# Patient Record
Sex: Female | Born: 1950 | Race: White | Hispanic: No | Marital: Married | Smoking: Never smoker
Health system: Southern US, Community
[De-identification: ages and names within clinical notes are randomized; demographics above are authoritative.]

## PROBLEM LIST (undated history)

## (undated) DIAGNOSIS — Z972 Presence of dental prosthetic device (complete) (partial): Secondary | ICD-10-CM

## (undated) DIAGNOSIS — C187 Malignant neoplasm of sigmoid colon: Secondary | ICD-10-CM

## (undated) DIAGNOSIS — M1990 Unspecified osteoarthritis, unspecified site: Secondary | ICD-10-CM

## (undated) DIAGNOSIS — Z9289 Personal history of other medical treatment: Secondary | ICD-10-CM

## (undated) DIAGNOSIS — K219 Gastro-esophageal reflux disease without esophagitis: Secondary | ICD-10-CM

## (undated) DIAGNOSIS — E119 Type 2 diabetes mellitus without complications: Secondary | ICD-10-CM

## (undated) DIAGNOSIS — N6022 Fibroadenosis of left breast: Secondary | ICD-10-CM

## (undated) DIAGNOSIS — J302 Other seasonal allergic rhinitis: Secondary | ICD-10-CM

## (undated) DIAGNOSIS — R002 Palpitations: Secondary | ICD-10-CM

## (undated) DIAGNOSIS — H269 Unspecified cataract: Secondary | ICD-10-CM

## (undated) HISTORY — DX: Type 2 diabetes mellitus without complications: E11.9

## (undated) HISTORY — PX: INGUINAL HERNIA REPAIR: SUR1180

## (undated) HISTORY — PX: APPENDECTOMY: SHX54

## (undated) HISTORY — PX: COLECTOMY: SHX59

## (undated) HISTORY — PX: HERNIA REPAIR: SHX51

## (undated) HISTORY — PX: COLONOSCOPY: SHX174

---

## 1998-01-26 ENCOUNTER — Ambulatory Visit (HOSPITAL_COMMUNITY): Admission: RE | Admit: 1998-01-26 | Discharge: 1998-01-26 | Payer: Self-pay | Admitting: Obstetrics & Gynecology

## 1998-09-14 ENCOUNTER — Other Ambulatory Visit: Admission: RE | Admit: 1998-09-14 | Discharge: 1998-09-14 | Payer: Self-pay | Admitting: Obstetrics & Gynecology

## 1999-11-01 ENCOUNTER — Other Ambulatory Visit: Admission: RE | Admit: 1999-11-01 | Discharge: 1999-11-01 | Payer: Self-pay | Admitting: Obstetrics & Gynecology

## 2000-05-09 DIAGNOSIS — Z9289 Personal history of other medical treatment: Secondary | ICD-10-CM

## 2000-05-09 HISTORY — DX: Personal history of other medical treatment: Z92.89

## 2000-06-13 ENCOUNTER — Encounter (INDEPENDENT_AMBULATORY_CARE_PROVIDER_SITE_OTHER): Payer: Self-pay | Admitting: Specialist

## 2000-06-13 ENCOUNTER — Observation Stay (HOSPITAL_COMMUNITY): Admission: RE | Admit: 2000-06-13 | Discharge: 2000-06-14 | Payer: Self-pay | Admitting: Obstetrics & Gynecology

## 2000-06-13 HISTORY — PX: MYOMECTOMY: SHX85

## 2000-06-13 HISTORY — PX: ENDOMETRIAL FULGURATION: SHX1500

## 2001-01-04 ENCOUNTER — Other Ambulatory Visit: Admission: RE | Admit: 2001-01-04 | Discharge: 2001-01-04 | Payer: Self-pay | Admitting: Obstetrics & Gynecology

## 2002-02-05 ENCOUNTER — Other Ambulatory Visit: Admission: RE | Admit: 2002-02-05 | Discharge: 2002-02-05 | Payer: Self-pay | Admitting: Obstetrics & Gynecology

## 2002-05-16 ENCOUNTER — Other Ambulatory Visit: Admission: RE | Admit: 2002-05-16 | Discharge: 2002-05-16 | Payer: Self-pay | Admitting: Obstetrics & Gynecology

## 2002-10-24 ENCOUNTER — Other Ambulatory Visit: Admission: RE | Admit: 2002-10-24 | Discharge: 2002-10-24 | Payer: Self-pay | Admitting: Obstetrics & Gynecology

## 2003-04-21 ENCOUNTER — Other Ambulatory Visit: Admission: RE | Admit: 2003-04-21 | Discharge: 2003-04-21 | Payer: Self-pay | Admitting: Obstetrics & Gynecology

## 2004-11-15 ENCOUNTER — Other Ambulatory Visit: Admission: RE | Admit: 2004-11-15 | Discharge: 2004-11-15 | Payer: Self-pay | Admitting: Obstetrics & Gynecology

## 2010-09-24 NOTE — Op Note (Signed)
[facility]  Patient:    Bethany Frederick, Bethany Frederick                     MRN: [id number] Proc. Date: 06/13/00 Adm. 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  OPERATION:                    Resection of pedunculated myoma and general endometrial curettage.  SURGEON:                      Freddy Finner, M.D.  ANESTHESIA:                   Intravenous sedation.  INTRAOPERATIVE COMPLICATIONS: None.  ESTIMATED INTRAOPERATIVE BLOOD LOSS:                   Less than 50 cc.  INDICATIONS:                  The patient is a 60 year old white married female who was having regular menses until today. She had the sudden onset of very heavy vaginal bleeding with passage of large clots and a couple of syncopal episodes at home. She was brought to the office where examination revealed an approximately 4 cm firm, nodular lesion protruding through the cervix. This was diagnosed as a pedunculated myoma, and she was brought to the hospital for removal of the myoma and D&C. Her preoperative hemoglobin was 12.   DESCRIPTION OF PROCEDURE:  She was brought to the operating room. Placed under adequate intravenous sedation. Placed in the dorsal lithotomy position. Betadine prep of the perineum and vagina was carried out in the usual fashion. Bivalve speculum was introduced. The large myoma was grasped with the ring forceps and twisted until it came off at the stalk. There was no visible residual stalk visible. General curettage was carried out with a Heaney curette, and exploration of the uterine cavity was carried out with Randall stone forceps. No additional lesions were palpable with either instrument. On completing the procedure,  hemostasis was adequate. The procedure was terminated. The patient was taken to the recovery room in good condition. She will be discharged after an overnight observation in the absence of complications. DD:  06/13/00 TD:  06/13/00 Job: [id number]

## 2011-05-10 HISTORY — PX: SHOULDER ARTHROSCOPY W/ ROTATOR CUFF REPAIR: SHX2400

## 2012-03-05 ENCOUNTER — Encounter (HOSPITAL_COMMUNITY): Payer: Self-pay | Admitting: Anesthesiology

## 2012-03-05 ENCOUNTER — Encounter (HOSPITAL_COMMUNITY): Payer: Self-pay

## 2012-03-05 ENCOUNTER — Encounter (HOSPITAL_COMMUNITY): Admission: EM | Disposition: A | Discharge status: Home / Self Care | Payer: Self-pay | Source: Home / Self Care

## 2012-03-05 ENCOUNTER — Observation Stay (HOSPITAL_COMMUNITY): Payer: Federal, State, Local not specified - PPO | Admitting: Anesthesiology

## 2012-03-05 ENCOUNTER — Observation Stay (HOSPITAL_COMMUNITY)
Admission: EM | Admit: 2012-03-05 | Discharge: 2012-03-06 | Disposition: A | Discharge status: Home / Self Care | Payer: Federal, State, Local not specified - PPO | Attending: General Surgery | Admitting: General Surgery

## 2012-03-05 DIAGNOSIS — K4030 Unilateral inguinal hernia, with obstruction, without gangrene, not specified as recurrent: Principal | ICD-10-CM | POA: Insufficient documentation

## 2012-03-05 DIAGNOSIS — K460 Unspecified abdominal hernia with obstruction, without gangrene: Secondary | ICD-10-CM

## 2012-03-05 DIAGNOSIS — K4090 Unilateral inguinal hernia, without obstruction or gangrene, not specified as recurrent: Secondary | ICD-10-CM

## 2012-03-05 HISTORY — DX: Unspecified osteoarthritis, unspecified site: M19.90

## 2012-03-05 HISTORY — PX: INSERTION OF MESH: SHX5868

## 2012-03-05 HISTORY — PX: INGUINAL HERNIA REPAIR: SHX194

## 2012-03-05 LAB — CBC WITH DIFFERENTIAL/PLATELET
Basophils Absolute: 0 10*3/uL (ref 0.0–0.1)
Eosinophils Absolute: 0.1 10*3/uL (ref 0.0–0.7)
Eosinophils Relative: 1 % (ref 0–5)
Lymphocytes Relative: 19 % (ref 12–46)
MCV: 86.8 fL (ref 78.0–100.0)
Platelets: 159 10*3/uL (ref 150–400)
RDW: 14.2 % (ref 11.5–15.5)
WBC: 6.5 10*3/uL (ref 4.0–10.5)

## 2012-03-05 LAB — COMPREHENSIVE METABOLIC PANEL
ALT: 19 U/L (ref 0–35)
AST: 17 U/L (ref 0–37)
Calcium: 9.2 mg/dL (ref 8.4–10.5)
Sodium: 139 mEq/L (ref 135–145)
Total Protein: 6.3 g/dL (ref 6.0–8.3)

## 2012-03-05 SURGERY — REPAIR, HERNIA, INGUINAL, INCARCERATED
Anesthesia: General | Site: Groin

## 2012-03-05 MED ORDER — BUPIVACAINE HCL 0.25 % IJ SOLN
INTRAMUSCULAR | Status: DC | PRN
  Administered 2012-03-05: 6 mL

## 2012-03-05 MED ORDER — HYDROMORPHONE HCL PF 1 MG/ML IJ SOLN: 1.0000 mg | INTRAMUSCULAR | Status: DC | PRN

## 2012-03-05 MED ORDER — HYDROMORPHONE HCL PF 1 MG/ML IJ SOLN
1.0000 mg | Freq: Once | INTRAMUSCULAR | Status: AC
  Administered 2012-03-05: 1 mg via INTRAVENOUS
  Filled 2012-03-05: qty 1

## 2012-03-05 MED ORDER — DEXTROSE IN LACTATED RINGERS 5 % IV SOLN
INTRAVENOUS | Status: DC
  Administered 2012-03-05: 1000 mL via INTRAVENOUS
  Administered 2012-03-06: 04:00:00 via INTRAVENOUS

## 2012-03-05 MED ORDER — ONDANSETRON HCL 4 MG PO TABS: 4.0000 mg | ORAL_TABLET | Freq: Four times a day (QID) | ORAL | Status: DC | PRN

## 2012-03-05 MED ORDER — 0.9 % SODIUM CHLORIDE (POUR BTL) OPTIME
TOPICAL | Status: DC | PRN
  Administered 2012-03-05: 1000 mL

## 2012-03-05 MED ORDER — OXYCODONE HCL 5 MG/5ML PO SOLN: 5.0000 mg | Freq: Once | ORAL | Status: DC | PRN

## 2012-03-05 MED ORDER — CEFAZOLIN SODIUM-DEXTROSE 2-3 GM-% IV SOLR
2.0000 g | Freq: Three times a day (TID) | INTRAVENOUS | Status: DC
  Administered 2012-03-05 – 2012-03-06 (×2): 2 g via INTRAVENOUS
  Filled 2012-03-05 (×5): qty 50

## 2012-03-05 MED ORDER — CEFAZOLIN SODIUM-DEXTROSE 2-3 GM-% IV SOLR
INTRAVENOUS | Status: DC | PRN
  Administered 2012-03-05: 2 g via INTRAVENOUS

## 2012-03-05 MED ORDER — OXYCODONE-ACETAMINOPHEN 5-325 MG PO TABS: 1.0000 | ORAL_TABLET | ORAL | Status: DC | PRN

## 2012-03-05 MED ORDER — BUPIVACAINE HCL (PF) 0.25 % IJ SOLN
INTRAMUSCULAR | Status: AC
  Filled 2012-03-05: qty 30

## 2012-03-05 MED ORDER — HYDROMORPHONE HCL PF 1 MG/ML IJ SOLN: 0.2500 mg | INTRAMUSCULAR | Status: DC | PRN

## 2012-03-05 MED ORDER — PROMETHAZINE HCL 25 MG/ML IJ SOLN: 6.2500 mg | INTRAMUSCULAR | Status: DC | PRN

## 2012-03-05 MED ORDER — KETOROLAC TROMETHAMINE 60 MG/2ML IM SOLN
INTRAMUSCULAR | Status: DC | PRN
  Administered 2012-03-05: 30 mg via INTRAMUSCULAR

## 2012-03-05 MED ORDER — SUCCINYLCHOLINE CHLORIDE 20 MG/ML IJ SOLN
INTRAMUSCULAR | Status: DC | PRN
  Administered 2012-03-05: 140 mg via INTRAVENOUS

## 2012-03-05 MED ORDER — ONDANSETRON HCL 4 MG/2ML IJ SOLN
INTRAMUSCULAR | Status: DC | PRN
  Administered 2012-03-05: 4 mg via INTRAVENOUS

## 2012-03-05 MED ORDER — ONDANSETRON HCL 4 MG/2ML IJ SOLN
4.0000 mg | INTRAMUSCULAR | Status: DC | PRN
  Administered 2012-03-05: 4 mg via INTRAVENOUS
  Filled 2012-03-05: qty 2

## 2012-03-05 MED ORDER — CEFAZOLIN SODIUM-DEXTROSE 2-3 GM-% IV SOLR
INTRAVENOUS | Status: AC
  Filled 2012-03-05: qty 50

## 2012-03-05 MED ORDER — LIDOCAINE HCL (CARDIAC) 20 MG/ML IV SOLN
INTRAVENOUS | Status: DC | PRN
  Administered 2012-03-05: 100 mg via INTRAVENOUS

## 2012-03-05 MED ORDER — PHENYLEPHRINE HCL 10 MG/ML IJ SOLN
INTRAMUSCULAR | Status: DC | PRN
  Administered 2012-03-05 (×2): 40 ug via INTRAVENOUS

## 2012-03-05 MED ORDER — NEOSTIGMINE METHYLSULFATE 1 MG/ML IJ SOLN
INTRAMUSCULAR | Status: DC | PRN
  Administered 2012-03-05: 4 mg via INTRAVENOUS

## 2012-03-05 MED ORDER — OXYCODONE HCL 5 MG PO TABS: 5.0000 mg | ORAL_TABLET | Freq: Once | ORAL | Status: DC | PRN

## 2012-03-05 MED ORDER — PROPOFOL 10 MG/ML IV BOLUS
INTRAVENOUS | Status: DC | PRN
  Administered 2012-03-05: 160 mg via INTRAVENOUS

## 2012-03-05 MED ORDER — MORPHINE SULFATE 4 MG/ML IJ SOLN
8.0000 mg | Freq: Once | INTRAMUSCULAR | Status: AC
  Administered 2012-03-05: 8 mg via INTRAVENOUS
  Filled 2012-03-05: qty 2

## 2012-03-05 MED ORDER — HYDROMORPHONE HCL PF 1 MG/ML IJ SOLN: 0.5000 mg | INTRAMUSCULAR | Status: DC | PRN

## 2012-03-05 MED ORDER — ONDANSETRON HCL 4 MG/2ML IJ SOLN: 4.0000 mg | Freq: Four times a day (QID) | INTRAMUSCULAR | Status: DC | PRN

## 2012-03-05 MED ORDER — MIDAZOLAM HCL 5 MG/5ML IJ SOLN
INTRAMUSCULAR | Status: DC | PRN
  Administered 2012-03-05: 2 mg via INTRAVENOUS

## 2012-03-05 MED ORDER — LACTATED RINGERS IV SOLN
INTRAVENOUS | Status: DC | PRN
  Administered 2012-03-05 (×2): via INTRAVENOUS

## 2012-03-05 MED ORDER — GLYCOPYRROLATE 0.2 MG/ML IJ SOLN
INTRAMUSCULAR | Status: DC | PRN
  Administered 2012-03-05: 0.2 mg via INTRAVENOUS
  Administered 2012-03-05: 0.6 mg via INTRAVENOUS

## 2012-03-05 MED ORDER — PANTOPRAZOLE SODIUM 40 MG IV SOLR
40.0000 mg | Freq: Every day | INTRAVENOUS | Status: DC
  Administered 2012-03-05: 40 mg via INTRAVENOUS
  Filled 2012-03-05 (×2): qty 40

## 2012-03-05 MED ORDER — EPHEDRINE SULFATE 50 MG/ML IJ SOLN
INTRAMUSCULAR | Status: DC | PRN
  Administered 2012-03-05 (×2): 10 mg via INTRAVENOUS

## 2012-03-05 MED ORDER — FENTANYL CITRATE 0.05 MG/ML IJ SOLN
INTRAMUSCULAR | Status: DC | PRN
  Administered 2012-03-05 (×5): 50 ug via INTRAVENOUS

## 2012-03-05 MED ORDER — LACTATED RINGERS IV SOLN
INTRAVENOUS | Status: DC
  Administered 2012-03-05: 11:00:00 via INTRAVENOUS

## 2012-03-05 MED ORDER — ROCURONIUM BROMIDE 100 MG/10ML IV SOLN
INTRAVENOUS | Status: DC | PRN
  Administered 2012-03-05: 40 mg via INTRAVENOUS

## 2012-03-05 SURGICAL SUPPLY — 59 items
ADH SKN CLS APL DERMABOND .7 (GAUZE/BANDAGES/DRESSINGS) ×2
APL SKNCLS STERI-STRIP NONHPOA (GAUZE/BANDAGES/DRESSINGS)
BENZOIN TINCTURE PRP APPL 2/3 (GAUZE/BANDAGES/DRESSINGS) IMPLANT
BLADE SURG 15 STRL LF DISP TIS (BLADE) ×2 IMPLANT
BLADE SURG 15 STRL SS (BLADE) ×3
BLADE SURG ROTATE 9660 (MISCELLANEOUS) ×1 IMPLANT
CHLORAPREP W/TINT 26ML (MISCELLANEOUS) ×3 IMPLANT
CLOTH BEACON ORANGE TIMEOUT ST (SAFETY) ×3 IMPLANT
COVER SURGICAL LIGHT HANDLE (MISCELLANEOUS) ×3 IMPLANT
DECANTER SPIKE VIAL GLASS SM (MISCELLANEOUS) ×2 IMPLANT
DERMABOND ADVANCED (GAUZE/BANDAGES/DRESSINGS) ×1
DERMABOND ADVANCED .7 DNX12 (GAUZE/BANDAGES/DRESSINGS) ×2 IMPLANT
DRAIN PENROSE 1/2X12 LTX STRL (WOUND CARE) IMPLANT
DRAPE LAPAROSCOPIC ABDOMINAL (DRAPES) ×1 IMPLANT
DRAPE LAPAROTOMY TRNSV 102X78 (DRAPE) IMPLANT
DRAPE UTILITY 15X26 W/TAPE STR (DRAPE) ×6 IMPLANT
DRSG TEGADERM 4X4.75 (GAUZE/BANDAGES/DRESSINGS) ×2 IMPLANT
ELECT CAUTERY BLADE 6.4 (BLADE) ×3 IMPLANT
ELECT REM PT RETURN 9FT ADLT (ELECTROSURGICAL) ×3
ELECTRODE REM PT RTRN 9FT ADLT (ELECTROSURGICAL) ×2 IMPLANT
GAUZE SPONGE 4X4 16PLY XRAY LF (GAUZE/BANDAGES/DRESSINGS) ×5 IMPLANT
GLOVE BIO SURGEON STRL SZ7.5 (GLOVE) ×3 IMPLANT
GLOVE BIOGEL PI IND STRL 7.0 (GLOVE) ×1 IMPLANT
GLOVE BIOGEL PI IND STRL 8 (GLOVE) ×2 IMPLANT
GLOVE BIOGEL PI INDICATOR 7.0 (GLOVE) ×1
GLOVE BIOGEL PI INDICATOR 8 (GLOVE) ×1
GLOVE SURG SS PI 7.0 STRL IVOR (GLOVE) ×2 IMPLANT
GOWN STRL NON-REIN LRG LVL3 (GOWN DISPOSABLE) ×6 IMPLANT
KIT BASIN OR (CUSTOM PROCEDURE TRAY) ×3 IMPLANT
KIT ROOM TURNOVER OR (KITS) ×3 IMPLANT
MESH ULTRAPRO 6X6 15CM15CM (Mesh General) ×2 IMPLANT
NDL HYPO 25GX1X1/2 BEV (NEEDLE) ×2 IMPLANT
NEEDLE HYPO 25GX1X1/2 BEV (NEEDLE) ×3 IMPLANT
NS IRRIG 1000ML POUR BTL (IV SOLUTION) ×3 IMPLANT
PACK SURGICAL SETUP 50X90 (CUSTOM PROCEDURE TRAY) ×3 IMPLANT
PAD ARMBOARD 7.5X6 YLW CONV (MISCELLANEOUS) ×3 IMPLANT
PENCIL BUTTON HOLSTER BLD 10FT (ELECTRODE) ×3 IMPLANT
SPECIMEN JAR SMALL (MISCELLANEOUS) IMPLANT
SPONGE GAUZE 4X4 12PLY (GAUZE/BANDAGES/DRESSINGS) ×2 IMPLANT
SPONGE INTESTINAL PEANUT (DISPOSABLE) ×2 IMPLANT
STRIP CLOSURE SKIN 1/2X4 (GAUZE/BANDAGES/DRESSINGS) IMPLANT
SUT MNCRL AB 4-0 PS2 18 (SUTURE) ×3 IMPLANT
SUT PDS AB 0 CT 36 (SUTURE) IMPLANT
SUT PROLENE 2 0 SH DA (SUTURE) ×3 IMPLANT
SUT SILK 2 0 SH (SUTURE) IMPLANT
SUT SILK 3 0 (SUTURE) ×3
SUT SILK 3-0 18XBRD TIE 12 (SUTURE) ×2 IMPLANT
SUT VIC AB 0 CT2 27 (SUTURE) IMPLANT
SUT VIC AB 2-0 SH 27 (SUTURE) ×3
SUT VIC AB 2-0 SH 27X BRD (SUTURE) ×2 IMPLANT
SUT VIC AB 3-0 SH 27 (SUTURE) ×9
SUT VIC AB 3-0 SH 27X BRD (SUTURE) ×2 IMPLANT
SUT VIC AB 3-0 SH 27XBRD (SUTURE) ×2 IMPLANT
SUT VICRYL AB 3 0 TIES (SUTURE) ×1 IMPLANT
SYR CONTROL 10ML LL (SYRINGE) ×3 IMPLANT
TOWEL OR 17X24 6PK STRL BLUE (TOWEL DISPOSABLE) ×3 IMPLANT
TOWEL OR 17X26 10 PK STRL BLUE (TOWEL DISPOSABLE) ×3 IMPLANT
TUBE CONNECTING 12X1/4 (SUCTIONS) ×1 IMPLANT
YANKAUER SUCT BULB TIP NO VENT (SUCTIONS) ×2 IMPLANT

## 2012-03-05 NOTE — Op Note (Signed)
Pre Operative Diagnosis:  Incarcerated left inguinal hernia  Post Operative Diagnosis: incarcerated left indirect hernia  Procedure: Open left inguinal hernia repair with mesh.  Surgeon: Dr. Axel Filler  Assistant: none  Anesthesia: GETA  EBL: 10 cc  Complications: none  Counts: reported as correct x 2  Findings:  Patient in the right lateral hernia with hernia sac contained mainly fluid as well as omentum. There were no signs of bowel incarceration  Indications for procedure:  The patient is a 61 year old female with a close prior history of left ankle hernia and denies abdominal pain. Patient presented ED with signs consistent with incarcerated inguinal hernia with possible small bowel incarceration. This patient taken back urgently for left inguinal hernia prepared  Details of the procedure: after the patient was consented patient was taken back to the operative patient was then placed in supine position bilateral SCDs in place. After appropriate antibiotics were confirmed, a timeout was called and all facts were verified.  A 4 cm incision was made in the left inguinal area. Dissection was carried down using Bovie cautery maintaining hemostasis down through Scarpa's fascia to the anterior abdominal wall. The hernia sac was initially encountered and external obliques were essentially obliterated secondary the chronicity of her hernia.  The hernia sac was identified and he was seen to be fluid filled. The hernia sac was incised and clear fluid was expressed there is a small amount of omentum and there was no small bowel within the hernia sac or other organs. He was ultimately identified and this was suture ligated. The ilioinguinal nerve was also identified and dissected back below the external obliques which was incised laterally and tied off using 3-0 Vicryl proximally and distally. The internal inguinal ring was reapproximated using 3-0 Vicryl in a figure-of-eight fashion to retain the  hernia sac.  Once this was done the superior and inferior external cleaned off until the Cooper's ligament was identified inferiorly and medially. At this time a piece of ultra Pro mesh was cut to shape proximally and 6 cm x 2 cm. A total Prolene was used to anchor the stitch at Cooper's ligament and each end of the Prolene was then run along the shelving edge of the external oblique and along Poupart's ligament. The tails were then tucked underneath the external oblique. The external oblique was reapproximated with 3-0 Vicryl in running fashion.  1/4% Marcaine was used to infiltrate the subcutaneous and fascial area.The fascia was reapproximated a 3-0 Vicryl in a interrupted fashion.  The skin was reapproximated with 4 Monocryl in subcuticular fashion. The skin was then dressed with Dermabond..  The patient was taken to the recovery room in stable condition.

## 2012-03-05 NOTE — ED Provider Notes (Signed)
Medical screening examination/treatment/procedure(s) were conducted as a shared visit with non-physician practitioner(s) and myself.  I personally evaluated the patient during the encounter    Nelia Shi, MD 03/05/12 [phone]

## 2012-03-05 NOTE — Preoperative (Signed)
Beta Blockers   Reason not to administer Beta Blockers:Not Applicable 

## 2012-03-05 NOTE — Anesthesia Procedure Notes (Signed)
Performed by: Aarianna Hoadley M       

## 2012-03-05 NOTE — ED Provider Notes (Signed)
History     CSN: [id number]  Arrival date & time 03/05/12  4098   First MD Initiated Contact with Patient 03/05/12 [phone]      Chief Complaint  Patient presents with  . Abdominal Injury    (Consider location/radiation/quality/duration/timing/severity/associated sxs/prior treatment) HPI Comments: 61 yo female with a history of left inguinal hernia, appendectomy and removal of pedunculated myoma presents to the emergency department with chief complaint of lower abdominal pain.  Onset of symptoms began acutely this morning around 630 a.m.  Pain is primarily located in the left lower quadrant without radiation and rated as severe 10/10. Pain described as sharp/dull and constant. Associated symptoms include nausea.  Last bowel movement was this morning.  Patient denies emesis, fever, night sweats, chills, trauma, melena, syncope, dizziness, lower back pain, chest pain, shortness of breath, dysuria, vaginal bleeding or vaginal discharge.  The history is provided by the patient.    History reviewed. No pertinent past medical history.  History reviewed. No pertinent past surgical history.  History reviewed. No pertinent family history.  History  Substance Use Topics  . Smoking status: Never Smoker   . Smokeless tobacco: Not on file  . Alcohol Use: Yes    OB History    Grav Para Term Preterm Abortions TAB SAB Ect Mult Living                  Review of Systems  Constitutional: Negative for fever, chills and appetite change.  HENT: Negative for congestion.   Eyes: Negative for visual disturbance.  Respiratory: Negative for shortness of breath.   Cardiovascular: Negative for chest pain and leg swelling.  Gastrointestinal: Positive for nausea and abdominal pain. Negative for vomiting, diarrhea, constipation, blood in stool and anal bleeding.  Genitourinary: Negative for dysuria, urgency and frequency.  Musculoskeletal: Negative for back pain.  Neurological: Negative for dizziness,  syncope, weakness, light-headedness, numbness and headaches.  Psychiatric/Behavioral: Negative for confusion.  All other systems reviewed and are negative.    Allergies  Review of patient's allergies indicates no known allergies.  Home Medications  No current outpatient prescriptions on file.  BP 124/63  Pulse 66  Temp 97.7 F (36.5 C) (Oral)  Resp 14  SpO2 94%  Physical Exam  Constitutional: She is oriented to person, place, and time. She appears well-developed and well-nourished. No distress.  HENT:  Head: Normocephalic and atraumatic.  Mouth/Throat: Oropharynx is clear and moist. No oropharyngeal exudate.  Eyes: Conjunctivae normal and EOM are normal. Pupils are equal, round, and reactive to light. No scleral icterus.  Neck: Normal range of motion. Neck supple. No tracheal deviation present. No thyromegaly present.  Cardiovascular: Regular rhythm, normal heart sounds and intact distal pulses.        Tachycardia   Pulmonary/Chest: Effort normal and breath sounds normal. No stridor. No respiratory distress. She has no wheezes.  Abdominal: Soft. A hernia is present. Hernia confirmed positive in the left inguinal area.         Tennis ball sized mass in the inguinal area, c/w hernia  Musculoskeletal: Normal range of motion. She exhibits no edema and no tenderness.  Neurological: She is alert and oriented to person, place, and time. Coordination normal.  Skin: Skin is warm and dry. No rash noted. She is not diaphoretic. No erythema. No pallor.  Psychiatric: She has a normal mood and affect. Her behavior is normal.    ED Course  Hernia reduction Date/Time: 03/05/2012 8:22 AM Performed by: Jaci Carrel Authorized by: Drue Novel,  Shepard Keltz Consent: Verbal consent obtained. Risks and benefits: risks, benefits and alternatives were discussed Consent given by: patient Patient understanding: patient states understanding of the procedure being performed Patient consent: the patient's  understanding of the procedure matches consent given Patient identity confirmed: verbally with patient and arm band Local anesthesia used: no Patient sedated: no Comments: Unable to reduce hernia, incarcerated. No evidence of strangulation. Will consult general surgery.    (including critical care time)  Labs Reviewed  COMPREHENSIVE METABOLIC PANEL - Abnormal; Notable for the following:    Glucose, Bld 127 (*)     Alkaline Phosphatase 147 (*)     All other components within normal limits  CBC WITH DIFFERENTIAL  URINALYSIS, ROUTINE W REFLEX MICROSCOPIC   No results found.  EKG: normal EKG, normal sinus rhythm, there are no previous tracings available for comparison, left axis deviation.  No diagnosis found.  Consult Surgery: Will see in ED for repeat attempt of hernia reduction vs OR. Pt kept NPO and pain managed  MDM  Incarcerated hernia  61 yo w acute onset of abd pain & hx of left inguinal hernia. Unable to reduce in ER, will be admitted by surgery. The patient appears reasonably stabilized for admission considering the current resources, flow, and capabilities available in the ED at this time, and I doubt any other [facility] requiring further screening and/or treatment in the ED prior to admission. Pt seen w Radford Pax who agrees w plan.          Jaci Carrel, [place] 03/05/12 [phone]

## 2012-03-05 NOTE — H&P (Signed)
Bethany Frederick is an 61 y.o. female.   Chief Complaint: abdominal pain left inguinal hernia HPI: the patient 66-year-old female with a two-week history of hernia. He states that this morning she began abdominal pain was generalized with a bulge in the left side. Patient states the hernia previously never been incarcerated was first occurrence. Patient had some nausea with no vomiting, and no blood per rectum.  History reviewed. No pertinent past medical history.  History reviewed. No pertinent past surgical history.  History reviewed. No pertinent family history. Social History:  reports that she has never smoked. She does not have any smokeless tobacco history on file. She reports that she drinks alcohol. She reports that she does not use illicit drugs.  Allergies: No Known Allergies   (Not in a hospital admission)  Results for orders placed during the hospital encounter of 03/05/12 (from the past 48 hour(s))  CBC WITH DIFFERENTIAL     Status: Normal   Collection Time   03/05/12  8:09 AM      Component Value Range Comment   WBC 6.5  4.0 - 10.5 K/uL    RBC 4.69  3.87 - 5.11 MIL/uL    Hemoglobin 13.6  12.0 - 15.0 g/dL    HCT 16.1  09.6 - 04.5 %    MCV 86.8  78.0 - 100.0 fL    MCH 29.0  26.0 - 34.0 pg    MCHC 33.4  30.0 - 36.0 g/dL    RDW 40.9  81.1 - 91.4 %    Platelets 159  150 - 400 K/uL    Neutrophils Relative 73  43 - 77 %    Neutro Abs 4.7  1.7 - 7.7 K/uL    Lymphocytes Relative 19  12 - 46 %    Lymphs Abs 1.2  0.7 - 4.0 K/uL    Monocytes Relative 7  3 - 12 %    Monocytes Absolute 0.5  0.1 - 1.0 K/uL    Eosinophils Relative 1  0 - 5 %    Eosinophils Absolute 0.1  0.0 - 0.7 K/uL    Basophils Relative 1  0 - 1 %    Basophils Absolute 0.0  0.0 - 0.1 K/uL    No results found.  Review of Systems  Constitutional: Negative for fever.  HENT: Negative.   Eyes: Negative.   Respiratory: Negative.   Cardiovascular: Negative.   Gastrointestinal: Positive for nausea and  abdominal pain. Negative for diarrhea.  Musculoskeletal: Negative.   Skin: Negative.   Neurological: Negative.     Blood pressure 124/63, pulse 66, temperature 97.7 F (36.5 C), temperature source Oral, resp. rate 14, SpO2 94.00%. Physical Exam  Constitutional: She is oriented to person, place, and time. She appears well-developed and well-nourished.  HENT:  Head: Normocephalic and atraumatic.  Eyes: Conjunctivae normal and EOM are normal. Pupils are equal, round, and reactive to light.  Neck: Normal range of motion. Neck supple.  Cardiovascular: Normal rate and regular rhythm.   Respiratory: Effort normal.  GI: Soft. Bowel sounds are normal. A hernia is present. Hernia confirmed positive in the left inguinal area (incarcerated). Hernia confirmed negative in the right inguinal area.  Musculoskeletal: Normal range of motion.  Neurological: She is alert and oriented to person, place, and time.     Assessment/Plan 61 year old female with incarcerated left inguinal hernia.  1. The proceed to the operating room for left inguinal hernia repair with possible small bowel resection. 2.All risks and benefits were discussed  with the patient, to generally include infection, bleeding, damage to surrounding structures, and recurrence. Alternatives were offered and described.  All questions were answered and the patient voiced understanding of the procedure and wishes to proceed at this point.   Marigene Ehlers., Ehab Humber 03/05/2012, 8:44 AM

## 2012-03-05 NOTE — ED Notes (Signed)
Pt with c/o sudden onset lower abdominal pain with nausea

## 2012-03-05 NOTE — Transfer of Care (Signed)
Immediate Anesthesia Transfer of Care Note  Patient: Bethany Frederick [facility]  Procedure(s) Performed: Procedure(s) (LRB) with comments: HERNIA REPAIR INGUINAL INCARCERATED (N/A) INSERTION OF MESH (Left)  Patient Location: PACU  Anesthesia Type:General  Level of Consciousness: awake, alert  and oriented  Airway & Oxygen Therapy: Patient Spontanous Breathing  Post-op Assessment: Report given to PACU RN  Post vital signs: stable  Complications: No apparent anesthesia complications

## 2012-03-05 NOTE — Anesthesia Preprocedure Evaluation (Addendum)
Anesthesia Evaluation  Patient identified by MRN, date of birth, ID band Patient awake    Reviewed: Allergy & Precautions, H&P , NPO status , Patient's Chart, lab work & pertinent test results, reviewed documented beta blocker date and time   Airway Mallampati: III TM Distance: >3 FB Neck ROM: Full    Dental  (+) Teeth Intact and Dental Advisory Given   Pulmonary neg pulmonary ROS,  breath sounds clear to auscultation  Pulmonary exam normal       Cardiovascular Exercise Tolerance: Good Rhythm:Regular Rate:Normal     Neuro/Psych negative neurological ROS     GI/Hepatic negative GI ROS, Neg liver ROS,   Endo/Other  negative endocrine ROS  Renal/GU negative Renal ROS  negative genitourinary   Musculoskeletal negative musculoskeletal ROS (+)   Abdominal (+)  Abdomen: soft. Bowel sounds: normal.  Peds  Hematology negative hematology ROS (+)   Anesthesia Other Findings   Reproductive/Obstetrics negative OB ROS                        Anesthesia Physical Anesthesia Plan  ASA: II and Emergent  Anesthesia Plan: General   Post-op Pain Management:    Induction: Intravenous  Airway Management Planned: Oral ETT  Additional Equipment:   Intra-op Plan:   Post-operative Plan: Extubation in OR  Informed Consent: I have reviewed the patients History and Physical, chart, labs and discussed the procedure including the risks, benefits and alternatives for the proposed anesthesia with the patient or authorized representative who has indicated his/her understanding and acceptance.   Dental advisory given  Plan Discussed with: CRNA, Anesthesiologist and Surgeon  Anesthesia Plan Comments:         Anesthesia Quick Evaluation

## 2012-03-05 NOTE — Anesthesia Postprocedure Evaluation (Signed)
Anesthesia Post Note  Patient: Bethany Frederick [facility]  Procedure(s) Performed: Procedure(s) (LRB): HERNIA REPAIR INGUINAL INCARCERATED (N/A) INSERTION OF MESH (Left)  Anesthesia type: general  Patient location: PACU  Post pain: Pain level controlled  Post assessment: Patient's Cardiovascular Status Stable  Last Vitals:  Filed Vitals:   03/05/12 1448  BP: 126/62  Pulse: 90  Temp:   Resp: 14    Post vital signs: Reviewed and stable  Level of consciousness: sedated  Complications: No apparent anesthesia complications

## 2012-03-06 LAB — CBC
Hemoglobin: 12 g/dL (ref 12.0–15.0)
MCH: 28.9 pg (ref 26.0–34.0)
RBC: 4.15 MIL/uL (ref 3.87–5.11)

## 2012-03-06 MED ORDER — OXYCODONE-ACETAMINOPHEN 5-325 MG PO TABS: 1.0000 | ORAL_TABLET | ORAL | Status: DC | PRN

## 2012-03-06 NOTE — Progress Notes (Signed)
Patient discharged to home in care of spouse. Medications and instructions reviewed with patient and spouse with no questions. IV d/c'd with cath intact Dressing CDI. Incision CDI. Assessment unchanged from this am. Patient is to follow up with Dr. Derrell Lolling in 3 weeks.

## 2012-03-06 NOTE — Discharge Summary (Signed)
  Patient ID: Bethany Frederick MRN: [id number] DOB/AGE: 62/05/1950 62 y.o.  Admit date: 03/05/2012 Discharge date: 03/06/2012  Procedures: open left inguinal hernia repair with mesh  Consults: None  Reason for Admission: This is a 61 yo female who came to [facility] with an incarcerated left inguinal hernia.  She was admitted for further care.  Admission Diagnoses:  1. Left incarcerated inguinal hernia  Hospital Course: The patient was admitted and taken to the operating room where she underwent an open left inguinal hernia repair with mesh.  She tolerated the procedure well.  By POD# 1, her pain was well controlled and she was tolerating a regular diet.  She was stable for dc home.  PE: Abd: soft, appropriately tender around incision, +BS, ND, incision c/d/i with dermabond  Discharge Diagnoses:  1. Incarcerated left inguinal hernia 2. S/p repair with mesh  Discharge Medications:   Medication List     As of 03/06/2012  8:08 AM    TAKE these medications         estradiol 1 MG tablet   Commonly known as: ESTRACE   Take 1 mg by mouth daily.      ibuprofen 200 MG tablet   Commonly known as: ADVIL,MOTRIN   Take 400 mg by mouth every 6 (six) hours as needed. For pain      oxyCODONE-acetaminophen 5-325 MG per tablet   Commonly known as: PERCOCET/ROXICET   Take 1-2 tablets by mouth every 4 (four) hours as needed.        Discharge Instructions:     Follow-up Information    Follow up with Lajean Saver, MD. Schedule an appointment as soon as possible for a visit in 3 weeks.   Contact information:   [address] [place] [address] [phone]          Signed: Letha Cape 03/06/2012, 8:08 AM

## 2012-03-08 ENCOUNTER — Encounter (HOSPITAL_COMMUNITY): Payer: Self-pay | Admitting: General Surgery

## 2012-03-27 ENCOUNTER — Ambulatory Visit (INDEPENDENT_AMBULATORY_CARE_PROVIDER_SITE_OTHER): Payer: Federal, State, Local not specified - PPO | Admitting: General Surgery

## 2012-03-27 ENCOUNTER — Encounter (INDEPENDENT_AMBULATORY_CARE_PROVIDER_SITE_OTHER): Payer: Self-pay | Admitting: General Surgery

## 2012-03-27 VITALS — BP 128/72 | HR 81 | Temp 98.1°F | Resp 18 | Ht 67.0 in | Wt 167.6 lb

## 2012-03-27 DIAGNOSIS — Z8719 Personal history of other diseases of the digestive system: Secondary | ICD-10-CM

## 2012-03-27 DIAGNOSIS — Z9889 Other specified postprocedural states: Secondary | ICD-10-CM

## 2012-03-27 NOTE — Progress Notes (Signed)
Patient ID: Bethany Frederick, female   DOB: January 09, 1951, 61 y.o.   MRN: [id number] The patient is a 61 year old female status post left repair who was seen in the ED for incarcerated left he will hernia repair. Patient is to do well postoperatively. She's been tolerating a regular diet and having no pain at this time. She does describe an area that is insensate over the incisional area   on exam: Wounds clean dry and intact there is no hernia  Plan: Status post left inguinal hernia with Mesh. Recommend  15-20 pound weight limit for the next 2 weeks. Patient fell apparent

## 2012-05-22 ENCOUNTER — Other Ambulatory Visit: Payer: Self-pay | Admitting: Obstetrics & Gynecology

## 2012-05-22 DIAGNOSIS — R928 Other abnormal and inconclusive findings on diagnostic imaging of breast: Secondary | ICD-10-CM

## 2012-06-01 ENCOUNTER — Ambulatory Visit
Admission: RE | Admit: 2012-06-01 | Discharge: 2012-06-01 | Disposition: A | Discharge status: Home / Self Care | Payer: Federal, State, Local not specified - PPO | Source: Ambulatory Visit | Attending: Obstetrics & Gynecology | Admitting: Obstetrics & Gynecology

## 2012-06-01 DIAGNOSIS — R928 Other abnormal and inconclusive findings on diagnostic imaging of breast: Secondary | ICD-10-CM

## 2012-06-23 ENCOUNTER — Other Ambulatory Visit: Payer: Self-pay

## 2013-03-14 ENCOUNTER — Other Ambulatory Visit: Payer: Self-pay

## 2014-05-09 HISTORY — PX: BREAST BIOPSY: SHX20

## 2014-10-09 DIAGNOSIS — H6242 Otitis externa in other diseases classified elsewhere, left ear: Secondary | ICD-10-CM | POA: Diagnosis not present

## 2014-10-09 DIAGNOSIS — B369 Superficial mycosis, unspecified: Secondary | ICD-10-CM | POA: Diagnosis not present

## 2014-11-04 DIAGNOSIS — Z124 Encounter for screening for malignant neoplasm of cervix: Secondary | ICD-10-CM | POA: Diagnosis not present

## 2014-11-04 DIAGNOSIS — Z6830 Body mass index (BMI) 30.0-30.9, adult: Secondary | ICD-10-CM | POA: Diagnosis not present

## 2014-11-04 DIAGNOSIS — Z1231 Encounter for screening mammogram for malignant neoplasm of breast: Secondary | ICD-10-CM | POA: Diagnosis not present

## 2014-11-04 DIAGNOSIS — Z01419 Encounter for gynecological examination (general) (routine) without abnormal findings: Secondary | ICD-10-CM | POA: Diagnosis not present

## 2014-11-11 ENCOUNTER — Other Ambulatory Visit: Payer: Self-pay | Admitting: Obstetrics & Gynecology

## 2014-11-11 DIAGNOSIS — R928 Other abnormal and inconclusive findings on diagnostic imaging of breast: Secondary | ICD-10-CM

## 2014-12-11 ENCOUNTER — Ambulatory Visit
Admission: RE | Admit: 2014-12-11 | Discharge: 2014-12-11 | Disposition: A | Discharge status: Home / Self Care | Payer: Federal, State, Local not specified - PPO | Source: Ambulatory Visit | Attending: Obstetrics & Gynecology | Admitting: Obstetrics & Gynecology

## 2014-12-11 ENCOUNTER — Other Ambulatory Visit: Payer: Self-pay | Admitting: Obstetrics & Gynecology

## 2014-12-11 DIAGNOSIS — R928 Other abnormal and inconclusive findings on diagnostic imaging of breast: Secondary | ICD-10-CM

## 2014-12-19 ENCOUNTER — Ambulatory Visit
Admission: RE | Admit: 2014-12-19 | Discharge: 2014-12-19 | Disposition: A | Discharge status: Home / Self Care | Payer: Federal, State, Local not specified - PPO | Source: Ambulatory Visit | Attending: Obstetrics & Gynecology | Admitting: Obstetrics & Gynecology

## 2014-12-19 ENCOUNTER — Other Ambulatory Visit: Payer: Self-pay | Admitting: Obstetrics & Gynecology

## 2014-12-19 DIAGNOSIS — N6489 Other specified disorders of breast: Secondary | ICD-10-CM | POA: Diagnosis not present

## 2014-12-19 DIAGNOSIS — R928 Other abnormal and inconclusive findings on diagnostic imaging of breast: Secondary | ICD-10-CM

## 2014-12-22 DIAGNOSIS — H40023 Open angle with borderline findings, high risk, bilateral: Secondary | ICD-10-CM | POA: Diagnosis not present

## 2014-12-22 DIAGNOSIS — H04123 Dry eye syndrome of bilateral lacrimal glands: Secondary | ICD-10-CM | POA: Diagnosis not present

## 2015-01-06 ENCOUNTER — Other Ambulatory Visit: Payer: Self-pay | Admitting: General Surgery

## 2015-01-06 DIAGNOSIS — N6022 Fibroadenosis of left breast: Secondary | ICD-10-CM

## 2015-01-07 ENCOUNTER — Other Ambulatory Visit: Payer: Self-pay | Admitting: General Surgery

## 2015-01-07 DIAGNOSIS — N6022 Fibroadenosis of left breast: Secondary | ICD-10-CM

## 2015-02-07 DIAGNOSIS — N6022 Fibroadenosis of left breast: Secondary | ICD-10-CM

## 2015-02-07 HISTORY — DX: Fibroadenosis of left breast: N60.22

## 2015-02-20 DIAGNOSIS — H1132 Conjunctival hemorrhage, left eye: Secondary | ICD-10-CM | POA: Diagnosis not present

## 2015-02-20 DIAGNOSIS — H04123 Dry eye syndrome of bilateral lacrimal glands: Secondary | ICD-10-CM | POA: Diagnosis not present

## 2015-03-06 ENCOUNTER — Encounter (HOSPITAL_BASED_OUTPATIENT_CLINIC_OR_DEPARTMENT_OTHER): Payer: Self-pay | Admitting: *Deleted

## 2015-03-10 ENCOUNTER — Ambulatory Visit
Admission: RE | Admit: 2015-03-10 | Discharge: 2015-03-10 | Disposition: A | Discharge status: Home / Self Care | Payer: Federal, State, Local not specified - PPO | Source: Ambulatory Visit | Attending: General Surgery | Admitting: General Surgery

## 2015-03-10 DIAGNOSIS — N6022 Fibroadenosis of left breast: Secondary | ICD-10-CM

## 2015-03-10 DIAGNOSIS — R928 Other abnormal and inconclusive findings on diagnostic imaging of breast: Secondary | ICD-10-CM | POA: Diagnosis not present

## 2015-03-10 HISTORY — PX: BREAST LUMPECTOMY: SHX2

## 2015-03-13 ENCOUNTER — Ambulatory Visit (HOSPITAL_BASED_OUTPATIENT_CLINIC_OR_DEPARTMENT_OTHER)
Admission: RE | Admit: 2015-03-13 | Discharge: 2015-03-13 | Disposition: A | Discharge status: Home / Self Care | Payer: Federal, State, Local not specified - PPO | Source: Ambulatory Visit | Attending: General Surgery | Admitting: General Surgery

## 2015-03-13 ENCOUNTER — Encounter (HOSPITAL_BASED_OUTPATIENT_CLINIC_OR_DEPARTMENT_OTHER)
Admission: RE | Disposition: A | Discharge status: Home / Self Care | Payer: Self-pay | Source: Ambulatory Visit | Attending: General Surgery

## 2015-03-13 ENCOUNTER — Ambulatory Visit (HOSPITAL_BASED_OUTPATIENT_CLINIC_OR_DEPARTMENT_OTHER): Payer: Federal, State, Local not specified - PPO | Admitting: Anesthesiology

## 2015-03-13 ENCOUNTER — Encounter (HOSPITAL_BASED_OUTPATIENT_CLINIC_OR_DEPARTMENT_OTHER): Payer: Self-pay | Admitting: *Deleted

## 2015-03-13 ENCOUNTER — Ambulatory Visit
Admission: RE | Admit: 2015-03-13 | Discharge: 2015-03-13 | Disposition: A | Discharge status: Home / Self Care | Payer: Federal, State, Local not specified - PPO | Source: Ambulatory Visit | Attending: General Surgery | Admitting: General Surgery

## 2015-03-13 DIAGNOSIS — N6082 Other benign mammary dysplasias of left breast: Secondary | ICD-10-CM | POA: Diagnosis not present

## 2015-03-13 DIAGNOSIS — N6022 Fibroadenosis of left breast: Secondary | ICD-10-CM

## 2015-03-13 DIAGNOSIS — R928 Other abnormal and inconclusive findings on diagnostic imaging of breast: Secondary | ICD-10-CM | POA: Diagnosis not present

## 2015-03-13 DIAGNOSIS — R921 Mammographic calcification found on diagnostic imaging of breast: Secondary | ICD-10-CM | POA: Diagnosis not present

## 2015-03-13 DIAGNOSIS — N6489 Other specified disorders of breast: Secondary | ICD-10-CM | POA: Diagnosis not present

## 2015-03-13 HISTORY — DX: Presence of dental prosthetic device (complete) (partial): Z97.2

## 2015-03-13 HISTORY — DX: Fibroadenosis of left breast: N60.22

## 2015-03-13 HISTORY — PX: BREAST LUMPECTOMY WITH RADIOACTIVE SEED LOCALIZATION: SHX6424

## 2015-03-13 HISTORY — DX: Other seasonal allergic rhinitis: J30.2

## 2015-03-13 HISTORY — DX: Unspecified cataract: H26.9

## 2015-03-13 SURGERY — BREAST LUMPECTOMY WITH RADIOACTIVE SEED LOCALIZATION
Anesthesia: General | Site: Breast | Laterality: Left

## 2015-03-13 MED ORDER — CHLORHEXIDINE GLUCONATE 4 % EX LIQD: 1.0000 "application " | Freq: Once | CUTANEOUS | Status: DC

## 2015-03-13 MED ORDER — LACTATED RINGERS IV SOLN
INTRAVENOUS | Status: DC
  Administered 2015-03-13: 10 mL/h via INTRAVENOUS

## 2015-03-13 MED ORDER — LIDOCAINE HCL (CARDIAC) 20 MG/ML IV SOLN
INTRAVENOUS | Status: DC | PRN
  Administered 2015-03-13: 50 mg via INTRAVENOUS

## 2015-03-13 MED ORDER — FENTANYL CITRATE (PF) 100 MCG/2ML IJ SOLN: 50.0000 ug | INTRAMUSCULAR | Status: DC | PRN

## 2015-03-13 MED ORDER — PROPOFOL 10 MG/ML IV BOLUS
INTRAVENOUS | Status: AC
  Filled 2015-03-13: qty 20

## 2015-03-13 MED ORDER — MEPERIDINE HCL 25 MG/ML IJ SOLN
6.2500 mg | INTRAMUSCULAR | Status: DC | PRN
Start: 2015-03-13 — End: 2015-03-13

## 2015-03-13 MED ORDER — GLYCOPYRROLATE 0.2 MG/ML IJ SOLN: 0.2000 mg | Freq: Once | INTRAMUSCULAR | Status: DC | PRN

## 2015-03-13 MED ORDER — MIDAZOLAM HCL 2 MG/2ML IJ SOLN
INTRAMUSCULAR | Status: AC
  Filled 2015-03-13: qty 4

## 2015-03-13 MED ORDER — FENTANYL CITRATE (PF) 100 MCG/2ML IJ SOLN
INTRAMUSCULAR | Status: DC | PRN
  Administered 2015-03-13: 100 ug via INTRAVENOUS

## 2015-03-13 MED ORDER — CEFAZOLIN SODIUM-DEXTROSE 2-3 GM-% IV SOLR
INTRAVENOUS | Status: AC
  Filled 2015-03-13: qty 50

## 2015-03-13 MED ORDER — PROPOFOL 10 MG/ML IV BOLUS
INTRAVENOUS | Status: DC | PRN
  Administered 2015-03-13: 200 mg via INTRAVENOUS

## 2015-03-13 MED ORDER — FENTANYL CITRATE (PF) 100 MCG/2ML IJ SOLN
INTRAMUSCULAR | Status: AC
  Filled 2015-03-13: qty 4

## 2015-03-13 MED ORDER — BUPIVACAINE HCL (PF) 0.25 % IJ SOLN
INTRAMUSCULAR | Status: AC
  Filled 2015-03-13: qty 30

## 2015-03-13 MED ORDER — DEXAMETHASONE SODIUM PHOSPHATE 10 MG/ML IJ SOLN
INTRAMUSCULAR | Status: AC
  Filled 2015-03-13: qty 1

## 2015-03-13 MED ORDER — BUPIVACAINE-EPINEPHRINE (PF) 0.25% -1:200000 IJ SOLN
INTRAMUSCULAR | Status: AC
  Filled 2015-03-13: qty 120

## 2015-03-13 MED ORDER — OXYCODONE HCL 5 MG/5ML PO SOLN: 5.0000 mg | Freq: Once | ORAL | Status: DC | PRN

## 2015-03-13 MED ORDER — OXYCODONE-ACETAMINOPHEN 5-325 MG PO TABS: 1.0000 | ORAL_TABLET | ORAL | Status: DC | PRN

## 2015-03-13 MED ORDER — SCOPOLAMINE 1 MG/3DAYS TD PT72
1.0000 | MEDICATED_PATCH | Freq: Once | TRANSDERMAL | Status: DC | PRN
Start: 2015-03-13 — End: 2015-03-13

## 2015-03-13 MED ORDER — MIDAZOLAM HCL 2 MG/2ML IJ SOLN: 1.0000 mg | INTRAMUSCULAR | Status: DC | PRN

## 2015-03-13 MED ORDER — MIDAZOLAM HCL 5 MG/5ML IJ SOLN
INTRAMUSCULAR | Status: DC | PRN
  Administered 2015-03-13: 2 mg via INTRAVENOUS

## 2015-03-13 MED ORDER — HYDROMORPHONE HCL 1 MG/ML IJ SOLN: 0.2500 mg | INTRAMUSCULAR | Status: DC | PRN

## 2015-03-13 MED ORDER — DEXAMETHASONE SODIUM PHOSPHATE 4 MG/ML IJ SOLN
INTRAMUSCULAR | Status: DC | PRN
  Administered 2015-03-13: 10 mg via INTRAVENOUS

## 2015-03-13 MED ORDER — BUPIVACAINE-EPINEPHRINE (PF) 0.25% -1:200000 IJ SOLN
INTRAMUSCULAR | Status: DC | PRN
  Administered 2015-03-13: 17 mL

## 2015-03-13 MED ORDER — OXYCODONE HCL 5 MG PO TABS: 5.0000 mg | ORAL_TABLET | Freq: Once | ORAL | Status: DC | PRN

## 2015-03-13 MED ORDER — LIDOCAINE HCL (CARDIAC) 20 MG/ML IV SOLN
INTRAVENOUS | Status: AC
  Filled 2015-03-13: qty 5

## 2015-03-13 MED ORDER — ONDANSETRON HCL 4 MG/2ML IJ SOLN
INTRAMUSCULAR | Status: AC
  Filled 2015-03-13: qty 2

## 2015-03-13 MED ORDER — CEFAZOLIN SODIUM-DEXTROSE 2-3 GM-% IV SOLR
2.0000 g | INTRAVENOUS | Status: AC
  Administered 2015-03-13: 2 g via INTRAVENOUS

## 2015-03-13 SURGICAL SUPPLY — 41 items
APPLIER CLIP 9.375 MED OPEN (MISCELLANEOUS)
APR CLP MED 9.3 20 MLT OPN (MISCELLANEOUS)
BLADE SURG 15 STRL LF DISP TIS (BLADE) ×1 IMPLANT
BLADE SURG 15 STRL SS (BLADE) ×3
CANISTER SUC SOCK COL 7IN (MISCELLANEOUS) ×1 IMPLANT
CANISTER SUCT 1200ML W/VALVE (MISCELLANEOUS) ×1 IMPLANT
CHLORAPREP W/TINT 26ML (MISCELLANEOUS) ×3 IMPLANT
CLIP APPLIE 9.375 MED OPEN (MISCELLANEOUS) IMPLANT
COVER BACK TABLE 60X90IN (DRAPES) ×3 IMPLANT
COVER MAYO STAND STRL (DRAPES) ×3 IMPLANT
COVER PROBE W GEL 5X96 (DRAPES) ×3 IMPLANT
DECANTER SPIKE VIAL GLASS SM (MISCELLANEOUS) IMPLANT
DEVICE DUBIN W/COMP PLATE 8390 (MISCELLANEOUS) ×3 IMPLANT
DRAPE LAPAROSCOPIC ABDOMINAL (DRAPES) ×3 IMPLANT
DRAPE UTILITY XL STRL (DRAPES) ×3 IMPLANT
ELECT COATED BLADE 2.86 ST (ELECTRODE) ×3 IMPLANT
ELECT REM PT RETURN 9FT ADLT (ELECTROSURGICAL) ×3
ELECTRODE REM PT RTRN 9FT ADLT (ELECTROSURGICAL) ×1 IMPLANT
GLOVE BIO SURGEON STRL SZ7.5 (GLOVE) ×6 IMPLANT
GLOVE EXAM NITRILE MD LF STRL (GLOVE) ×2 IMPLANT
GLOVE SURG SS PI 7.0 STRL IVOR (GLOVE) ×2 IMPLANT
GOWN STRL REUS W/ TWL LRG LVL3 (GOWN DISPOSABLE) ×2 IMPLANT
GOWN STRL REUS W/TWL LRG LVL3 (GOWN DISPOSABLE) ×6
KIT MARKER MARGIN INK (KITS) ×3 IMPLANT
LIQUID BAND (GAUZE/BANDAGES/DRESSINGS) ×3 IMPLANT
NDL HYPO 25X1 1.5 SAFETY (NEEDLE) IMPLANT
NEEDLE HYPO 25X1 1.5 SAFETY (NEEDLE) ×3 IMPLANT
NS IRRIG 1000ML POUR BTL (IV SOLUTION) IMPLANT
PACK BASIN DAY SURGERY FS (CUSTOM PROCEDURE TRAY) ×3 IMPLANT
PENCIL BUTTON HOLSTER BLD 10FT (ELECTRODE) ×3 IMPLANT
SLEEVE SCD COMPRESS KNEE MED (MISCELLANEOUS) ×3 IMPLANT
SPONGE LAP 18X18 X RAY DECT (DISPOSABLE) ×3 IMPLANT
SUT MON AB 4-0 PC3 18 (SUTURE) ×2 IMPLANT
SUT SILK 2 0 SH (SUTURE) IMPLANT
SUT VICRYL 3-0 CR8 SH (SUTURE) ×3 IMPLANT
SYR CONTROL 10ML LL (SYRINGE) ×2 IMPLANT
TOWEL OR 17X24 6PK STRL BLUE (TOWEL DISPOSABLE) ×3 IMPLANT
TOWEL OR NON WOVEN STRL DISP B (DISPOSABLE) ×1 IMPLANT
TUBE CONNECTING 20'X1/4 (TUBING)
TUBE CONNECTING 20X1/4 (TUBING) ×1 IMPLANT
YANKAUER SUCT BULB TIP NO VENT (SUCTIONS) IMPLANT

## 2015-03-13 NOTE — Anesthesia Postprocedure Evaluation (Signed)
  Anesthesia Post-op Note  Patient: Bethany Frederick [facility]  Procedure(s) Performed: Procedure(s): BREAST LUMPECTOMY WITH RADIOACTIVE SEED LOCALIZATION (Left)  Patient Location: PACU  Anesthesia Type: General   Level of Consciousness: awake, alert  and oriented  Airway and Oxygen Therapy: Patient Spontanous Breathing  Post-op Pain: mild  Post-op Assessment: Post-op Vital signs reviewed  Post-op Vital Signs: Reviewed  Last Vitals:  Filed Vitals:   03/13/15 0900  BP: 142/83  Pulse: 75  Temp:   Resp: 22    Complications: No apparent anesthesia complications

## 2015-03-13 NOTE — Transfer of Care (Signed)
Immediate Anesthesia Transfer of Care Note  Patient: Bethany Frederick [facility]  Procedure(s) Performed: Procedure(s): BREAST LUMPECTOMY WITH RADIOACTIVE SEED LOCALIZATION (Left)  Patient Location: PACU  Anesthesia Type:General  Level of Consciousness: awake and patient cooperative  Airway & Oxygen Therapy: Patient Spontanous Breathing and Patient connected to face mask oxygen  Post-op Assessment: Report given to RN and Post -op Vital signs reviewed and stable  Post vital signs: Reviewed and stable  Last Vitals:  Filed Vitals:   03/13/15 0641  BP: 146/63  Pulse: 73  Temp: 36.7 C  Resp: 18    Complications: No apparent anesthesia complications

## 2015-03-13 NOTE — Anesthesia Procedure Notes (Signed)
Procedure Name: LMA Insertion Date/Time: 03/13/2015 7:36 AM Performed by: Toula Moos L Pre-anesthesia Checklist: Patient identified, Emergency Drugs available, Suction available and Patient being monitored Patient Re-evaluated:Patient Re-evaluated prior to inductionOxygen Delivery Method: Circle System Utilized Preoxygenation: Pre-oxygenation with 100% oxygen Intubation Type: IV induction Ventilation: Mask ventilation without difficulty LMA: LMA inserted LMA Size: 4.0 Number of attempts: 1 Airway Equipment and Method: Bite block Placement Confirmation: positive ETCO2 Tube secured with: Tape Dental Injury: Teeth and Oropharynx as per pre-operative assessment

## 2015-03-13 NOTE — Interval H&P Note (Signed)
History and Physical Interval Note:  03/13/2015 5:44 AM  Bethany Frederick  has presented today for surgery, with the diagnosis of LEFT BREAST SCLEROSING LESION  The various methods of treatment have been discussed with the patient and family. After consideration of risks, benefits and other options for treatment, the patient has consented to  Procedure(s): BREAST LUMPECTOMY WITH RADIOACTIVE SEED LOCALIZATION (Left) as a surgical intervention .  The patient's history has been reviewed, patient examined, no change in status, stable for surgery.  I have reviewed the patient's chart and labs.  Questions were answered to the patient's satisfaction.     TOTH III,Parsa Rickett S

## 2015-03-13 NOTE — Op Note (Signed)
03/13/2015  8:05 AM  PATIENT:  Bethany Frederick  64 y.o. female  PRE-OPERATIVE DIAGNOSIS:  LEFT BREAST SCLEROSING LESION  POST-OPERATIVE DIAGNOSIS:  LEFT BREAST SCLEROSING LESION  PROCEDURE:  Procedure(s): BREAST LUMPECTOMY WITH RADIOACTIVE SEED LOCALIZATION (Left)  SURGEON:  Surgeon(s) and Role:    * Jovita Kussmaul, MD - Primary  PHYSICIAN ASSISTANT:   ASSISTANTS: none   ANESTHESIA:   general  EBL:     BLOOD ADMINISTERED:none  DRAINS: none   LOCAL MEDICATIONS USED:  MARCAINE     SPECIMEN:  Source of Specimen:  left breast tissue  DISPOSITION OF SPECIMEN:  PATHOLOGY  COUNTS:  YES  TOURNIQUET:  * No tourniquets in log *  DICTATION: .Dragon Dictation   After informed consent was obtained the patient was brought to the operating room and placed in the supine position on the operating room table. After adequate induction of general anesthesia the patient's left breast was prepped with ChloraPrep, allowed to dry, and draped in usual sterile manner. Previously an I-125 seed was placed in the upper inner quadrant of the left breast to mark an area of a complex sclerosing lesion. The neoprobe was set to I-125. The area of radioactivity in the upper inner left breast was readily identified. A curvilinear incision was made with a 15 blade knife overlying the radioactivity. The incision was carried through the skin and subcutaneous drainage tissue sharply with electrocautery until the breast tissue was entered. While checking the area of radioactivity frequently a circular portion of breast tissue was excised sharply around the radioactive seed. Once the specimen was removed it was oriented with the appropriate paint colors. A specimen radiograph showed the clip and seed to be in the center of the specimen. There was no residual I-125 radioactivity in the left breast. The specimen was then sent to pathology for further evaluation. Hemostasis was achieved using the Bovie electrocautery. The  wound was irrigated with saline and infiltrated with quarter percent Marcaine. The deep layer of the wound was closed with interrupted 3-0 Vicryl stitches. The skin was then closed with interrupted 4-0 Monocryl subcuticular stitches. Dermabond dressings were applied. Patient tolerated the procedure well. At the end of the case all needle sponge and instrument counts were correct. The patient was then awakened and taken to recovery in stable condition.  PLAN OF CARE: Discharge to home after PACU  PATIENT DISPOSITION:  PACU - hemodynamically stable.   Delay start of Pharmacological VTE agent (>24hrs) due to surgical blood loss or risk of bleeding: not applicable

## 2015-03-13 NOTE — Anesthesia Preprocedure Evaluation (Signed)
Anesthesia Evaluation  Patient identified by MRN, date of birth, ID band Patient awake    Reviewed: Allergy & Precautions, NPO status , Patient's Chart, lab work & pertinent test results  Airway Mallampati: I  TM Distance: >3 FB Neck ROM: Full    Dental  (+) Teeth Intact, Dental Advisory Given   Pulmonary  breath sounds clear to auscultation        Cardiovascular Rhythm:Regular Rate:Normal     Neuro/Psych    GI/Hepatic   Endo/Other    Renal/GU      Musculoskeletal   Abdominal   Peds  Hematology   Anesthesia Other Findings   Reproductive/Obstetrics                             Anesthesia Physical Anesthesia Plan  ASA: II  Anesthesia Plan: General   Post-op Pain Management:    Induction: Intravenous  Airway Management Planned: LMA  Additional Equipment:   Intra-op Plan:   Post-operative Plan: Extubation in OR  Informed Consent: I have reviewed the patients History and Physical, chart, labs and discussed the procedure including the risks, benefits and alternatives for the proposed anesthesia with the patient or authorized representative who has indicated his/her understanding and acceptance.   Dental advisory given  Plan Discussed with: CRNA, Anesthesiologist and Surgeon  Anesthesia Plan Comments:         Anesthesia Quick Evaluation  

## 2015-03-13 NOTE — H&P (Signed)
Bethany Frederick 01/06/2015 11:19 AM Location: [facility] Patient #: [id number] DOB: 01-May-1951 Married / Language: English / Race: White Female   History of Present Illness Sammuel Hines. Marlou Starks MD; 01/06/2015 12:01 PM) Patient words: [place] breast f/u.  The patient is a 64 year old female who presents with a breast mass. We are asked to see the patient in consultation by Dr. Ammie Ferrier to evaluate her for a left breast mass. The patient is a 64 year old white female who recently went for a routine screening mammogram. At that time she was found to have an area of distortion in the upper inner left breast. This was biopsied and came back as a complex sclerosing lesion. She denies any breast pain or discharge from the nipple. She does not take any hormone replacement. Her only breast cancer history in her family is in a paternal aunt.   Other Problems Marjean Donna, CMA; 01/06/2015 11:19 AM) Arthritis Back Pain Gastroesophageal Reflux Disease Inguinal Hernia  Past Surgical History Marjean Donna, CMA; 01/06/2015 11:19 AM) Appendectomy Breast Biopsy Left. Open Inguinal Hernia Surgery Left. Shoulder Surgery Left.  Diagnostic Studies History Marjean Donna, CMA; 01/06/2015 11:19 AM) Colonoscopy never Mammogram within last year Pap Smear 1-5 years ago  Allergies Marjean Donna, CMA; 01/06/2015 11:20 AM) No Known Drug Allergies08/30/2016  Medication History Marjean Donna, CMA; 01/06/2015 11:20 AM) Advil (100MG  Tablet Chewable, Oral) Active. Tylenol (325MG  Tablet, Oral) Active. Medications Reconciled  Pregnancy / Birth History Marjean Donna, CMA; 01/06/2015 11:19 AM) Age at menarche 10 years. Age of menopause 51-55 Contraceptive History Oral contraceptives. Gravida 1 Maternal age 37-40 Para 1    Review of Systems ([place]; 01/06/2015 11:19 AM) General Not Present- Appetite Loss, Chills, Fatigue, Fever, Night Sweats, Weight Gain and Weight  Loss. Skin Not Present- Change in Wart/Mole, Dryness, Hives, Jaundice, New Lesions, Non-Healing Wounds, Rash and Ulcer. HEENT Present- Wears glasses/contact lenses. Not Present- Earache, Hearing Loss, Hoarseness, Nose Bleed, Oral Ulcers, Ringing in the Ears, Seasonal Allergies, Sinus Pain, Sore Throat, Visual Disturbances and Yellow Eyes. Respiratory Not Present- Bloody sputum, Chronic Cough, Difficulty Breathing, Snoring and Wheezing. Breast Not Present- Breast Mass, Breast Pain, Nipple Discharge and Skin Changes. Cardiovascular Present- Swelling of Extremities. Not Present- Chest Pain, Difficulty Breathing Lying Down, Leg Cramps, Palpitations, Rapid Heart Rate and Shortness of Breath. Gastrointestinal Not Present- Abdominal Pain, Bloating, Bloody Stool, Change in Bowel Habits, Chronic diarrhea, Constipation, Difficulty Swallowing, Excessive gas, Gets full quickly at meals, Hemorrhoids, Indigestion, Nausea, Rectal Pain and Vomiting. Female Genitourinary Not Present- Frequency, Nocturia, Painful Urination, Pelvic Pain and Urgency. Musculoskeletal Present- Joint Pain. Not Present- Back Pain, Joint Stiffness, Muscle Pain, Muscle Weakness and Swelling of Extremities. Neurological Not Present- Decreased Memory, Fainting, Headaches, Numbness, Seizures, Tingling, Tremor, Trouble walking and Weakness. Psychiatric Not Present- Anxiety, Bipolar, Change in Sleep Pattern, Depression, Fearful and Frequent crying. Endocrine Not Present- Cold Intolerance, Excessive Hunger, Hair Changes, Heat Intolerance, Hot flashes and New Diabetes. Hematology Not Present- Easy Bruising, Excessive bleeding, Gland problems, HIV and Persistent Infections.  Vitals (Sonya Bynum CMA; 01/06/2015 11:20 AM) 01/06/2015 11:19 AM Weight: 191.6 lb Height: 66in Body Surface Area: 2.01 m Body Mass Index: 30.92 kg/m  Temp.: 97.11F(Temporal)  Pulse: 65 (Regular)  BP: 130/76 (Sitting, Left Arm, Standard)     Physical Exam  Eddie Dibbles S. Marlou Starks MD; 01/06/2015 12:02 PM) General Mental Status-Alert. General Appearance-Consistent with stated age. Hydration-Well hydrated. Voice-Normal.  Head and Neck Head-normocephalic, atraumatic with no lesions or palpable masses. Trachea-midline. Thyroid Gland Characteristics - normal  size and consistency.  Eye Eyeball - Bilateral-Extraocular movements intact. Sclera/Conjunctiva - Bilateral-No scleral icterus.  Chest and Lung Exam Chest and lung exam reveals -quiet, even and easy respiratory effort with no use of accessory muscles and on auscultation, normal breath sounds, no adventitious sounds and normal vocal resonance. Inspection Chest Wall - Normal. Back - normal.  Breast Note: There is no palpable mass in either breast. There is a small palpable mobile lymph node in both the left and right axillas. There is no palpable supraclavicular or cervical lymphadenopathy.   Cardiovascular Cardiovascular examination reveals -normal heart sounds, regular rate and rhythm with no murmurs and normal pedal pulses bilaterally.  Abdomen Inspection Inspection of the abdomen reveals - No Hernias. Skin - Scar - no surgical scars. Palpation/Percussion Palpation and Percussion of the abdomen reveal - Soft, Non Tender, No Rebound tenderness, No Rigidity (guarding) and No hepatosplenomegaly. Auscultation Auscultation of the abdomen reveals - Bowel sounds normal.  Neurologic Neurologic evaluation reveals -alert and oriented x 3 with no impairment of recent or remote memory. Mental Status-Normal.  Musculoskeletal Normal Exam - Left-Upper Extremity Strength Normal and Lower Extremity Strength Normal. Normal Exam - Right-Upper Extremity Strength Normal and Lower Extremity Strength Normal.  Lymphatic Head & Neck  General Head & Neck Lymphatics: Bilateral - Description - Normal. Axillary  General Axillary Region: Bilateral - Description - Normal.  Tenderness - Non Tender. Femoral & Inguinal  Generalized Femoral & Inguinal Lymphatics: Bilateral - Description - Normal. Tenderness - Non Tender.    Assessment & Plan Eddie Dibbles S. Marlou Starks MD; 01/06/2015 12:00 PM) SCLEROSING ADENOSIS OF BREAST, LEFT (610.2  N60.22) Impression: The patient has an area of complex sclerosing lesion in the upper inner left breast. Because this is considered a high risk lesion and because of its abnormal appearance I would recommend having this area removed. I have discussed with her in detail the risks and benefits of the operation to remove this area as well as some of the technical aspects and she understands and wishes to proceed. I will plan for a left breast radioactive seed localized lumpectomy Current Plans Pt Education - Breast Diseases: discussed with patient and provided information.   Signed by Luella Cook, MD (01/06/2015 12:03 PM)

## 2015-03-13 NOTE — Discharge Instructions (Signed)

## 2015-03-16 ENCOUNTER — Encounter (HOSPITAL_BASED_OUTPATIENT_CLINIC_OR_DEPARTMENT_OTHER): Payer: Self-pay | Admitting: General Surgery

## 2015-03-30 ENCOUNTER — Telehealth: Payer: Self-pay | Admitting: Hematology

## 2015-03-30 NOTE — Telephone Encounter (Signed)
new high risk-s/w patient and gave appt for 05/14/15  @ 2:15 w/Dr. Burr Medico. Patient requested appt after Christmas.  Referring Dr. Autumn Messing  Referral information scanned

## 2015-05-08 ENCOUNTER — Telehealth: Payer: Self-pay | Admitting: Hematology

## 2015-05-08 NOTE — Telephone Encounter (Signed)
patient called to confirm high risk appt 01/05 @ 2:30 w/Dr. Burr Medico

## 2015-05-10 DIAGNOSIS — C187 Malignant neoplasm of sigmoid colon: Secondary | ICD-10-CM

## 2015-05-10 HISTORY — DX: Malignant neoplasm of sigmoid colon: C18.7

## 2015-05-14 ENCOUNTER — Ambulatory Visit (HOSPITAL_BASED_OUTPATIENT_CLINIC_OR_DEPARTMENT_OTHER): Payer: Federal, State, Local not specified - PPO | Admitting: Hematology

## 2015-05-14 ENCOUNTER — Encounter: Payer: Self-pay | Admitting: Hematology

## 2015-05-14 VITALS — BP 127/72 | HR 56 | Temp 98.1°F | Resp 18 | Ht 67.0 in | Wt 194.5 lb

## 2015-05-14 DIAGNOSIS — N6092 Unspecified benign mammary dysplasia of left breast: Secondary | ICD-10-CM

## 2015-05-14 DIAGNOSIS — N6022 Fibroadenosis of left breast: Secondary | ICD-10-CM

## 2015-05-14 NOTE — Progress Notes (Signed)
[facility]  [phone] [phone]  Clinic New Consult Note   Patient Care Team: Leonides Sake, MD as PCP - General (Family Medicine) 05/14/2015  REFERRAL PHYSICIAN: Dr. 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  HISTORY OF PRESENTING ILLNESS:  Bethany Frederick 65 y.o. female is here to discussed her risk of breast cancer.   She has had palpable left breast mass for over 20 years, unchanged. Her mammogram in 2014 showed a 0.8 cm cyst. She does screening mammogram once a year. The lesion looks slightly more suspicious on the mammogram in August 2016, and a biopsy was recommended, which showed sclerosing lesion with typical ductal hyperplasia. She was referred to see Dr. Alvira Monday and underwent left breast lumpectomy. She tolerated the surgery very well, and has recovered completely.  She feels well, has mild fatigue, she takes care two of her brothers, who live alone and have health issues. She lives with her hsuband and 8 yo son. She remains physically active, does not exercise regularly. She has mild arthritis, takes ibuprofen and Aleve as needed, no other complaints.  GYN HISTORY  Menarchal: 10 LMP: 6 Contraceptive: 24 years  HRT: 5 years, stopped at age of 42  G1P1: one son, she was 61 yo when she had her son    MEDICAL HISTORY:  Past Medical History  Diagnosis Date  . Arthritis     back, hip, feet  . 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 Cataract, immature   . Wears partial dentures     upper front - 1 tooth  . Seasonal allergies     SURGICAL HISTORY: Past Surgical History  Procedure Laterality Date  . Inguinal hernia repair  03/05/2012    Procedure: HERNIA REPAIR INGUINAL INCARCERATED;  Surgeon: Ralene Ok, MD;  Location: [facility];  Service: General;  Laterality: N/A;  . Insertion of mesh  03/05/2012    Procedure: INSERTION OF MESH;  Surgeon:  Ralene Ok, MD;  Location: [facility];  Service: General;  Laterality: Left;  . Appendectomy    . Myomectomy  06/13/2000  . Endometrial fulguration  06/13/2000  . Breast lumpectomy with radioactive seed localization Left 03/13/2015    Procedure: BREAST LUMPECTOMY WITH RADIOACTIVE SEED LOCALIZATION;  Surgeon: Autumn Messing III, MD;  Location: [facility];  Service: General;  Laterality: Left;    SOCIAL HISTORY: Social History   Social History  . Marital Status: Married    Spouse Name: N/A  . Number of Children: N/A  . Years of Education: N/A   Occupational History  . Not on file.   Social History Main Topics  . Smoking status: Never Smoker   . Smokeless tobacco: Never Used  . Alcohol Use: Yes     Comment: occasionally  . Drug Use: No  . Sexual Activity: Not Currently   Other Topics Concern  . Not on file   Social History Narrative    FAMILY HISTORY: No family history on file.  ALLERGIES:  has No Known Allergies.  MEDICATIONS:  Current Outpatient Prescriptions  Medication Sig Dispense Refill  . FLUVIRIN 0.5 ML SUSY ADM 0.5ML IM UTD  0   No current facility-administered medications for this visit.    REVIEW OF SYSTEMS:   Constitutional: Denies fevers, chills or abnormal night sweats Eyes: Denies blurriness of vision, double vision or watery eyes Ears, nose, mouth, throat, and face: Denies mucositis or sore throat Respiratory: Denies cough, dyspnea  or wheezes Cardiovascular: Denies palpitation, chest discomfort or lower extremity swelling Gastrointestinal:  Denies nausea, heartburn or change in bowel habits Skin: Denies abnormal skin rashes Lymphatics: Denies new lymphadenopathy or easy bruising Neurological:Denies numbness, tingling or new weaknesses Behavioral/Psych: Mood is stable, no new changes  All other systems were reviewed with the patient and are negative.  PHYSICAL EXAMINATION: ECOG PERFORMANCE STATUS: 1 - Symptomatic but completely  ambulatory  Filed Vitals:   05/14/15 1501 05/14/15 1502  BP: 127/72 127/72  Pulse: 56 56  Temp: 98.1 F (36.7 C) 98.1 F (36.7 C)  Resp: 18 18   Filed Weights   05/14/15 1501 05/14/15 1502  Weight: 194 lb 8 oz (88.225 kg) 194 lb 8 oz (88.225 kg)    GENERAL:alert, no distress and comfortable SKIN: skin color, texture, turgor are normal, no rashes or significant lesions EYES: normal, conjunctiva are pink and non-injected, sclera clear OROPHARYNX:no exudate, no erythema and lips, buccal mucosa, and tongue normal  NECK: supple, thyroid normal size, non-tender, without nodularity LYMPH:  no palpable lymphadenopathy in the cervical, axillary or inguinal LUNGS: clear to auscultation and percussion with normal breathing effort HEART: regular rate & rhythm and no murmurs and no lower extremity edema ABDOMEN:abdomen soft, non-tender and normal bowel sounds Musculoskeletal:no cyanosis of digits and no clubbing  PSYCH: alert & oriented x 3 with fluent speech NEURO: no focal motor/sensory deficits  LABORATORY DATA:  I have reviewed the data as listed Lab Results  Component Value Date   WBC 7.5 03/06/2012   HGB 12.0 03/06/2012   HCT 36.6 03/06/2012   MCV 88.2 03/06/2012   PLT 147* 03/06/2012   No results for input(s): NA, K, CL, CO2, GLUCOSE, BUN, CREATININE, CALCIUM, GFRNONAA, GFRAA, PROT, ALBUMIN, AST, ALT, ALKPHOS, BILITOT, BILIDIR, IBILI in the last 8760 hours.  PATHOLOGY REPORT  Diagnosis 03/13/2015  Breast, lumpectomy, left - COMPLEX SCLEROSING LESION SHOWING FIBROCYSTIC CHANGES WITH USUAL DUCTAL HYPERPLASIA AND ASSOCIATED CALCIFICATIONS. - NO ATYPIA OR MALIGNANCY IDENTIFIED. - SEE COMMENT. Microscopic Comment Dr. Lyndon Code has seen slides 1A-1D in consultation with agreement, as they pertain to the above diagnosis. The specimen was previously inked and is histologically examined. (RH:ds 03/16/15)   RADIOGRAPHIC STUDIES: I have personally reviewed the radiological images as  listed and agreed with the findings in the report. No results found.  ASSESSMENT & PLAN:  65 year old Caucasian female, with recently found to have a left breast sclerosing lesion with usual ductal hyperplasia.  1. 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 This is considered a benign breast lesion, not high risk for breast cancer. -I used the [employer] Janus model to calculate her risk of developing breast cancer, Based on her age, family history, pregnancy history, etc, her risk of developing breast cancer is 3.3% in 5 years and 11.7% in her lifetime. This is consider moderate risk. -We reviewed the risk factors for developing breast cancer, and strategies to prevent breast cancer, especially healthy diet, exercise, and maintain ideal weight.  -According to the at FDA indications, based on her risk of breast cancer estimated from [facility], she is qualified for chemoprevention with tamoxifen or anastrozole. Potential benefit and risks (side effects) were discussed with her in great details. I given her the written material of tamoxifen and anastrozole for her to review. Given her overall moderate risk, I do not strongly feel she needs to take chemoprevention. She agrees with it. -We finally discussed the breast cancer screening, including  yearly screening mammogram with 3-D technology, self-exam every months and physician exam breast exam once a year.   Follow up: I'll see her as needed in the future. I given her my contact information and she will call me if needed in the future.   All questions were answered. The patient knows to call the clinic with any problems, questions or concerns. I spent 40 minutes counseling the patient face to face. The total time spent in the appointment was 50 minutes and more than 50% was on counseling.     Truitt Merle, MD 05/14/2015 3:25 PM

## 2015-06-26 DIAGNOSIS — H524 Presbyopia: Secondary | ICD-10-CM | POA: Diagnosis not present

## 2015-06-26 DIAGNOSIS — H40023 Open angle with borderline findings, high risk, bilateral: Secondary | ICD-10-CM | POA: Diagnosis not present

## 2015-06-26 DIAGNOSIS — H5203 Hypermetropia, bilateral: Secondary | ICD-10-CM | POA: Diagnosis not present

## 2015-06-26 DIAGNOSIS — H43812 Vitreous degeneration, left eye: Secondary | ICD-10-CM | POA: Diagnosis not present

## 2015-06-26 DIAGNOSIS — H52223 Regular astigmatism, bilateral: Secondary | ICD-10-CM | POA: Diagnosis not present

## 2015-06-26 DIAGNOSIS — H353131 Nonexudative age-related macular degeneration, bilateral, early dry stage: Secondary | ICD-10-CM | POA: Diagnosis not present

## 2015-06-26 DIAGNOSIS — H04123 Dry eye syndrome of bilateral lacrimal glands: Secondary | ICD-10-CM | POA: Diagnosis not present

## 2015-09-17 DIAGNOSIS — R609 Edema, unspecified: Secondary | ICD-10-CM | POA: Diagnosis not present

## 2015-09-21 DIAGNOSIS — Z049 Encounter for examination and observation for unspecified reason: Secondary | ICD-10-CM | POA: Diagnosis not present

## 2015-09-21 DIAGNOSIS — R609 Edema, unspecified: Secondary | ICD-10-CM | POA: Diagnosis not present

## 2015-09-24 DIAGNOSIS — N6022 Fibroadenosis of left breast: Secondary | ICD-10-CM | POA: Diagnosis not present

## 2015-10-09 DIAGNOSIS — R945 Abnormal results of liver function studies: Secondary | ICD-10-CM | POA: Diagnosis not present

## 2015-10-09 DIAGNOSIS — Z0000 Encounter for general adult medical examination without abnormal findings: Secondary | ICD-10-CM | POA: Diagnosis not present

## 2015-10-12 ENCOUNTER — Other Ambulatory Visit: Payer: Self-pay | Admitting: Family Medicine

## 2015-10-12 DIAGNOSIS — R7989 Other specified abnormal findings of blood chemistry: Secondary | ICD-10-CM

## 2015-10-12 DIAGNOSIS — R945 Abnormal results of liver function studies: Principal | ICD-10-CM

## 2015-10-22 ENCOUNTER — Other Ambulatory Visit: Payer: Self-pay | Admitting: Family Medicine

## 2015-10-22 ENCOUNTER — Ambulatory Visit
Admission: RE | Admit: 2015-10-22 | Discharge: 2015-10-22 | Disposition: A | Discharge status: Home / Self Care | Payer: Federal, State, Local not specified - PPO | Source: Ambulatory Visit | Attending: Family Medicine | Admitting: Family Medicine

## 2015-10-22 DIAGNOSIS — R7989 Other specified abnormal findings of blood chemistry: Secondary | ICD-10-CM

## 2015-10-22 DIAGNOSIS — R945 Abnormal results of liver function studies: Principal | ICD-10-CM

## 2015-10-22 DIAGNOSIS — K7689 Other specified diseases of liver: Secondary | ICD-10-CM | POA: Diagnosis not present

## 2015-10-28 ENCOUNTER — Other Ambulatory Visit: Payer: Self-pay | Admitting: Family Medicine

## 2015-10-28 DIAGNOSIS — R932 Abnormal findings on diagnostic imaging of liver and biliary tract: Secondary | ICD-10-CM

## 2015-10-28 DIAGNOSIS — R160 Hepatomegaly, not elsewhere classified: Secondary | ICD-10-CM

## 2015-11-06 ENCOUNTER — Ambulatory Visit
Admission: RE | Admit: 2015-11-06 | Discharge: 2015-11-06 | Disposition: A | Discharge status: Home / Self Care | Payer: Federal, State, Local not specified - PPO | Source: Ambulatory Visit | Attending: Family Medicine | Admitting: Family Medicine

## 2015-11-06 DIAGNOSIS — K7689 Other specified diseases of liver: Secondary | ICD-10-CM | POA: Diagnosis not present

## 2015-11-06 DIAGNOSIS — R932 Abnormal findings on diagnostic imaging of liver and biliary tract: Secondary | ICD-10-CM

## 2015-11-06 DIAGNOSIS — R160 Hepatomegaly, not elsewhere classified: Secondary | ICD-10-CM

## 2015-11-06 MED ORDER — GADOBENATE DIMEGLUMINE 529 MG/ML IV SOLN
18.0000 mL | Freq: Once | INTRAVENOUS | Status: AC | PRN
  Administered 2015-11-06: 18 mL via INTRAVENOUS

## 2015-11-30 DIAGNOSIS — Z01818 Encounter for other preprocedural examination: Secondary | ICD-10-CM | POA: Diagnosis not present

## 2015-11-30 DIAGNOSIS — I498 Other specified cardiac arrhythmias: Secondary | ICD-10-CM | POA: Diagnosis not present

## 2015-11-30 DIAGNOSIS — D1803 Hemangioma of intra-abdominal structures: Secondary | ICD-10-CM | POA: Diagnosis not present

## 2015-11-30 DIAGNOSIS — Z1211 Encounter for screening for malignant neoplasm of colon: Secondary | ICD-10-CM | POA: Diagnosis not present

## 2015-11-30 DIAGNOSIS — L290 Pruritus ani: Secondary | ICD-10-CM | POA: Diagnosis not present

## 2015-12-25 DIAGNOSIS — H40023 Open angle with borderline findings, high risk, bilateral: Secondary | ICD-10-CM | POA: Diagnosis not present

## 2015-12-25 DIAGNOSIS — H04123 Dry eye syndrome of bilateral lacrimal glands: Secondary | ICD-10-CM | POA: Diagnosis not present

## 2015-12-30 DIAGNOSIS — Z6831 Body mass index (BMI) 31.0-31.9, adult: Secondary | ICD-10-CM | POA: Diagnosis not present

## 2015-12-30 DIAGNOSIS — Z01419 Encounter for gynecological examination (general) (routine) without abnormal findings: Secondary | ICD-10-CM | POA: Diagnosis not present

## 2015-12-30 DIAGNOSIS — Z1231 Encounter for screening mammogram for malignant neoplasm of breast: Secondary | ICD-10-CM | POA: Diagnosis not present

## 2016-01-04 ENCOUNTER — Other Ambulatory Visit: Payer: Self-pay | Admitting: Gastroenterology

## 2016-01-04 DIAGNOSIS — K6389 Other specified diseases of intestine: Secondary | ICD-10-CM

## 2016-01-05 ENCOUNTER — Ambulatory Visit
Admission: RE | Admit: 2016-01-05 | Discharge: 2016-01-05 | Disposition: A | Discharge status: Home / Self Care | Payer: Federal, State, Local not specified - PPO | Source: Ambulatory Visit | Attending: Gastroenterology | Admitting: Gastroenterology

## 2016-01-05 DIAGNOSIS — K6389 Other specified diseases of intestine: Secondary | ICD-10-CM

## 2016-01-05 MED ORDER — IOPAMIDOL (ISOVUE-300) INJECTION 61%
100.0000 mL | Freq: Once | INTRAVENOUS | Status: AC | PRN
  Administered 2016-01-05: 100 mL via INTRAVENOUS

## 2016-01-07 DIAGNOSIS — C188 Malignant neoplasm of overlapping sites of colon: Secondary | ICD-10-CM | POA: Diagnosis not present

## 2016-01-07 DIAGNOSIS — R740 Nonspecific elevation of levels of transaminase and lactic acid dehydrogenase [LDH]: Secondary | ICD-10-CM | POA: Diagnosis not present

## 2016-01-14 DIAGNOSIS — C187 Malignant neoplasm of sigmoid colon: Secondary | ICD-10-CM | POA: Diagnosis not present

## 2016-01-15 ENCOUNTER — Other Ambulatory Visit: Payer: Self-pay | Admitting: General Surgery

## 2016-01-15 ENCOUNTER — Ambulatory Visit: Payer: Self-pay | Admitting: General Surgery

## 2016-01-22 ENCOUNTER — Encounter (HOSPITAL_COMMUNITY): Payer: Self-pay

## 2016-01-22 NOTE — Pre-Procedure Instructions (Signed)
JENEEN COLAO  01/22/2016     Your procedure is scheduled on : Thursday January 28, 2016 at 9:30 AM.  Report to [facility] Admitting at 7:30 AM.  Call this number if you have problems the morning of surgery: [phone]    Remember:  Do not eat food or drink liquids after midnight. (Please follow your bowel prep instructions)   Take these medicines the morning of surgery with A SIP OF WATER : Famotidine (Pepcid)   Stop taking any vitamins, herbal medications/supplements, NSAIDs, Ibuprofen, Advil, Motrin, Aleve, etc today   Do not wear jewelry, make-up or nail polish.  Do not wear lotions, powders, or perfumes, or deoderant.  Do not shave 48 hours prior to surgery.    Do not bring valuables to the hospital.  [facility] is not responsible for any belongings or valuables.  Contacts, dentures or bridgework may not be worn into surgery.  Leave your suitcase in the car.  After surgery it may be brought to your room.  For patients admitted to the hospital, discharge time will be determined by your treatment team.  Patients discharged the day of surgery will not be allowed to drive home.   Name and phone number of your driver:    Special instructions:  Shower using CHG soap the night before and the morning of your surgery  Please read over the following fact sheets that you were given. Pain Booklet

## 2016-01-25 ENCOUNTER — Encounter (HOSPITAL_COMMUNITY)
Admission: RE | Admit: 2016-01-25 | Discharge: 2016-01-25 | Disposition: A | Discharge status: Home / Self Care | Payer: Federal, State, Local not specified - PPO | Source: Ambulatory Visit | Attending: General Surgery | Admitting: General Surgery

## 2016-01-25 ENCOUNTER — Encounter (HOSPITAL_COMMUNITY): Payer: Self-pay

## 2016-01-25 HISTORY — DX: Palpitations: R00.2

## 2016-01-25 HISTORY — DX: Gastro-esophageal reflux disease without esophagitis: K21.9

## 2016-01-25 LAB — TYPE AND SCREEN
ABO/RH(D): O POS
ANTIBODY SCREEN: NEGATIVE

## 2016-01-25 LAB — BASIC METABOLIC PANEL
Anion gap: 8 (ref 5–15)
BUN: 14 mg/dL (ref 6–20)
CALCIUM: 9.7 mg/dL (ref 8.9–10.3)
CO2: 24 mmol/L (ref 22–32)
CREATININE: 0.6 mg/dL (ref 0.44–1.00)
Chloride: 108 mmol/L (ref 101–111)
GFR calc non Af Amer: >60 mL/min (ref >60)
Glucose, Bld: 100 mg/dL — ABNORMAL HIGH (ref 65–99)
Potassium: 4.5 mmol/L (ref 3.5–5.1)
Sodium: 140 mmol/L (ref 135–145)

## 2016-01-25 LAB — CBC
HEMATOCRIT: 42.7 % (ref 36.0–46.0)
Hemoglobin: 13.8 g/dL (ref 12.0–15.0)
MCH: 28.7 pg (ref 26.0–34.0)
MCHC: 32.3 g/dL (ref 30.0–36.0)
MCV: 88.8 fL (ref 78.0–100.0)
Platelets: 211 10*3/uL (ref 150–400)
RBC: 4.81 MIL/uL (ref 3.87–5.11)
RDW: 14.3 % (ref 11.5–15.5)
WBC: 8.7 10*3/uL (ref 4.0–10.5)

## 2016-01-25 LAB — ABO/RH: ABO/RH(D): O POS

## 2016-01-27 MED ORDER — METRONIDAZOLE IN NACL 5-0.79 MG/ML-% IV SOLN
500.0000 mg | INTRAVENOUS | Status: AC
  Administered 2016-01-28: 500 mg via INTRAVENOUS
  Filled 2016-01-27: qty 100

## 2016-01-27 MED ORDER — CEFTRIAXONE SODIUM 2 G IJ SOLR
2.0000 g | INTRAMUSCULAR | Status: AC
  Administered 2016-01-28: 2 g via INTRAVENOUS
  Filled 2016-01-27: qty 2

## 2016-01-28 ENCOUNTER — Inpatient Hospital Stay (HOSPITAL_COMMUNITY): Payer: Federal, State, Local not specified - PPO | Admitting: Anesthesiology

## 2016-01-28 ENCOUNTER — Inpatient Hospital Stay (HOSPITAL_COMMUNITY): Payer: Federal, State, Local not specified - PPO

## 2016-01-28 ENCOUNTER — Inpatient Hospital Stay (HOSPITAL_COMMUNITY)
Admission: RE | Admit: 2016-01-28 | Discharge: 2016-02-02 | DRG: 331 | Disposition: A | Discharge status: Home / Self Care | Payer: Federal, State, Local not specified - PPO | Source: Ambulatory Visit | Attending: General Surgery | Admitting: General Surgery

## 2016-01-28 ENCOUNTER — Encounter (HOSPITAL_COMMUNITY): Payer: Self-pay | Admitting: Urology

## 2016-01-28 ENCOUNTER — Encounter (HOSPITAL_COMMUNITY)
Admission: RE | Disposition: A | Discharge status: Home / Self Care | Payer: Self-pay | Source: Ambulatory Visit | Attending: General Surgery

## 2016-01-28 DIAGNOSIS — Z419 Encounter for procedure for purposes other than remedying health state, unspecified: Secondary | ICD-10-CM

## 2016-01-28 DIAGNOSIS — Z5331 Laparoscopic surgical procedure converted to open procedure: Secondary | ICD-10-CM | POA: Diagnosis not present

## 2016-01-28 DIAGNOSIS — K219 Gastro-esophageal reflux disease without esophagitis: Secondary | ICD-10-CM | POA: Diagnosis not present

## 2016-01-28 DIAGNOSIS — C187 Malignant neoplasm of sigmoid colon: Secondary | ICD-10-CM | POA: Diagnosis not present

## 2016-01-28 DIAGNOSIS — C19 Malignant neoplasm of rectosigmoid junction: Secondary | ICD-10-CM | POA: Diagnosis present

## 2016-01-28 HISTORY — PX: LAPAROSCOPIC SIGMOID COLECTOMY: SHX5928

## 2016-01-28 SURGERY — COLECTOMY, SIGMOID, LAPAROSCOPIC
Anesthesia: General | Site: Abdomen | Laterality: Bilateral

## 2016-01-28 MED ORDER — OXYCODONE HCL 5 MG/5ML PO SOLN: 5.0000 mg | Freq: Once | ORAL | Status: DC | PRN

## 2016-01-28 MED ORDER — ROCURONIUM BROMIDE 100 MG/10ML IV SOLN
INTRAVENOUS | Status: DC | PRN
  Administered 2016-01-28: 10 mg via INTRAVENOUS
  Administered 2016-01-28: 50 mg via INTRAVENOUS
  Administered 2016-01-28: 30 mg via INTRAVENOUS
  Administered 2016-01-28: 20 mg via INTRAVENOUS
  Administered 2016-01-28: 10 mg via INTRAVENOUS

## 2016-01-28 MED ORDER — PROPOFOL 10 MG/ML IV BOLUS
INTRAVENOUS | Status: AC
  Filled 2016-01-28: qty 20

## 2016-01-28 MED ORDER — SUGAMMADEX SODIUM 200 MG/2ML IV SOLN
INTRAVENOUS | Status: DC | PRN
  Administered 2016-01-28: 200 mg via INTRAVENOUS

## 2016-01-28 MED ORDER — MIDAZOLAM HCL 2 MG/2ML IJ SOLN
INTRAMUSCULAR | Status: AC
  Filled 2016-01-28: qty 2

## 2016-01-28 MED ORDER — FENTANYL CITRATE (PF) 250 MCG/5ML IJ SOLN
INTRAMUSCULAR | Status: DC | PRN
  Administered 2016-01-28: 100 ug via INTRAVENOUS
  Administered 2016-01-28 (×2): 50 ug via INTRAVENOUS
  Administered 2016-01-28: 100 ug via INTRAVENOUS

## 2016-01-28 MED ORDER — LACTATED RINGERS IV SOLN
INTRAVENOUS | Status: DC
  Administered 2016-01-28 (×3): via INTRAVENOUS

## 2016-01-28 MED ORDER — FENTANYL CITRATE (PF) 100 MCG/2ML IJ SOLN
INTRAMUSCULAR | Status: AC
  Filled 2016-01-28: qty 4

## 2016-01-28 MED ORDER — LIDOCAINE HCL (CARDIAC) 20 MG/ML IV SOLN
INTRAVENOUS | Status: DC | PRN
  Administered 2016-01-28: 100 mg via INTRATRACHEAL

## 2016-01-28 MED ORDER — ROCURONIUM BROMIDE 10 MG/ML (PF) SYRINGE
PREFILLED_SYRINGE | INTRAVENOUS | Status: AC
  Filled 2016-01-28: qty 10

## 2016-01-28 MED ORDER — FAMOTIDINE IN NACL 20-0.9 MG/50ML-% IV SOLN
20.0000 mg | Freq: Two times a day (BID) | INTRAVENOUS | Status: DC
  Administered 2016-01-28 – 2016-01-30 (×5): 20 mg via INTRAVENOUS
  Filled 2016-01-28 (×7): qty 50

## 2016-01-28 MED ORDER — KCL IN DEXTROSE-NACL 20-5-0.9 MEQ/L-%-% IV SOLN
INTRAVENOUS | Status: DC
  Administered 2016-01-28: 1 mL via INTRAVENOUS
  Administered 2016-01-30 – 2016-02-01 (×3): via INTRAVENOUS
  Filled 2016-01-28 (×8): qty 1000

## 2016-01-28 MED ORDER — FENTANYL CITRATE (PF) 100 MCG/2ML IJ SOLN
INTRAMUSCULAR | Status: AC
  Filled 2016-01-28: qty 2

## 2016-01-28 MED ORDER — ONDANSETRON HCL 4 MG/2ML IJ SOLN: 4.0000 mg | Freq: Four times a day (QID) | INTRAMUSCULAR | Status: DC | PRN

## 2016-01-28 MED ORDER — MIDAZOLAM HCL 5 MG/5ML IJ SOLN
INTRAMUSCULAR | Status: DC | PRN
  Administered 2016-01-28: 2 mg via INTRAVENOUS

## 2016-01-28 MED ORDER — OXYCODONE HCL 5 MG PO TABS: 5.0000 mg | ORAL_TABLET | Freq: Once | ORAL | Status: DC | PRN

## 2016-01-28 MED ORDER — HYDROMORPHONE HCL 1 MG/ML IJ SOLN
INTRAMUSCULAR | Status: AC
  Administered 2016-01-28: 0.5 mg via INTRAVENOUS
  Filled 2016-01-28: qty 1

## 2016-01-28 MED ORDER — CHLORHEXIDINE GLUCONATE CLOTH 2 % EX PADS: 6.0000 | MEDICATED_PAD | Freq: Once | CUTANEOUS | Status: DC

## 2016-01-28 MED ORDER — POVIDONE-IODINE 10 % EX OINT
TOPICAL_OINTMENT | CUTANEOUS | Status: AC
  Filled 2016-01-28: qty 28.35

## 2016-01-28 MED ORDER — 0.9 % SODIUM CHLORIDE (POUR BTL) OPTIME
TOPICAL | Status: DC | PRN
  Administered 2016-01-28 (×6): 1000 mL

## 2016-01-28 MED ORDER — ALVIMOPAN 12 MG PO CAPS
ORAL_CAPSULE | ORAL | Status: AC
  Administered 2016-01-28: 12 mg via ORAL
  Filled 2016-01-28: qty 1

## 2016-01-28 MED ORDER — HYDROMORPHONE HCL 1 MG/ML IJ SOLN
0.2500 mg | INTRAMUSCULAR | Status: DC | PRN
  Administered 2016-01-28 (×2): 0.5 mg via INTRAVENOUS
  Administered 2016-01-28: 0.25 mg via INTRAVENOUS
  Administered 2016-01-28: 0.5 mg via INTRAVENOUS
  Administered 2016-01-28: 0.25 mg via INTRAVENOUS

## 2016-01-28 MED ORDER — SODIUM CHLORIDE 0.9 % IR SOLN
Status: DC | PRN
  Administered 2016-01-28: 1000 mL

## 2016-01-28 MED ORDER — BUPIVACAINE-EPINEPHRINE 0.25% -1:200000 IJ SOLN
INTRAMUSCULAR | Status: DC | PRN
  Administered 2016-01-28: 26 mL

## 2016-01-28 MED ORDER — PHENYLEPHRINE HCL 10 MG/ML IJ SOLN
INTRAMUSCULAR | Status: DC | PRN
  Administered 2016-01-28 (×4): 80 ug via INTRAVENOUS

## 2016-01-28 MED ORDER — ONDANSETRON 4 MG PO TBDP: 4.0000 mg | ORAL_TABLET | Freq: Four times a day (QID) | ORAL | Status: DC | PRN

## 2016-01-28 MED ORDER — HYDROMORPHONE HCL 1 MG/ML IJ SOLN
INTRAMUSCULAR | Status: AC
  Administered 2016-01-28: 0.25 mg via INTRAVENOUS
  Filled 2016-01-28: qty 1

## 2016-01-28 MED ORDER — ALVIMOPAN 12 MG PO CAPS
12.0000 mg | ORAL_CAPSULE | Freq: Two times a day (BID) | ORAL | Status: DC
  Administered 2016-01-29 – 2016-01-30 (×4): 12 mg via ORAL
  Filled 2016-01-28 (×4): qty 1

## 2016-01-28 MED ORDER — MORPHINE SULFATE (PF) 2 MG/ML IV SOLN
1.0000 mg | INTRAVENOUS | Status: DC | PRN
  Administered 2016-01-28 (×2): 4 mg via INTRAVENOUS
  Administered 2016-01-29 (×4): 2 mg via INTRAVENOUS
  Filled 2016-01-28 (×3): qty 1
  Filled 2016-01-28 (×3): qty 2
  Filled 2016-01-28: qty 1

## 2016-01-28 MED ORDER — ALVIMOPAN 12 MG PO CAPS
12.0000 mg | ORAL_CAPSULE | Freq: Once | ORAL | Status: AC
  Administered 2016-01-28: 12 mg via ORAL

## 2016-01-28 MED ORDER — ONDANSETRON HCL 4 MG/2ML IJ SOLN
INTRAMUSCULAR | Status: DC | PRN
  Administered 2016-01-28: 4 mg via INTRAVENOUS

## 2016-01-28 MED ORDER — HEPARIN SODIUM (PORCINE) 5000 UNIT/ML IJ SOLN
5000.0000 [IU] | Freq: Three times a day (TID) | INTRAMUSCULAR | Status: DC
  Administered 2016-01-29 – 2016-02-01 (×12): 5000 [IU] via SUBCUTANEOUS
  Filled 2016-01-28 (×13): qty 1

## 2016-01-28 MED ORDER — BUPIVACAINE-EPINEPHRINE (PF) 0.25% -1:200000 IJ SOLN
INTRAMUSCULAR | Status: AC
  Filled 2016-01-28: qty 30

## 2016-01-28 SURGICAL SUPPLY — 85 items
APPLIER CLIP ROT 10 11.4 M/L (STAPLE)
APR CLP MED LRG 11.4X10 (STAPLE)
BLADE SURG 10 STRL SS (BLADE) ×2 IMPLANT
BLADE SURG ROTATE 9660 (MISCELLANEOUS) IMPLANT
CANISTER SUCTION 2500CC (MISCELLANEOUS) ×3 IMPLANT
CELLS DAT CNTRL 66122 CELL SVR (MISCELLANEOUS) ×1 IMPLANT
CHLORAPREP W/TINT 26ML (MISCELLANEOUS) ×3 IMPLANT
CLIP APPLIE ROT 10 11.4 M/L (STAPLE) IMPLANT
COVER MAYO STAND STRL (DRAPES) ×6 IMPLANT
COVER SURGICAL LIGHT HANDLE (MISCELLANEOUS) ×6 IMPLANT
DRAIN PENROSE 1/2X12 LTX STRL (WOUND CARE) ×2 IMPLANT
DRAPE PROXIMA HALF (DRAPES) ×3 IMPLANT
DRAPE UTILITY XL STRL (DRAPES) ×3 IMPLANT
DRAPE WARM FLUID 44X44 (DRAPE) ×3 IMPLANT
DRSG OPSITE POSTOP 4X10 (GAUZE/BANDAGES/DRESSINGS) ×3 IMPLANT
DRSG OPSITE POSTOP 4X8 (GAUZE/BANDAGES/DRESSINGS) IMPLANT
DRSG TEGADERM 2-3/8X2-3/4 SM (GAUZE/BANDAGES/DRESSINGS) ×2 IMPLANT
ELECT BLADE 4.0 EZ CLEAN MEGAD (MISCELLANEOUS) ×3
ELECT BLADE 6.5 EXT (BLADE) ×3 IMPLANT
ELECT CAUTERY BLADE 6.4 (BLADE) ×6 IMPLANT
ELECT REM PT RETURN 9FT ADLT (ELECTROSURGICAL) ×3
ELECTRODE BLDE 4.0 EZ CLN MEGD (MISCELLANEOUS) ×1 IMPLANT
ELECTRODE REM PT RTRN 9FT ADLT (ELECTROSURGICAL) ×1 IMPLANT
GAUZE SPONGE 2X2 8PLY STRL LF (GAUZE/BANDAGES/DRESSINGS) ×1 IMPLANT
GLOVE BIO SURGEON STRL SZ 6 (GLOVE) ×4 IMPLANT
GLOVE BIO SURGEON STRL SZ 6.5 (GLOVE) ×1 IMPLANT
GLOVE BIO SURGEON STRL SZ7.5 (GLOVE) ×6 IMPLANT
GLOVE BIO SURGEONS STRL SZ 6.5 (GLOVE) ×1
GLOVE BIOGEL PI IND STRL 6.5 (GLOVE) ×2 IMPLANT
GLOVE BIOGEL PI INDICATOR 6.5 (GLOVE) ×4
GOWN STRL REUS W/ TWL LRG LVL3 (GOWN DISPOSABLE) ×8 IMPLANT
GOWN STRL REUS W/TWL LRG LVL3 (GOWN DISPOSABLE) ×24
KIT BASIN OR (CUSTOM PROCEDURE TRAY) ×3 IMPLANT
KIT ROOM TURNOVER OR (KITS) ×3 IMPLANT
LEGGING LITHOTOMY PAIR STRL (DRAPES) ×3 IMPLANT
LIGASURE IMPACT 36 18CM CVD LR (INSTRUMENTS) ×3 IMPLANT
NS IRRIG 1000ML POUR BTL (IV SOLUTION) ×6 IMPLANT
PAD ARMBOARD 7.5X6 YLW CONV (MISCELLANEOUS) ×6 IMPLANT
PENCIL BUTTON HOLSTER BLD 10FT (ELECTRODE) ×6 IMPLANT
RETRACTOR WND ALEXIS 18 MED (MISCELLANEOUS) IMPLANT
RTRCTR WOUND ALEXIS 18CM MED (MISCELLANEOUS) ×3
SCALPEL HARMONIC ACE (MISCELLANEOUS) ×3 IMPLANT
SCISSORS LAP 5X35 DISP (ENDOMECHANICALS) ×3 IMPLANT
SET IRRIG TUBING LAPAROSCOPIC (IRRIGATION / IRRIGATOR) IMPLANT
SLEEVE ENDOPATH XCEL 5M (ENDOMECHANICALS) ×10 IMPLANT
SPECIMEN JAR LARGE (MISCELLANEOUS) ×3 IMPLANT
SPONGE GAUZE 2X2 STER 10/PKG (GAUZE/BANDAGES/DRESSINGS) ×2
SPONGE LAP 18X18 X RAY DECT (DISPOSABLE) ×4 IMPLANT
STAPLER CIRC CVD 29MM 37CM (STAPLE) ×2 IMPLANT
STAPLER CUT CVD 40MM GREEN (STAPLE) ×3 IMPLANT
STAPLER PROXIMATE 75MM BLUE (STAPLE) ×2 IMPLANT
STAPLER VISISTAT 35W (STAPLE) ×3 IMPLANT
SUCTION POOLE TIP (SUCTIONS) ×2 IMPLANT
SURGILUBE 2OZ TUBE FLIPTOP (MISCELLANEOUS) ×3 IMPLANT
SUT PDS AB 1 CT  36 (SUTURE) ×4
SUT PDS AB 1 CT 36 (SUTURE) ×2 IMPLANT
SUT PDS AB 1 TP1 96 (SUTURE) ×6 IMPLANT
SUT PROLENE 2 0 CT2 30 (SUTURE) IMPLANT
SUT PROLENE 2 0 KS (SUTURE) IMPLANT
SUT PROLENE 2 0 SH 30 (SUTURE) ×2 IMPLANT
SUT SILK 2 0 (SUTURE) ×3
SUT SILK 2 0 SH CR/8 (SUTURE) ×3 IMPLANT
SUT SILK 2-0 18XBRD TIE 12 (SUTURE) ×1 IMPLANT
SUT SILK 3 0 (SUTURE) ×6
SUT SILK 3 0 SH CR/8 (SUTURE) ×3 IMPLANT
SUT SILK 3-0 18XBRD TIE 12 (SUTURE) ×1 IMPLANT
SUT VIC AB 0 CT1 27 (SUTURE) ×6
SUT VIC AB 0 CT1 27XBRD ANBCTR (SUTURE) IMPLANT
SUT VIC AB 2-0 SH 27 (SUTURE) ×3
SUT VIC AB 2-0 SH 27XBRD (SUTURE) IMPLANT
SYR BULB IRRIGATION 50ML (SYRINGE) ×6 IMPLANT
SYS LAPSCP GELPORT 120MM (MISCELLANEOUS) ×3
SYSTEM LAPSCP GELPORT 120MM (MISCELLANEOUS) IMPLANT
TOWEL OR 17X26 10 PK STRL BLUE (TOWEL DISPOSABLE) ×6 IMPLANT
TRAY FOLEY CATH 16FRSI W/METER (SET/KITS/TRAYS/PACK) ×3 IMPLANT
TRAY LAPAROSCOPIC MC (CUSTOM PROCEDURE TRAY) ×3 IMPLANT
TRAY PROCTOSCOPIC FIBER OPTIC (SET/KITS/TRAYS/PACK) ×3 IMPLANT
TROCAR XCEL 12X100 BLDLESS (ENDOMECHANICALS) IMPLANT
TROCAR XCEL BLUNT TIP 100MML (ENDOMECHANICALS) IMPLANT
TROCAR XCEL NON-BLD 11X100MML (ENDOMECHANICALS) IMPLANT
TROCAR XCEL NON-BLD 5MMX100MML (ENDOMECHANICALS) ×3 IMPLANT
TUBE CONNECTING 12'X1/4 (SUCTIONS) ×3
TUBE CONNECTING 12X1/4 (SUCTIONS) ×5 IMPLANT
TUBING FILTER THERMOFLATOR (ELECTROSURGICAL) ×3 IMPLANT
YANKAUER SUCT BULB TIP NO VENT (SUCTIONS) ×6 IMPLANT

## 2016-01-28 NOTE — H&P (Signed)
Bethany Frederick  Location: [facility] Patient #: [id number] DOB: [birth year] Married / Language: English / Race: White Female   History of Present Illness  Patient words: colon cancer.  The patient is a 65 year old female who presents with a complaint of Bloody stools. We are asked to see the patient in consultation by Dr. Watt Climes to evaluate her for a new rectosigmoid colon cancer. The patient is a 65 year old white female who initially went to her regular medical doc for a checkup. At that time she was found to have an elevated liver function. She also noted that she has had some blood with her bm's over the last few months. She denies any abdominal pain. She denies any constipation or diarrhea. She has been maintaining her weight well. She then had a colonoscopy which showed a mass near the rectosigmoid junction. 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 She also had a CT of the abdomen and pelvis which does not show distant disease. Her CEA was normal.   Allergies  No Known Drug Allergies  Medication History  Advil (100MG  Tablet Chewable, Oral) Active. Tylenol (325MG  Tablet, Oral) Active. Medications Reconciled    Review of Systems General Not Present- Appetite Loss, Chills, Fatigue, Fever, Night Sweats, Weight Gain and Weight Loss. Skin Not Present- Change in Wart/Mole, Dryness, Hives, Jaundice, New Lesions, Non-Healing Wounds, Rash and Ulcer. HEENT Not Present- Earache, Hearing Loss, Hoarseness, Nose Bleed, Oral Ulcers, Ringing in the Ears, Seasonal Allergies, Sinus Pain, Sore Throat, Visual Disturbances, Wears glasses/contact lenses and Yellow Eyes. Respiratory Not Present- Bloody sputum, Chronic Cough, Difficulty Breathing, Snoring and Wheezing. Breast Not Present- Breast Mass, Breast Pain, Nipple Discharge and Skin Changes. Cardiovascular Not Present- Chest Pain, Difficulty Breathing Lying Down, Leg Cramps, Palpitations, Rapid Heart Rate, Shortness of  Breath and Swelling of Extremities. Gastrointestinal Present- Bloody Stool. Not Present- Abdominal Pain, Bloating, Change in Bowel Habits, Chronic diarrhea, Constipation, Difficulty Swallowing, Excessive gas, Gets full quickly at meals, Hemorrhoids, Indigestion, Nausea, Rectal Pain and Vomiting. Female Genitourinary Not Present- Frequency, Nocturia, Painful Urination, Pelvic Pain and Urgency. Musculoskeletal Not Present- Back Pain, Joint Pain, Joint Stiffness, Muscle Pain, Muscle Weakness and Swelling of Extremities. Neurological Not Present- Decreased Memory, Fainting, Headaches, Numbness, Seizures, Tingling, Tremor, Trouble walking and Weakness. Psychiatric Not Present- Anxiety, Bipolar, Change in Sleep Pattern, Depression, Fearful and Frequent crying. Endocrine Not Present- Cold Intolerance, Excessive Hunger, Hair Changes, Heat Intolerance, Hot flashes and New Diabetes. Hematology Not Present- Easy Bruising, Excessive bleeding, Gland problems, HIV and Persistent Infections.  Vitals  Weight: 192.4 lb Height: 66in Body Surface Area: 1.97 m Body Mass Index: 31.05 kg/m  Temp.: 98.42F(Oral)  Pulse: 66 (Regular)  BP: 122/76 (Sitting, Left Arm, Standard)       Physical Exam  General Mental Status-Alert. General Appearance-Consistent with stated age. Hydration-Well hydrated. Voice-Normal.  Head and Neck Head-normocephalic, atraumatic with no lesions or palpable masses. Trachea-midline. Thyroid Gland Characteristics - normal size and consistency.  Eye Eyeball - Bilateral-Extraocular movements intact. Sclera/Conjunctiva - Bilateral-No scleral icterus.  Chest and Lung Exam Chest and lung exam reveals -quiet, even and easy respiratory effort with no use of accessory muscles and on auscultation, normal breath sounds, no adventitious sounds and normal vocal resonance. Inspection Chest Wall - Normal. Back - normal.  Cardiovascular Cardiovascular  examination reveals -normal heart sounds, regular rate and rhythm with no murmurs and normal pedal pulses bilaterally.  Abdomen Inspection Inspection of the abdomen reveals - No Hernias. Skin - Scar -  no surgical scars. Palpation/Percussion Palpation and Percussion of the abdomen reveal - Soft, Non Tender, No Rebound tenderness, No Rigidity (guarding) and No hepatosplenomegaly. Auscultation Auscultation of the abdomen reveals - Bowel sounds normal.  Rectal Note: There is normal rectal tone. On digital exam, I believe I can just barely feel the edge of the tumor with the tip of my finger   Neurologic Neurologic evaluation reveals -alert and oriented x 3 with no impairment of recent or remote memory. Mental Status-Normal.  Musculoskeletal Normal Exam - Left-Upper Extremity Strength Normal and Lower Extremity Strength Normal. Normal Exam - Right-Upper Extremity Strength Normal and Lower Extremity Strength Normal.  Lymphatic Head & Neck  General Head & Neck Lymphatics: Bilateral - Description - Normal. Axillary  General Axillary Region: Bilateral - Description - Normal. Tenderness - Non Tender. Femoral & Inguinal  Generalized Femoral & Inguinal Lymphatics: Bilateral - Description - Normal. Tenderness - Non Tender. 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 Her preoperative evaluation has shown no evidence of distant disease. Because of the risk of obstruction and progression of the cancer would recommend that she have this area of her colon removed. She will be a good candidate for a laparoscopic-assisted partial colectomy with primary anastomosis. I have discussed with her in detail the risks and benefits of the operation as well as some of the technical aspects and she understands and wishes to  proceed. Current Plans Pt Education - Colorectal Cancer: discussed with patient and provided information. Started Neomycin Sulfate 500MG , 2 (two) Tablet SEE NOTE, #6, 01/14/2016, No Refill. Local Order: TAKE TWO TABLETS AT 2 PM, 3 PM, AND 10 PM THE DAY PRIOR TO SURGERY Started Flagyl 500MG , 2 (two) Tablet SEE NOTE, #6, 01/14/2016, No Refill. Local Order: Take at 2pm, 3pm, and 10pm the day prior to your colon operation

## 2016-01-28 NOTE — Interval H&P Note (Signed)
History and Physical Interval Note:  01/28/2016 8:18 AM  Hurshel Party  has presented today for surgery, with the diagnosis of sigmoid colon cancer  The various methods of treatment have been discussed with the patient and family. After consideration of risks, benefits and other options for treatment, the patient has consented to  Procedure(s): LAPAROSCOPIC ASSISTED SIGMOID COLECTOMY  as a surgical intervention .  The patient's history has been reviewed, patient examined, no change in status, stable for surgery.  I have reviewed the patient's chart and labs.  Questions were answered to the patient's satisfaction.     TOTH III,PAUL S

## 2016-01-28 NOTE — Anesthesia Preprocedure Evaluation (Signed)
Anesthesia Evaluation  Patient identified by MRN, date of birth, ID band Patient awake    Reviewed: Allergy & Precautions, NPO status , Patient's Chart, lab work & pertinent test results  Airway Mallampati: II   Neck ROM: full    Dental   Pulmonary neg pulmonary ROS,  breath sounds clear to auscultation        Cardiovascular negative cardio ROS  Rhythm:regular Rate:Normal     Neuro/Psych    GI/Hepatic GERD-  ,  Endo/Other    Renal/GU      Musculoskeletal  (+) Arthritis -,   Abdominal   Peds  Hematology   Anesthesia Other Findings   Reproductive/Obstetrics                             Anesthesia Physical Anesthesia Plan  ASA: II  Anesthesia Plan: General   Post-op Pain Management:    Induction: Intravenous  Airway Management Planned: Oral ETT  Additional Equipment:   Intra-op Plan:   Post-operative Plan: Extubation in OR  Informed Consent: I have reviewed the patients History and Physical, chart, labs and discussed the procedure including the risks, benefits and alternatives for the proposed anesthesia with the patient or authorized representative who has indicated his/her understanding and acceptance.     Plan Discussed with: CRNA, Anesthesiologist and Surgeon  Anesthesia Plan Comments:         Anesthesia Quick Evaluation  

## 2016-01-28 NOTE — Anesthesia Postprocedure Evaluation (Signed)
Anesthesia Post Note  Patient: Bethany Frederick  Procedure(s) Performed: Procedure(s) (LRB): LAPAROSCOPIC ASSISTED SIGMOID COLECTOMY (Bilateral)  Patient location during evaluation: PACU Anesthesia Type: General Level of consciousness: awake and alert and patient cooperative Pain management: pain level controlled Vital Signs Assessment: post-procedure vital signs reviewed and stable Respiratory status: spontaneous breathing and respiratory function stable Cardiovascular status: stable Anesthetic complications: no    Last Vitals:  Vitals:   01/28/16 1326 01/28/16 1341  BP: 124/69 133/71  Pulse: 82 82  Resp: 16 14  Temp:      Last Pain:  Vitals:   01/28/16 1330  TempSrc:   PainSc: 10-Worst pain ever                 Vishruth Seoane S

## 2016-01-28 NOTE — Transfer of Care (Signed)
Immediate Anesthesia Transfer of Care Note  Patient: Bethany Frederick  Procedure(s) Performed: Procedure(s): LAPAROSCOPIC ASSISTED SIGMOID COLECTOMY (Bilateral)  Patient Location: PACU  Anesthesia Type:General  Level of Consciousness: awake, alert  and oriented  Airway & Oxygen Therapy: Patient Spontanous Breathing and Patient connected to face mask oxygen  Post-op Assessment: Report given to RN, Post -op Vital signs reviewed and stable and Patient moving all extremities X 4  Post vital signs: Reviewed and stable  Last Vitals:  Vitals:   01/28/16 0641 01/28/16 0642  BP: (!) 148/75   Pulse: 78   Resp: 20   Temp:  36.7 C    Last Pain:  Vitals:   01/28/16 0641  TempSrc: Oral         Complications: No apparent anesthesia complications

## 2016-01-28 NOTE — Anesthesia Procedure Notes (Signed)
Procedure Name: Intubation Date/Time: 01/28/2016 8:39 AM Performed by: Mariea Clonts Pre-anesthesia Checklist: Patient identified, Emergency Drugs available, Suction available and Patient being monitored Patient Re-evaluated:Patient Re-evaluated prior to inductionOxygen Delivery Method: Circle System Utilized Preoxygenation: Pre-oxygenation with 100% oxygen Intubation Type: IV induction Ventilation: Mask ventilation without difficulty Laryngoscope Size: Miller and 3 Grade View: Grade I Tube type: Oral Tube size: 7.5 mm Number of attempts: 1 Airway Equipment and Method: Stylet and Oral airway Placement Confirmation: ETT inserted through vocal cords under direct vision,  positive ETCO2 and breath sounds checked- equal and bilateral Tube secured with: Tape Dental Injury: Teeth and Oropharynx as per pre-operative assessment

## 2016-01-28 NOTE — Op Note (Addendum)
01/28/2016  12:26 PM  PATIENT:  Bethany Frederick  65 y.o. female  PRE-OPERATIVE DIAGNOSIS:  sigmoid colon cancer  POST-OPERATIVE DIAGNOSIS:  Rectal cancer  PROCEDURE:  Procedure(s): LAPAROSCOPIC ASSISTED LOW ANTERIOR RESECTION with mobilization of splenic flexure  SURGEON:  Surgeon(s) and Role:    * Jovita Kussmaul, MD - Primary  PHYSICIAN ASSISTANT:   ASSISTANTS: Dr. Barry Dienes   ANESTHESIA:   general  EBL:  Total I/O In: 2000 [I.V.:2000] Out: 375 [Urine:225; Blood:150]  BLOOD ADMINISTERED:none  DRAINS: none   LOCAL MEDICATIONS USED:  MARCAINE     SPECIMEN:  Source of Specimen:  rectosigmoid colon with additional donuts of anastamosis  DISPOSITION OF SPECIMEN:  PATHOLOGY  COUNTS:  NO incorrect instrument count and ACTION TAKEN: X-RAY(S) TAKEN with no retained instrument  TOURNIQUET:  * No tourniquets in log *  DICTATION: .Dragon Dictation   After informed consent was obtained the patient was brought to the operating room and placed in the supine position on the operating table. After adequate induction of general anesthesia the patient was moved in the lithotomy position and all pressure points were padded. An appropriate timeout was performed. Initially a rigid sigmoidoscopy was performed and the tumor was identified about 10 cm from the anal verge. Next the abdomen was prepped with ChloraPrep and the perineum was prepped with Betadine, the ChloraPrep was allowed to dry and then sterile dressings were applied in the usual fashion. Another timeout was performed. I was able to access the abdominal cavity using a 5 mm Optiview port on the right mid abdomen. This area was infiltrated with quarter percent Marcaine. A small stab incision was made with a 15 blade knife. The 5 mm Optiview port was then used to bluntly dissect through the layers of the abdominal wall under direct vision until access was gained to the abdominal cavity. The abdomen was then insufflated with carbon dioxide  without difficulty. 2 other 5 mm ports were placed under direct vision on the right side of the abdomen and 2 were also placed on the left side of the abdomen. There were some filmy adhesions along the anterior abdominal wall which were taken down sharply with the Harmonic scalpel. Next the left colon was mobilized by incising its retroperitoneal attachments along the white line of Toldt with the Harmonic scalpel. The splenic flexure was also mobilized by sharp dissection with the Harmonic scalpel. Once this portion of the colon was fairly free and mobile and then turned my attention to the pelvis. There was some adhesion around the rectosigmoid that made it difficult to see. At this point a lower midline incision was made with a 10 blade knife. The incision was carried through the skin and subcutaneous tissue sharply with electrocautery until the linea alba was identified. The linea alba was incised also with the electrocautery and the preperitoneal space was probed bluntly with a hemostat until the peritoneum was opened and access was gained to the abdominal cavity. The rest of the incision was opened under direct vision. A Bookwalter retractor was deployed. The rectosigmoid colon was dissected free from its retroperitoneal and pelvic attachments. The ureter was identified on both sides and care was taken to keep the dissection away from this area. The cancer was palpable below the peritoneal reflection of the rectum. A site was chosen for division of the rectosigmoid. The mesentery at this point was opened sharply with the electrocautery. A GIA-75 staplers placed across the colon at this point, clamped, and fired thereby dividing the  colon between staple lines. The rectosigmoid mesentery was then taken down sharply with the LigaSure. The mesocolon was also taken down sharply with the LigaSure. The dissection was carried below the palpable cancer. At that point the dissection of the mesentery was brought close to  the rectal wall. A green load contour stapler was then placed across the rectum below the palpable cancer, clamped, and fired thereby dividing the rectum between staple lines. The rectosigmoid was then removed. The distal staple line was marked with a stitch. The rectal stump appeared healthy. Next the sigmoid colon was examined and was very mobile and easily able to reach the rectal stump. The staple line of the sigmoid colon was then removed sharply with the electrocautery. A 2-0 Prolene pursestring stitch was placed around the opening of the sigmoid colon. EEA sizers were used to estimate the size of the stapling device used and we chose a 29 mm EEA stapler. The anvil was placed in the lumen of the proximal colon and the pursestring stitch was cinched down around the anvil and tied. At this point Dr. Barry Dienes Place the stapler into the rectum and positioned it at the rectal staple line. The spike was then deployed. The anvil was attached to the spike and the stapling device was closed making sure there were no twists to the colon. Once the stapling device was closed to the appropriate distance the stapling device was clamped and fired thereby creating a nice widely patent enteroenterostomy. A rigid sigmoidoscopy was performed with saline in the pelvis and there were no air bubbles noted. There was good distention of the sigmoid colon. The anastomosis appeared to be healthy with no tension. The abdomen was then irrigated with copious amounts of saline. The sponges and retractors were then removed. All gowns and gloves were changed. New drapes were placed around the abdominal cavity. The abdomen was again irrigated with copious amounts of saline. The fascia of the anterior abdominal wall was then closed with 2 running #1 double-stranded looped PDS sutures. Prior to closing the abdomen was inspected and there were no other significant abnormalities noted except for the hemangioma on the liver that was previously  visualized. The subtenon's tissue was irrigated with copious amounts of saline. The subcutaneous tissue was then closed with a running 2-0 Vicryl stitch. Skin incisions were closed with staples. Sterile dressings were applied. The patient tolerated the procedure well. At the end of the case there was an incorrect instrument count. An abdominal x-ray was obtained that showed no retained foreign body. The patient was then awakened and taken to recovery in stable condition.  PLAN OF CARE: Admit to inpatient   PATIENT DISPOSITION:  PACU - hemodynamically stable.   Delay start of Pharmacological VTE agent (>24hrs) due to surgical blood loss or risk of bleeding: no

## 2016-01-29 ENCOUNTER — Encounter (HOSPITAL_COMMUNITY): Payer: Self-pay | Admitting: General Surgery

## 2016-01-29 LAB — BASIC METABOLIC PANEL
ANION GAP: 7 (ref 5–15)
BUN: <5 mg/dL — ABNORMAL LOW (ref 6–20)
CALCIUM: 8.5 mg/dL — AB (ref 8.9–10.3)
CO2: 27 mmol/L (ref 22–32)
CREATININE: 0.62 mg/dL (ref 0.44–1.00)
Chloride: 106 mmol/L (ref 101–111)
Glucose, Bld: 135 mg/dL — ABNORMAL HIGH (ref 65–99)
Potassium: 3.6 mmol/L (ref 3.5–5.1)
Sodium: 140 mmol/L (ref 135–145)

## 2016-01-29 LAB — CBC
HCT: 39.7 % (ref 36.0–46.0)
Hemoglobin: 12.6 g/dL (ref 12.0–15.0)
MCH: 28.5 pg (ref 26.0–34.0)
MCHC: 31.7 g/dL (ref 30.0–36.0)
MCV: 89.8 fL (ref 78.0–100.0)
PLATELETS: 178 10*3/uL (ref 150–400)
RBC: 4.42 MIL/uL (ref 3.87–5.11)
RDW: 14.7 % (ref 11.5–15.5)
WBC: 9.4 10*3/uL (ref 4.0–10.5)

## 2016-01-29 NOTE — Progress Notes (Signed)
1 Day Post-Op  Subjective: She has very little pain except when she gets up. No nausea  Objective: Vital signs in last 24 hours: Temp:  [97.7 F (36.5 C)-98.4 F (36.9 C)] 98.2 F (36.8 C) (09/22 0417) Pulse Rate:  [77-91] 88 (09/22 0417) Resp:  [10-19] 19 (09/22 0417) BP: (112-133)/(61-78) 128/61 (09/22 0417) SpO2:  [97 %-100 %] 99 % (09/22 0417)    Intake/Output from previous day: 09/21 0701 - 09/22 0700 In: 3328.3 [P.O.:360; I.V.:2918.3; IV Piggyback:50] Out: 675 [Urine:525; Blood:150] Intake/Output this shift: Total I/O In: 120 [P.O.:120] Out: 1200 [Urine:1200]  Resp: clear to auscultation bilaterally Cardio: regular rate and rhythm GI: soft, mild tenderness near incision. few bs  Lab Results:   Recent Labs  01/29/16 0454  WBC 9.4  HGB 12.6  HCT 39.7  PLT 178   BMET  Recent Labs  01/29/16 0454  NA 140  K 3.6  CL 106  CO2 27  GLUCOSE 135*  BUN <5*  CREATININE 0.62  CALCIUM 8.5*   PT/INR No results for input(s): LABPROT, INR in the last 72 hours. ABG No results for input(s): PHART, HCO3 in the last 72 hours.  Invalid input(s): PCO2, PO2  Studies/Results: Dg Or Local Abdomen  Result Date: 01/28/2016 CLINICAL DATA:  65 year old female with incorrect instrument count in the operating room. Colon cancer, laparoscopic assisted partial colectomy with primary anastomosis. Initial encounter. EXAM: OR LOCAL ABDOMEN COMPARISON:  CT Abdomen and Pelvis 01/05/2016 FINDINGS: Portable AP supine view at 1224 hours. Midline skin staples from the lower lumbar spine to just above the pubic symphysis. Internal GI staples project in the rectosigmoid region. There are several other scattered skin type staples seen including a similar-sized linear metallic object projecting over the left SI joint. No retained surgical instrument or other radiopaque foreign body identified. Visible bowel gas pattern within normal limits. No acute osseous abnormality identified. IMPRESSION:  Expected postoperative changes. No retained surgical instrument or radiopaque foreign body identified. This was discussed by telephone with the OR on 01/28/2016 at 12:43 . Electronically Signed   By: Genevie Ann M.D.   On: 01/28/2016 12:43    Anti-infectives: Anti-infectives    Start     Dose/Rate Route Frequency Ordered Stop   01/28/16 0830  metroNIDAZOLE (FLAGYL) IVPB 500 mg     500 mg 100 mL/hr over 60 Minutes Intravenous To ShortStay Surgical 01/27/16 1100 01/28/16 0849   01/28/16 0800  cefTRIAXone (ROCEPHIN) 2 g in dextrose 5 % 50 mL IVPB     2 g 100 mL/hr over 30 Minutes Intravenous To ShortStay Surgical 01/27/16 1100 01/28/16 0844      Assessment/Plan: s/p Procedure(s): LAPAROSCOPIC ASSISTED SIGMOID COLECTOMY (Bilateral) Advance diet. Allow sips of clears today Continue entereg SCD's and heparin sq for vte prophylaxis ambulate  LOS: 1 day    TOTH III,Teleah Villamar S 01/29/2016

## 2016-01-30 NOTE — Progress Notes (Signed)
Offered pt. A bath and pt. Refused stated she got a really good bath yesterday so she does not need one today

## 2016-01-30 NOTE — Progress Notes (Signed)
2 Days Post-Op  Subjective: Doing well. So but no severe pain. Tolerating clear liquids without nausea. He has passed flatus, no bowel movement yet. Foley catheter removed this morning.  Objective: Vital signs in last 24 hours: Temp:  [98.2 F (36.8 C)-99.3 F (37.4 C)] 99 F (37.2 C) (09/23 0525) Pulse Rate:  [81-94] 85 (09/23 0525) Resp:  [18] 18 (09/23 0525) BP: (116-138)/(63-72) 138/64 (09/23 0525) SpO2:  [96 %-98 %] 96 % (09/23 0525)    Intake/Output from previous day: 09/22 0701 - 09/23 0700 In: 3928.3 [P.O.:1500; I.V.:2328.3; IV Piggyback:100] Out: 2550 [Urine:2550] Intake/Output this shift: No intake/output data recorded.  General appearance: alert, cooperative and no distress GI: Minimal distention. Soft with very mild appropriate incisional tenderness. Incision/Wound: No erythema or drainage. Dressings dry and intact  Lab Results:   Recent Labs  01/29/16 0454  WBC 9.4  HGB 12.6  HCT 39.7  PLT 178   BMET  Recent Labs  01/29/16 0454  NA 140  K 3.6  CL 106  CO2 27  GLUCOSE 135*  BUN <5*  CREATININE 0.62  CALCIUM 8.5*     Studies/Results: Dg Or Local Abdomen  Result Date: 01/28/2016 CLINICAL DATA:  65 year old female with incorrect instrument count in the operating room. Colon cancer, laparoscopic assisted partial colectomy with primary anastomosis. Initial encounter. EXAM: OR LOCAL ABDOMEN COMPARISON:  CT Abdomen and Pelvis 01/05/2016 FINDINGS: Portable AP supine view at 1224 hours. Midline skin staples from the lower lumbar spine to just above the pubic symphysis. Internal GI staples project in the rectosigmoid region. There are several other scattered skin type staples seen including a similar-sized linear metallic object projecting over the left SI joint. No retained surgical instrument or other radiopaque foreign body identified. Visible bowel gas pattern within normal limits. No acute osseous abnormality identified. IMPRESSION: Expected  postoperative changes. No retained surgical instrument or radiopaque foreign body identified. This was discussed by telephone with the OR on 01/28/2016 at 12:43 . Electronically Signed   By: Genevie Ann M.D.   On: 01/28/2016 12:43    Anti-infectives: Anti-infectives    Start     Dose/Rate Route Frequency Ordered Stop   01/28/16 0830  metroNIDAZOLE (FLAGYL) IVPB 500 mg     500 mg 100 mL/hr over 60 Minutes Intravenous To ShortStay Surgical 01/27/16 1100 01/28/16 0849   01/28/16 0800  cefTRIAXone (ROCEPHIN) 2 g in dextrose 5 % 50 mL IVPB     2 g 100 mL/hr over 30 Minutes Intravenous To ShortStay Surgical 01/27/16 1100 01/28/16 0844      Assessment/Plan: s/p Procedure(s): LAPAROSCOPIC ASSISTED SIGMOID COLECTOMY Doing well. Diet advance to full liquids. Ambulation encouraged.   LOS: 2 days    Adeyemi Hamad T 9/23/2017Patient ID: Bethany Frederick, female   DOB: 1950/10/28, 65 y.o.   MRN: [id number]

## 2016-01-30 NOTE — Progress Notes (Signed)
Orthopedic Tech Progress Note Patient Details:  Bethany Frederick 1950-07-16 [id number]  Ortho Devices Type of Ortho Device: Abdominal binder   [name] 01/30/2016, 10:41 AM

## 2016-01-31 MED ORDER — FAMOTIDINE 20 MG PO TABS
20.0000 mg | ORAL_TABLET | Freq: Two times a day (BID) | ORAL | Status: DC
  Administered 2016-01-31 – 2016-02-02 (×5): 20 mg via ORAL
  Filled 2016-01-31 (×3): qty 1
  Filled 2016-01-31: qty 2
  Filled 2016-01-31: qty 1

## 2016-01-31 NOTE — Progress Notes (Signed)
3 Days Post-Op  Subjective: No complaints. Had a small bowel movement. Tolerating full liquids but not much appetite  Objective: Vital signs in last 24 hours: Temp:  [98.3 F (36.8 C)-98.4 F (36.9 C)] 98.3 F (36.8 C) (09/24 0548) Pulse Rate:  [77-95] 82 (09/24 0548) Resp:  [17-18] 17 (09/23 2146) BP: (128-142)/(64-65) 131/65 (09/24 0548) SpO2:  [96 %-97 %] 96 % (09/24 0548) Last BM Date: 01/30/16  Intake/Output from previous day: 09/23 0701 - 09/24 0700 In: 2087.5 [P.O.:720; I.V.:1317.5; IV Piggyback:50] Out: 1800 [Urine:1800] Intake/Output this shift: Total I/O In: -  Out: 600 [Urine:600]  General appearance: alert, cooperative and no distress GI: normal findings: soft, non-tender Incision/Wound: Clean and dry  Lab Results:   Recent Labs  01/29/16 0454  WBC 9.4  HGB 12.6  HCT 39.7  PLT 178   BMET  Recent Labs  01/29/16 0454  NA 140  K 3.6  CL 106  CO2 27  GLUCOSE 135*  BUN <5*  CREATININE 0.62  CALCIUM 8.5*     Studies/Results: No results found.  Anti-infectives: Anti-infectives    Start     Dose/Rate Route Frequency Ordered Stop   01/28/16 0830  metroNIDAZOLE (FLAGYL) IVPB 500 mg     500 mg 100 mL/hr over 60 Minutes Intravenous To [facility] 01/27/16 1100 01/28/16 0849   01/28/16 0800  cefTRIAXone (ROCEPHIN) 2 g in dextrose 5 % 50 mL IVPB     2 g 100 mL/hr over 30 Minutes Intravenous To [facility]Stay [facility] 01/27/16 1100 01/28/16 0844      Assessment/Plan: s/p Procedure(s): LAPAROSCOPIC ASSISTED SIGMOID COLECTOMY Doing well without apparent complication. Advance diet and expect discharge tomorrow   LOS: 3 days    Zamari Bonsall T 9/24/2017Patient ID: Bethany Frederick, female   DOB: 08-Mar-1951, 65 y.o.   MRN: [id number]

## 2016-02-01 ENCOUNTER — Encounter (HOSPITAL_COMMUNITY): Payer: Self-pay | Admitting: General Practice

## 2016-02-01 NOTE — Progress Notes (Signed)
4 Days Post-Op  Subjective: Feels better. Still hasn't had a significant bm  Objective: Vital signs in last 24 hours: Temp:  [98.4 F (36.9 C)-98.5 F (36.9 C)] 98.5 F (36.9 C) (09/24 2057) Pulse Rate:  [85-88] 88 (09/24 2057) Resp:  [18] 18 (09/24 2057) BP: (126-130)/(66-79) 130/66 (09/24 2057) SpO2:  [97 %-100 %] 100 % (09/24 2057) Last BM Date: 01/30/16  Intake/Output from previous day: 09/24 0701 - 09/25 0700 In: 2237.5 [P.O.:1080; I.V.:1157.5] Out: 4200 [Urine:4200] Intake/Output this shift: Total I/O In: -  Out: 600 [Urine:600]  Resp: clear to auscultation bilaterally Cardio: regular rate and rhythm GI: soft, minimal tenderness. incisions look good. good bs  Lab Results:  No results for input(s): WBC, HGB, HCT, PLT in the last 72 hours. BMET No results for input(s): NA, K, CL, CO2, GLUCOSE, BUN, CREATININE, CALCIUM in the last 72 hours. PT/INR No results for input(s): LABPROT, INR in the last 72 hours. ABG No results for input(s): PHART, HCO3 in the last 72 hours.  Invalid input(s): PCO2, PO2  Studies/Results: No results found.  Anti-infectives: Anti-infectives    Start     Dose/Rate Route Frequency Ordered Stop   01/28/16 0830  metroNIDAZOLE (FLAGYL) IVPB 500 mg     500 mg 100 mL/hr over 60 Minutes Intravenous To [facility] 01/27/16 1100 01/28/16 0849   01/28/16 0800  cefTRIAXone (ROCEPHIN) 2 g in dextrose 5 % 50 mL IVPB     2 g 100 mL/hr over 30 Minutes Intravenous To [facility]Stay [facility] 01/27/16 1100 01/28/16 0844      Assessment/Plan: s/p Procedure(s): LAPAROSCOPIC ASSISTED SIGMOID COLECTOMY (Bilateral) Advance diet  Ambulate Plan for discharge tomorrow  LOS: 4 days    TOTH III,PAUL S 02/01/2016

## 2016-02-02 MED ORDER — HYDROCODONE-ACETAMINOPHEN 5-325 MG PO TABS: 1.0000 | ORAL_TABLET | ORAL | 0 refills | Status: DC | PRN

## 2016-02-02 NOTE — Progress Notes (Signed)
5 Days Post-Op  Subjective: Feels good. Had a good bm yesterday  Objective: Vital signs in last 24 hours: Temp:  [98.2 F (36.8 C)-98.6 F (37 C)] 98.2 F (36.8 C) (09/26 0513) Pulse Rate:  [76-100] 87 (09/26 0513) Resp:  [16-18] 16 (09/26 0513) BP: (127-137)/(59-82) 127/82 (09/26 0513) SpO2:  [97 %-100 %] 97 % (09/26 0513) Last BM Date: 02/01/16  Intake/Output from previous day: 09/25 0701 - 09/26 0700 In: 1850 [P.O.:600; I.V.:1250] Out: 2950 [Urine:2950] Intake/Output this shift: No intake/output data recorded.  Resp: clear to auscultation bilaterally Cardio: regular rate and rhythm GI: soft, minimal tenderness. incisions look good  Lab Results:  No results for input(s): WBC, HGB, HCT, PLT in the last 72 hours. BMET No results for input(s): NA, K, CL, CO2, GLUCOSE, BUN, CREATININE, CALCIUM in the last 72 hours. PT/INR No results for input(s): LABPROT, INR in the last 72 hours. ABG No results for input(s): PHART, HCO3 in the last 72 hours.  Invalid input(s): PCO2, PO2  Studies/Results: No results found.  Anti-infectives: Anti-infectives    Start     Dose/Rate Route Frequency Ordered Stop   01/28/16 0830  metroNIDAZOLE (FLAGYL) IVPB 500 mg     500 mg 100 mL/hr over 60 Minutes Intravenous To [facility]Stay [facility] 01/27/16 1100 01/28/16 0849   01/28/16 0800  cefTRIAXone (ROCEPHIN) 2 g in dextrose 5 % 50 mL IVPB     2 g 100 mL/hr over 30 Minutes Intravenous To ShortStay Surgical 01/27/16 1100 01/28/16 0844      Assessment/Plan: s/p Procedure(s): LAPAROSCOPIC ASSISTED SIGMOID COLECTOMY (Bilateral) Discharge  LOS: 5 days    TOTH III,Nicolis Boody S 02/02/2016

## 2016-02-02 NOTE — Progress Notes (Signed)
Patient discharged to home with instructions and prescription. 

## 2016-02-03 NOTE — Discharge Summary (Signed)
Physician Discharge Summary  Patient ID: Bethany Frederick MRN: [id number] DOB/AGE: 06-23-1950 65 y.o.  Admit date: 01/28/2016 Discharge date: 02/03/2016  Admission Diagnoses:  Discharge Diagnoses:  Active Problems:   Rectal cancer [facility])   Discharged Condition: good  Hospital Course: The patient underwent a low anterior resection for a rectal cancer. She was also given entereg. She tolerated the surgery well. She gradually improved without any significant events and on pod 5 was ready for discharge home.  Consults: None  Significant Diagnostic Studies: none  Treatments: surgery: as above  Discharge Exam: Blood pressure 127/82, pulse 87, temperature 98.2 F (36.8 C), temperature source Oral, resp. rate 16, height 5\' 7"  (1.702 m), weight 87.1 kg (192 lb), SpO2 97 %. Resp: clear to auscultation bilaterally Cardio: regular rate and rhythm GI: soft, nontender. incisions look good  Disposition: 01-Home or Self Care  Discharge Instructions    Call MD for:  difficulty breathing, headache or visual disturbances    Complete by:  As directed    Call MD for:  extreme fatigue    Complete by:  As directed    Call MD for:  hives    Complete by:  As directed    Call MD for:  persistant dizziness or light-headedness    Complete by:  As directed    Call MD for:  persistant nausea and vomiting    Complete by:  As directed    Call MD for:  redness, tenderness, or signs of infection (pain, swelling, redness, odor or green/yellow discharge around incision site)    Complete by:  As directed    Call MD for:  severe uncontrolled pain    Complete by:  As directed    Call MD for:  temperature >100.4    Complete by:  As directed    Diet - low sodium heart healthy    Complete by:  As directed    Discharge instructions    Complete by:  As directed    May shower. No heavy lifting. Diet as tolerated   Increase activity slowly    Complete by:  As directed    No wound care    Complete by:  As  directed        Medication List    TAKE these medications   clobetasol cream 0.05 % Commonly known as:  TEMOVATE Apply 1 application topically daily as needed (RASH).   famotidine 20 MG tablet Commonly known as:  PEPCID Take 20 mg by mouth 2 (two) times daily as needed for heartburn or indigestion.   HYDROcodone-acetaminophen 5-325 MG tablet Commonly known as:  NORCO/VICODIN Take 1-2 tablets by mouth every 4 (four) hours as needed for moderate pain or severe pain.   metroNIDAZOLE 500 MG tablet Commonly known as:  FLAGYL Take 500 mg by mouth once. Before procedure as directed   neomycin 500 MG tablet Commonly known as:  MYCIFRADIN TAKE 2 TABLETS BY MOUTH AT 2 PM, 3 PM, AND 10 PM THE DAY PRIOR TO SURGERY      Follow-up Information    TOTH III,Kharizma Lesnick S, MD Follow up in 1 week(s).   Specialty:  General Surgery Contact information: [address] [place] [address] [phone]           Signed: Merrie Roof 02/03/2016, 2:57 PM

## 2016-02-12 ENCOUNTER — Other Ambulatory Visit (HOSPITAL_BASED_OUTPATIENT_CLINIC_OR_DEPARTMENT_OTHER): Payer: Federal, State, Local not specified - PPO

## 2016-02-12 ENCOUNTER — Ambulatory Visit (HOSPITAL_BASED_OUTPATIENT_CLINIC_OR_DEPARTMENT_OTHER): Payer: Federal, State, Local not specified - PPO | Admitting: Hematology

## 2016-02-12 ENCOUNTER — Encounter: Payer: Self-pay | Admitting: Hematology

## 2016-02-12 ENCOUNTER — Telehealth: Payer: Self-pay | Admitting: Hematology

## 2016-02-12 ENCOUNTER — Ambulatory Visit: Payer: Federal, State, Local not specified - PPO

## 2016-02-12 VITALS — BP 135/56 | HR 71 | Temp 98.5°F | Resp 17 | Ht 67.0 in | Wt 190.7 lb

## 2016-02-12 DIAGNOSIS — C19 Malignant neoplasm of rectosigmoid junction: Secondary | ICD-10-CM

## 2016-02-12 DIAGNOSIS — Z8042 Family history of malignant neoplasm of prostate: Secondary | ICD-10-CM

## 2016-02-12 DIAGNOSIS — C20 Malignant neoplasm of rectum: Secondary | ICD-10-CM

## 2016-02-12 DIAGNOSIS — Z803 Family history of malignant neoplasm of breast: Secondary | ICD-10-CM | POA: Diagnosis not present

## 2016-02-12 DIAGNOSIS — Z8041 Family history of malignant neoplasm of ovary: Secondary | ICD-10-CM

## 2016-02-12 DIAGNOSIS — N6092 Unspecified benign mammary dysplasia of left breast: Secondary | ICD-10-CM

## 2016-02-12 LAB — CBC WITH DIFFERENTIAL/PLATELET
BASO%: 3.2 % — ABNORMAL HIGH (ref 0.0–2.0)
Basophils Absolute: 0.2 10*3/uL — ABNORMAL HIGH (ref 0.0–0.1)
EOS%: 6.4 % (ref 0.0–7.0)
Eosinophils Absolute: 0.5 10*3/uL (ref 0.0–0.5)
HCT: 39.5 % (ref 34.8–46.6)
HGB: 12.7 g/dL (ref 11.6–15.9)
LYMPH%: 22.5 % (ref 14.0–49.7)
MCH: 28.2 pg (ref 25.1–34.0)
MCHC: 32.2 g/dL (ref 31.5–36.0)
MCV: 87.5 fL (ref 79.5–101.0)
MONO#: 0.6 10*3/uL (ref 0.1–0.9)
MONO%: 7.6 % (ref 0.0–14.0)
NEUT#: 4.4 10*3/uL (ref 1.5–6.5)
NEUT%: 60.3 % (ref 38.4–76.8)
Platelets: 261 10*3/uL (ref 145–400)
RBC: 4.52 10*6/uL (ref 3.70–5.45)
RDW: 14.6 % — ABNORMAL HIGH (ref 11.2–14.5)
WBC: 7.3 10*3/uL (ref 3.9–10.3)
lymph#: 1.6 10*3/uL (ref 0.9–3.3)

## 2016-02-12 LAB — COMPREHENSIVE METABOLIC PANEL
ALBUMIN: 3.5 g/dL (ref 3.5–5.0)
ALK PHOS: 270 U/L — AB (ref 40–150)
ALT: 30 U/L (ref 0–55)
ANION GAP: 10 meq/L (ref 3–11)
AST: 16 U/L (ref 5–34)
BILIRUBIN TOTAL: 0.38 mg/dL (ref 0.20–1.20)
BUN: 13.5 mg/dL (ref 7.0–26.0)
CALCIUM: 9.5 mg/dL (ref 8.4–10.4)
CO2: 25 mEq/L (ref 22–29)
CREATININE: 0.8 mg/dL (ref 0.6–1.1)
Chloride: 108 mEq/L (ref 98–109)
EGFR: 80 mL/min/{1.73_m2} — ABNORMAL LOW (ref >90)
Glucose: 115 mg/dl (ref 70–140)
Potassium: 4.1 mEq/L (ref 3.5–5.1)
SODIUM: 142 meq/L (ref 136–145)
TOTAL PROTEIN: 6.6 g/dL (ref 6.4–8.3)

## 2016-02-12 LAB — CEA (IN HOUSE-CHCC): CEA (CHCC-IN HOUSE): 1.56 ng/mL (ref 0.00–5.00)

## 2016-02-12 NOTE — Telephone Encounter (Signed)
Avs report and appointment schedule given to  Patient per 02/12/16 los.

## 2016-02-12 NOTE — Progress Notes (Signed)
[place]  Telephone:([phone] Fax:(336) [phone]  Clinic Follow up Note   Patient Care Team: [name], MD as PCP - General (Family Medicine) 02/12/2016  REFERRAL PHYSICIAN: Dr. Marlou Starks  CHIEF COMPLAINTS:  Recently diagnosed stage III colon cancer  Oncology History   Rectal cancer [facility] [place])   Staging form: Colon and Rectum, AJCC 7th Edition   - Pathologic stage from 01/28/2016: Stage IIIC (T3, N2b, cM0) - Signed by Truitt Merle, MD on 02/12/2016      Colorectal cancer ([place])   01/04/2016 Procedure    Colonoscopy by Dr. Estanislado Emms showed a partially obstructing tumor in the rectal sigmoid colon, biopsied.      01/05/2016 Imaging    CT abdomen and pelvis with contrast showed no dominant sigmoid colon mass, soft tissue fullness at the rectosigmoid junction, suspicious for the primary lesion. Borderline enlarged nodes in the sigmoid mesocolon and at the origin of the inferior mesenteric artery are indeterminate no evidence of December metastasis. Left hepatic lobe giant hemangioma.       01/28/2016 Initial Diagnosis    Rectal cancer ([place])     01/28/2016 Surgery    Endoscopic low anterior resection of rectosigmoid segmental colon for tumor       01/28/2016 Pathology Results    Invasive colonic adenocarcinoma, 4.5 cm extending into her chronic connective tissue, lymphovascular involvement by tumor, margins were negative, metastatic carcinoma in 9 of 21 lymph nodes.       HISTORY OF PRESENTING ILLNESS (05/14/2015):  Bethany Frederick 65 y.o. female is here to discussed her risk of breast cancer.   She has had palpable left breast mass for over 20 years, unchanged. Her mammogram in 2014 showed a 0.8 cm cyst. She does screening mammogram once a year. The lesion looks slightly more suspicious on the mammogram in August 2016, and a biopsy was recommended, which showed sclerosing lesion with typical ductal hyperplasia. She was referred to see Dr. Marlou Starks and underwent left breast  lumpectomy. She tolerated the surgery very well, and has recovered completely.  She feels well, has mild fatigue, she takes care two of her brothers, who live alone and have health issues. She lives with her hsuband and 26 yo son. She remains physically active, does not exercise regularly. She has mild arthritis, takes ibuprofen and Aleve as needed, no other complaints.  GYN HISTORY  Menarchal: 10 LMP: 89 Contraceptive: 24 years  HRT: 5 years, stopped at age of 69  G1P1: one son, she was 43 yo when she had her son   INTERIM HISTORY  Fritz Pickerel returns for follow-up. She is accompanied by her husband to clinic today. I previously saw her 9 months ago for her high-risk for breast cancer. She started having diarrhea and small rectal bleeding in Feb 2017, no weight loss or other symptoms. She changed her primary care physician, and underwent some lab and image test, finally she was referred to Dr. Estanislado Emms for colonoscopy in August. Unfortunately, she was found to have a large partially obstructing mass in the rectosigmoid colon, biopsy showed adenocarcinoma. She underwent low anterior resection of her colon cancer on 01/28/2016.   She has recovered well from surgery, staples removed, she is on antibiotcis for small wound infection. Her appetite has been back to normal, energy level back to 75% of her baseline. She has been tolerating routine daily activities without much difficulties. No segmental pain at the incision. No other new complaints.   MEDICAL HISTORY:  Past Medical History:  Diagnosis  Date  . Arthritis    back, hip, feet  . Cancer ([place])    Sigmoid colon  . Cataract, immature   . GERD (gastroesophageal reflux disease)   . Heart palpitations   . 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 Seasonal allergies   . Wears partial dentures    upper front - 1 tooth    SURGICAL HISTORY: Past Surgical History:  Procedure Laterality Date  . APPENDECTOMY    . BREAST LUMPECTOMY WITH  RADIOACTIVE SEED LOCALIZATION Left 03/13/2015   Procedure: BREAST LUMPECTOMY WITH RADIOACTIVE SEED LOCALIZATION;  Surgeon: Autumn Messing III, MD;  Location: [place];  Service: General;  Laterality: Left;  . COLONOSCOPY    . ENDOMETRIAL FULGURATION  06/13/2000  . INGUINAL HERNIA REPAIR  03/05/2012   Procedure: HERNIA REPAIR INGUINAL INCARCERATED;  Surgeon: Ralene Ok, MD;  Location: [place];  Service: General;  Laterality: N/A;  . INSERTION OF MESH  03/05/2012   Procedure: INSERTION OF MESH;  Surgeon: Ralene Ok, MD;  Location: [place];  Service: General;  Laterality: Left;  . LAPAROSCOPIC SIGMOID COLECTOMY Bilateral 01/28/2016   Procedure: LAPAROSCOPIC ASSISTED SIGMOID COLECTOMY;  Surgeon: Autumn Messing III, MD;  Location: [place];  Service: General;  Laterality: Bilateral;  . MYOMECTOMY  06/13/2000  . ROTATOR CUFF REPAIR Left    2013    SOCIAL HISTORY: Social History   Social History  . Marital status: Married    Spouse name: N/A  . Number of children: N/A  . Years of education: N/A   Occupational History  . Not on file.   Social History Main Topics  . Smoking status: Never Smoker  . Smokeless tobacco: Never Used  . Alcohol use Yes     Comment: occasionally  . Drug use: No  . Sexual activity: Not Currently   Other Topics Concern  . Not on file   Social History Narrative  . No narrative on file    FAMILY HISTORY: Family History  Problem Relation Age of Onset  . Cancer Brother 5    prostate cancer   . Cancer Paternal Aunt 61    breast cancer   . Cancer Cousin 40    ovarian and breast cancer (maternal cousin)     ALLERGIES:  is allergic to no known allergies.  MEDICATIONS:  Current Outpatient Prescriptions  Medication Sig Dispense Refill  . clobetasol cream (TEMOVATE) 0.93 % Apply 1 application topically daily as needed (RASH).     . famotidine (PEPCID) 20 MG tablet Take 20 mg by mouth 2 (two) times daily as needed for heartburn or indigestion.   0    . HYDROcodone-acetaminophen (NORCO/VICODIN) 5-325 MG tablet Take 1-2 tablets by mouth every 4 (four) hours as needed for moderate pain or severe pain. (Patient not taking: Reported on 02/12/2016) 30 tablet 0  . PREVNAR 13 SUSP injection     . ZOSTAVAX 26712 UNT/0.65ML injection      No current facility-administered medications for this visit.     REVIEW OF SYSTEMS:   Constitutional: Denies fevers, chills or abnormal night sweats Eyes: Denies blurriness of vision, double vision or watery eyes Ears, nose, mouth, throat, and face: Denies mucositis or sore throat Respiratory: Denies cough, dyspnea or wheezes Cardiovascular: Denies palpitation, chest discomfort or lower extremity swelling Gastrointestinal:  Denies nausea, heartburn or change in bowel habits Skin: Denies abnormal skin rashes Lymphatics: Denies new lymphadenopathy or easy bruising Neurological:Denies numbness, tingling or new weaknesses Behavioral/Psych: Mood is stable, no new changes  All other systems were reviewed with the patient and are negative.  PHYSICAL EXAMINATION: ECOG PERFORMANCE STATUS: 1 - Symptomatic but completely ambulatory  Vitals:   02/12/16 1412  BP: (!) 135/56  Pulse: 71  Resp: 17  Temp: 98.5 F (36.9 C)   Filed Weights   02/12/16 1412  Weight: 190 lb 11.2 oz (86.5 kg)    GENERAL:alert, no distress and comfortable SKIN: skin color, texture, turgor are normal, no rashes or significant lesions EYES: normal, conjunctiva are pink and non-injected, sclera clear OROPHARYNX:no exudate, no erythema and lips, buccal mucosa, and tongue normal  NECK: supple, thyroid normal size, non-tender, without nodularity LYMPH:  no palpable lymphadenopathy in the cervical, axillary or inguinal LUNGS: clear to auscultation and percussion with normal breathing effort HEART: regular rate & rhythm and no murmurs and no lower extremity edema ABDOMEN:abdomen soft, non-tender and normal bowel sounds, midline incision has  healed well, no discharge or skin erythema.  Musculoskeletal:no cyanosis of digits and no clubbing  PSYCH: alert & oriented x 3 with fluent speech NEURO: no focal motor/sensory deficits  LABORATORY DATA:  I have reviewed the data as listed CBC Latest Ref Rng & Units 02/12/2016 01/29/2016 01/25/2016  WBC 3.9 - 10.3 10e3/uL 7.3 9.4 8.7  Hemoglobin 11.6 - 15.9 g/dL 12.7 12.6 13.8  Hematocrit 34.8 - 46.6 % 39.5 39.7 42.7  Platelets 145 - 400 10e3/uL 261 178 211   CMP Latest Ref Rng & Units 02/12/2016 01/29/2016 01/25/2016  Glucose 70 - 140 mg/dl 115 135(H) 100(H)  BUN 7.0 - 26.0 mg/dL 13.5 <5(L) 14  Creatinine 0.6 - 1.1 mg/dL 0.8 0.62 0.60  Sodium 136 - 145 mEq/L 142 140 140  Potassium 3.5 - 5.1 mEq/L 4.1 3.6 4.5  Chloride 101 - 111 mmol/L - 106 108  CO2 22 - 29 mEq/L 25 27 24   Calcium 8.4 - 10.4 mg/dL 9.5 8.5(L) 9.7  Total Protein 6.4 - 8.3 g/dL 6.6 - -  Total Bilirubin 0.20 - 1.20 mg/dL 0.38 - -  Alkaline Phos 40 - 150 U/L 270(H) - -  AST 5 - 34 U/L 16 - -  ALT 0 - 55 U/L 30 - -    PATHOLOGY REPORT  Diagnosis 01/04/2016 Colon, biopsy, rectosigmoid junction - ADENOCARCINOMA, SEE COMMENT. Microscopic Comment Dr. Saralyn Pilar reviewed this case and concurs. The results were discussed with Dr. Watt Climes on 01/05/16. (BNS:gt, 01/05/16)  Diagnosis 01/28/2016 1. Soft tissue, biopsy, Implant on sigmoid colon - BENIGN CALCIFIED NODULE. - NO EVIDENCE OF MALIGNANCY. 2. Colon, segmental resection for tumor, Recto-sigmoid - INVASIVE COLONIC ADENOCARCINOMA, 4.5 CM EXTENDING INTO PERICOLONIC CONNECTIVE TISSUE. - LYMPHATIC VASCULAR INVOLVEMENT BY TUMOR. - MARGINS NOT INVOLVED. - METASTATIC CARCINOMA IN 9 OF 21 LYMPH NODES (9/21). - SEE ONCOLOGY TABLE BELOW. 3. Colon, resection margin (donut), Proximal and distal of sigmoid - BENIGN PROXIMAL AND DISTAL ANASTOMATIC RINGS. - NO EVIDENCE OF MALIGNANCY. Microscopic Comment 2. COLON AND RECTUM (INCLUDING TRANS-ANAL RESECTION): Specimen: Sigmoid  colon. Procedure: Segmental resection. Tumor site: Rectosigmoid. Specimen integrity: Intact. Macroscopic intactness of mesorectum: Near complete. Macroscopic tumor perforation: No. Invasive tumor: Maximum size: 4.5 cm. Histologic type(s): Colorectal adenocarcinoma. Histologic grade and differentiation: G2: moderately differentiated/low grade. Type of polyp in which invasive carcinoma arose: No residual polyp. Microscopic extension of invasive tumor: Focally into pericolonic connective tissue. Lymph-Vascular invasion: Present. Peri-neural invasion: No. Tumor deposit(s) (discontinuous extramural extension): No. Resection margins: Proximal margin: Free of tumor. Distal margin: Free of tumor. Circumferential (radial) (posterior ascending, posterior descending; lateral and posterior mid-rectum; and entire  lower 1/3 rectum): Free of tumor. Mesenteric margin (sigmoid and transverse): Free of tumor. Distance closest margin (if all above margins negative): 1.5 cm from distal margin. Treatment effect (neo-adjuvant therapy): No. Additional polyp(s): No. Non-neoplastic findings: Benign diverticulum. Lymph nodes: number examined 21; number positive: 9. Pathologic Staging: pT3, pN2b, pMX. Ancillary studies: Microsatellite instability by PCR and mismatch repair protein by immunohistochemistry pending. (JDP:ds 02/01/16) Claudette Laws MD Pathologist, Electronic Signature (Case signed 02/01/2016) Specimen Gross and Clinical Information  RADIOGRAPHIC STUDIES: I have personally reviewed the radiological images as listed and agreed with the findings in the report. Dg Or Local Abdomen  Result Date: 01/28/2016 CLINICAL DATA:  66 year old female with incorrect instrument count in the operating room. Colon cancer, laparoscopic assisted partial colectomy with primary anastomosis. Initial encounter. EXAM: OR LOCAL ABDOMEN COMPARISON:  CT Abdomen and Pelvis 01/05/2016 FINDINGS: Portable AP supine view at  1224 hours. Midline skin staples from the lower lumbar spine to just above the pubic symphysis. Internal GI staples project in the rectosigmoid region. There are several other scattered skin type staples seen including a similar-sized linear metallic object projecting over the left SI joint. No retained surgical instrument or other radiopaque foreign body identified. Visible bowel gas pattern within normal limits. No acute osseous abnormality identified. IMPRESSION: Expected postoperative changes. No retained surgical instrument or radiopaque foreign body identified. This was discussed by telephone with the OR on 01/28/2016 at 12:43 . Electronically Signed   By: Genevie Ann M.D.   On: 01/28/2016 12:43   CT abdomen and pelvis with contrast 01/05/2016 IMPRESSION: 1. No dominant sigmoid colon mass identified. There is underdistention and soft tissue fullness at the rectosigmoid junction, suspicious for the primary lesion. Borderline enlarged nodes in the sigmoid mesocolon and at the origin of the inferior mesenteric artery are indeterminate. As these are in the drainage pathway for a sigmoid primary, localized nodal metastasis cannot be excluded. 2. No evidence of distant metastatic disease. 3.  Aortic atherosclerosis. 4. Uterine fibroid. 5. Left hepatic lobe giant hemangioma, as before.  Colonoscopy 01/04/2016 Dr. Watt Climes Findings: An ulcerated partially obstructing large mass was found in the rectosigmoid colon. The mass was partially circumferential, measured about 5 cm in length. Biopsy was taken. Multiple small milestone diverticula were found in the sigmoid colon. The exam otherwise was normal.   ASSESSMENT & PLAN:  65 year old Caucasian female, with recently diagnosed colorectal cancer   1. Colorectal cancer, invasive adenocarcinoma,pT3N2bM0, stage IIIC, MSI-stable  -Her primary cancer is located in the rectosigmoid colon. -I reviewed her CT abdomen and pelvis, colonoscopy, and her surgical  pathology findings in great details with patient and her husband. -She has had complete surgical resection -I'll obtain a CT chest without contrast to complete her staging -Giving the multiple positive lymph nodes, and locally advanced disease, she is at high risk for recurrence. -I recommend adjuvant chemotherapy to reduce her risk of recurrence, treated options of FOLFOX and CAPOX were discussed with pt and her husband -Based on the recently reported data, 3 months oxaliplatin-based adjuvant chemotherapy has similar outcome as 6 months imminent. However due to her multiple positive lymph nodes, I will recommend 6 months therapy if she can tolerate. If she does have significant side effects, I'll back off her chemotherapy or even stop after 3 months of therapy.  --Chemotherapy consent: Side effects including but does not not limited to, fatigue, nausea, vomiting, diarrhea, hair loss, neuropathy, fluid retention, renal and kidney dysfunction, neutropenic fever, needed for blood transfusion, bleeding, coronary artery spasm and  heart attack, were discussed with patient in great detail. She agrees to proceed. -she agrees with port placement -She will think about which regimen she would like to take.  -I plan to start her treatment in about 3 weeks, she has recovered relatively well from her surgery. -I discussed the risk of cancer recurrence in the future. I discussed the surveillance plan, which is a physical exam and lab test (including CBC, CMP and CEA) every 3 months for the first 2 years, then every 6-12 months, colonoscopy in one year, and surveilliance CT scan every 6-12 month for up to 5 year.   2. 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 This is considered a benign breast lesion, not high risk for breast cancer. -I used the [employer] Janus model to calculate her risk of developing breast cancer, Based  on her age, family history, pregnancy history, etc, her risk of developing breast cancer is 3.3% in 5 years and 11.7% in her lifetime. This is consider moderate risk. -We reviewed the risk factors for developing breast cancer, and strategies to prevent breast cancer, especially healthy diet, exercise, and maintain ideal weight.  -According to the at FDA indications, based on her risk of breast cancer estimated from [place], she is qualified for chemoprevention with tamoxifen or anastrozole. Potential benefit and risks (side effects) were discussed with her in great details. I given her the written material of tamoxifen and anastrozole for her to review. Given her overall moderate risk, I do not strongly feel she needs to take chemoprevention. She agrees with it. -We finally discussed the breast cancer screening, including yearly screening mammogram with 3-D technology, self-exam every months and physician exam breast exam once a year.    Plan -CT chest without contrast in the next few weeks  -Port placement by Dr. Marlou Starks -She will call me next week and let me know if she wants FOlfox or Capeox -Chemotherapy class in follow-up in 2 weeks, I plan to start her on chemotherapy in 3 weeks    All questions were answered. The patient knows to call the clinic with any problems, questions or concerns.  I spent 40 minutes counseling the patient face to face. The total time spent in the appointment was 50 minutes and more than 50% was on counseling.     Truitt Merle, MD 02/12/2016 5:14 PM

## 2016-02-15 ENCOUNTER — Ambulatory Visit: Payer: Self-pay | Admitting: General Surgery

## 2016-02-16 ENCOUNTER — Encounter (HOSPITAL_BASED_OUTPATIENT_CLINIC_OR_DEPARTMENT_OTHER): Payer: Self-pay | Admitting: *Deleted

## 2016-02-18 ENCOUNTER — Encounter (HOSPITAL_BASED_OUTPATIENT_CLINIC_OR_DEPARTMENT_OTHER): Payer: Self-pay | Admitting: Anesthesiology

## 2016-02-18 ENCOUNTER — Ambulatory Visit (HOSPITAL_BASED_OUTPATIENT_CLINIC_OR_DEPARTMENT_OTHER): Payer: Federal, State, Local not specified - PPO | Admitting: Anesthesiology

## 2016-02-18 ENCOUNTER — Encounter (HOSPITAL_BASED_OUTPATIENT_CLINIC_OR_DEPARTMENT_OTHER)
Admission: RE | Disposition: A | Discharge status: Home / Self Care | Payer: Self-pay | Source: Ambulatory Visit | Attending: General Surgery

## 2016-02-18 ENCOUNTER — Ambulatory Visit (HOSPITAL_BASED_OUTPATIENT_CLINIC_OR_DEPARTMENT_OTHER)
Admission: RE | Admit: 2016-02-18 | Discharge: 2016-02-18 | Disposition: A | Discharge status: Home / Self Care | Payer: Federal, State, Local not specified - PPO | Source: Ambulatory Visit | Attending: General Surgery | Admitting: General Surgery

## 2016-02-18 ENCOUNTER — Ambulatory Visit (HOSPITAL_COMMUNITY): Payer: Federal, State, Local not specified - PPO

## 2016-02-18 DIAGNOSIS — M1990 Unspecified osteoarthritis, unspecified site: Secondary | ICD-10-CM | POA: Insufficient documentation

## 2016-02-18 DIAGNOSIS — C19 Malignant neoplasm of rectosigmoid junction: Secondary | ICD-10-CM | POA: Diagnosis not present

## 2016-02-18 DIAGNOSIS — N6022 Fibroadenosis of left breast: Secondary | ICD-10-CM | POA: Diagnosis not present

## 2016-02-18 DIAGNOSIS — Z95828 Presence of other vascular implants and grafts: Secondary | ICD-10-CM

## 2016-02-18 DIAGNOSIS — K219 Gastro-esophageal reflux disease without esophagitis: Secondary | ICD-10-CM | POA: Diagnosis not present

## 2016-02-18 DIAGNOSIS — C20 Malignant neoplasm of rectum: Secondary | ICD-10-CM | POA: Diagnosis not present

## 2016-02-18 DIAGNOSIS — C187 Malignant neoplasm of sigmoid colon: Secondary | ICD-10-CM | POA: Diagnosis not present

## 2016-02-18 DIAGNOSIS — Z419 Encounter for procedure for purposes other than remedying health state, unspecified: Secondary | ICD-10-CM

## 2016-02-18 HISTORY — PX: PORTACATH PLACEMENT: SHX2246

## 2016-02-18 SURGERY — INSERTION, TUNNELED CENTRAL VENOUS DEVICE, WITH PORT
Anesthesia: General | Site: Chest

## 2016-02-18 MED ORDER — GLYCOPYRROLATE 0.2 MG/ML IJ SOLN
0.2000 mg | Freq: Once | INTRAMUSCULAR | Status: AC | PRN
  Administered 2016-02-18: 0.2 mg via INTRAVENOUS

## 2016-02-18 MED ORDER — DEXAMETHASONE SODIUM PHOSPHATE 4 MG/ML IJ SOLN
INTRAMUSCULAR | Status: DC | PRN
  Administered 2016-02-18: 10 mg via INTRAVENOUS

## 2016-02-18 MED ORDER — BUPIVACAINE HCL (PF) 0.25 % IJ SOLN
INTRAMUSCULAR | Status: DC | PRN
  Administered 2016-02-18: 9 mL

## 2016-02-18 MED ORDER — DEXAMETHASONE SODIUM PHOSPHATE 10 MG/ML IJ SOLN
INTRAMUSCULAR | Status: AC
  Filled 2016-02-18: qty 1

## 2016-02-18 MED ORDER — MIDAZOLAM HCL 2 MG/2ML IJ SOLN
INTRAMUSCULAR | Status: AC
  Filled 2016-02-18: qty 2

## 2016-02-18 MED ORDER — HEPARIN SOD (PORK) LOCK FLUSH 100 UNIT/ML IV SOLN
INTRAVENOUS | Status: DC | PRN
  Administered 2016-02-18: 500 [IU] via INTRAVENOUS

## 2016-02-18 MED ORDER — ONDANSETRON HCL 4 MG/2ML IJ SOLN
INTRAMUSCULAR | Status: DC | PRN
  Administered 2016-02-18: 4 mg via INTRAVENOUS

## 2016-02-18 MED ORDER — ONDANSETRON HCL 4 MG/2ML IJ SOLN
INTRAMUSCULAR | Status: AC
  Filled 2016-02-18: qty 2

## 2016-02-18 MED ORDER — CHLORHEXIDINE GLUCONATE CLOTH 2 % EX PADS: 6.0000 | MEDICATED_PAD | Freq: Once | CUTANEOUS | Status: DC

## 2016-02-18 MED ORDER — CEFAZOLIN SODIUM-DEXTROSE 2-4 GM/100ML-% IV SOLN
2.0000 g | INTRAVENOUS | Status: AC
  Administered 2016-02-18: 2 g via INTRAVENOUS

## 2016-02-18 MED ORDER — PROPOFOL 10 MG/ML IV BOLUS
INTRAVENOUS | Status: AC
  Filled 2016-02-18: qty 20

## 2016-02-18 MED ORDER — IOPAMIDOL (ISOVUE-300) INJECTION 61%
INTRAVENOUS | Status: AC
  Filled 2016-02-18: qty 50

## 2016-02-18 MED ORDER — LIDOCAINE HCL (CARDIAC) 20 MG/ML IV SOLN
INTRAVENOUS | Status: DC | PRN
  Administered 2016-02-18: 30 mg via INTRAVENOUS

## 2016-02-18 MED ORDER — SCOPOLAMINE 1 MG/3DAYS TD PT72: 1.0000 | MEDICATED_PATCH | Freq: Once | TRANSDERMAL | Status: DC | PRN

## 2016-02-18 MED ORDER — CEFAZOLIN SODIUM-DEXTROSE 2-4 GM/100ML-% IV SOLN
INTRAVENOUS | Status: AC
  Filled 2016-02-18: qty 100

## 2016-02-18 MED ORDER — HYDROCODONE-ACETAMINOPHEN 5-325 MG PO TABS: 1.0000 | ORAL_TABLET | ORAL | 0 refills | Status: DC | PRN

## 2016-02-18 MED ORDER — LIDOCAINE 2% (20 MG/ML) 5 ML SYRINGE
INTRAMUSCULAR | Status: AC
  Filled 2016-02-18: qty 5

## 2016-02-18 MED ORDER — PROMETHAZINE HCL 25 MG/ML IJ SOLN: 6.2500 mg | INTRAMUSCULAR | Status: DC | PRN

## 2016-02-18 MED ORDER — HYDROMORPHONE HCL 1 MG/ML IJ SOLN: 0.2500 mg | INTRAMUSCULAR | Status: DC | PRN

## 2016-02-18 MED ORDER — LACTATED RINGERS IV SOLN
INTRAVENOUS | Status: DC
  Administered 2016-02-18: 10 mL/h via INTRAVENOUS

## 2016-02-18 MED ORDER — HEPARIN (PORCINE) IN NACL 2-0.9 UNIT/ML-% IJ SOLN
INTRAMUSCULAR | Status: DC | PRN
Start: 2016-02-18 — End: 2016-02-18
  Administered 2016-02-18: 20 mL via INTRAVENOUS

## 2016-02-18 MED ORDER — MIDAZOLAM HCL 2 MG/2ML IJ SOLN: 1.0000 mg | INTRAMUSCULAR | Status: DC | PRN

## 2016-02-18 MED ORDER — GLYCOPYRROLATE 0.2 MG/ML IV SOSY
PREFILLED_SYRINGE | INTRAVENOUS | Status: AC
  Filled 2016-02-18: qty 3

## 2016-02-18 MED ORDER — PHENYLEPHRINE 40 MCG/ML (10ML) SYRINGE FOR IV PUSH (FOR BLOOD PRESSURE SUPPORT)
PREFILLED_SYRINGE | INTRAVENOUS | Status: AC
  Filled 2016-02-18: qty 10

## 2016-02-18 MED ORDER — PROPOFOL 10 MG/ML IV BOLUS
INTRAVENOUS | Status: DC | PRN
  Administered 2016-02-18: 100 mg via INTRAVENOUS

## 2016-02-18 MED ORDER — MIDAZOLAM HCL 5 MG/5ML IJ SOLN
INTRAMUSCULAR | Status: DC | PRN
Start: 2016-02-18 — End: 2016-02-18
  Administered 2016-02-18: 2 mg via INTRAVENOUS

## 2016-02-18 MED ORDER — PHENYLEPHRINE HCL 10 MG/ML IJ SOLN
INTRAMUSCULAR | Status: DC | PRN
  Administered 2016-02-18: 80 ug via INTRAVENOUS

## 2016-02-18 MED ORDER — FENTANYL CITRATE (PF) 100 MCG/2ML IJ SOLN
INTRAMUSCULAR | Status: AC
  Filled 2016-02-18: qty 2

## 2016-02-18 MED ORDER — FENTANYL CITRATE (PF) 100 MCG/2ML IJ SOLN
INTRAMUSCULAR | Status: DC | PRN
  Administered 2016-02-18: 100 ug via INTRAVENOUS

## 2016-02-18 MED ORDER — FENTANYL CITRATE (PF) 100 MCG/2ML IJ SOLN: 50.0000 ug | INTRAMUSCULAR | Status: DC | PRN

## 2016-02-18 SURGICAL SUPPLY — 46 items
ADH SKN CLS APL DERMABOND .7 (GAUZE/BANDAGES/DRESSINGS) ×1
BAG DECANTER FOR FLEXI CONT (MISCELLANEOUS) ×2 IMPLANT
BLADE SURG 15 STRL LF DISP TIS (BLADE) ×1 IMPLANT
BLADE SURG 15 STRL SS (BLADE) ×2
CANISTER SUCT 1200ML W/VALVE (MISCELLANEOUS) IMPLANT
CHLORAPREP W/TINT 26ML (MISCELLANEOUS) ×2 IMPLANT
CLEANER CAUTERY TIP 5X5 PAD (MISCELLANEOUS) ×1 IMPLANT
COVER BACK TABLE 60X90IN (DRAPES) ×2 IMPLANT
COVER MAYO STAND STRL (DRAPES) ×2 IMPLANT
DECANTER SPIKE VIAL GLASS SM (MISCELLANEOUS) IMPLANT
DERMABOND ADVANCED (GAUZE/BANDAGES/DRESSINGS) ×1
DERMABOND ADVANCED .7 DNX12 (GAUZE/BANDAGES/DRESSINGS) ×1 IMPLANT
DRAPE C-ARM 42X72 X-RAY (DRAPES) ×2 IMPLANT
DRAPE LAPAROSCOPIC ABDOMINAL (DRAPES) ×2 IMPLANT
DRAPE UTILITY XL STRL (DRAPES) ×2 IMPLANT
ELECT REM PT RETURN 9FT ADLT (ELECTROSURGICAL) ×2
ELECTRODE REM PT RTRN 9FT ADLT (ELECTROSURGICAL) ×1 IMPLANT
GLOVE BIO SURGEON STRL SZ7.5 (GLOVE) ×2 IMPLANT
GLOVE SURG SS PI 7.0 STRL IVOR (GLOVE) ×1 IMPLANT
GOWN STRL REUS W/ TWL LRG LVL3 (GOWN DISPOSABLE) ×2 IMPLANT
GOWN STRL REUS W/TWL LRG LVL3 (GOWN DISPOSABLE) ×4
IV KIT MINILOC 20X1 SAFETY (NEEDLE) IMPLANT
KIT PORT POWER 8FR ISP CVUE (Catheter) ×1 IMPLANT
NDL SAFETY ECLIPSE 18X1.5 (NEEDLE) IMPLANT
NDL SPNL 22GX3.5 QUINCKE BK (NEEDLE) IMPLANT
NEEDLE HYPO 18GX1.5 SHARP (NEEDLE)
NEEDLE HYPO 22GX1.5 SAFETY (NEEDLE) ×1 IMPLANT
NEEDLE HYPO 25X1 1.5 SAFETY (NEEDLE) ×2 IMPLANT
NEEDLE SPNL 22GX3.5 QUINCKE BK (NEEDLE) IMPLANT
PACK BASIN DAY SURGERY FS (CUSTOM PROCEDURE TRAY) ×2 IMPLANT
PAD CLEANER CAUTERY TIP 5X5 (MISCELLANEOUS) ×1
PENCIL BUTTON HOLSTER BLD 10FT (ELECTRODE) ×2 IMPLANT
SLEEVE SCD COMPRESS KNEE MED (MISCELLANEOUS) ×1 IMPLANT
SUT MON AB 4-0 PC3 18 (SUTURE) ×2 IMPLANT
SUT PROLENE 2 0 SH DA (SUTURE) ×2 IMPLANT
SUT SILK 2 0 TIES 17X18 (SUTURE)
SUT SILK 2-0 18XBRD TIE BLK (SUTURE) IMPLANT
SUT VIC AB 3-0 SH 27 (SUTURE) ×2
SUT VIC AB 3-0 SH 27X BRD (SUTURE) ×1 IMPLANT
SYR 5ML LL (SYRINGE) ×2 IMPLANT
SYR CONTROL 10ML LL (SYRINGE) ×3 IMPLANT
SYRINGE 10CC LL (SYRINGE) IMPLANT
TOWEL OR 17X24 6PK STRL BLUE (TOWEL DISPOSABLE) ×3 IMPLANT
TOWEL OR NON WOVEN STRL DISP B (DISPOSABLE) ×2 IMPLANT
TUBE CONNECTING 20X1/4 (TUBING) IMPLANT
YANKAUER SUCT BULB TIP NO VENT (SUCTIONS) IMPLANT

## 2016-02-18 NOTE — Op Note (Signed)
02/18/2016  3:04 PM  PATIENT:  Bethany Frederick  65 y.o. female  PRE-OPERATIVE DIAGNOSIS:  RECTAL CANCER  POST-OPERATIVE DIAGNOSIS:  RECTAL CANCER  PROCEDURE:  Procedure(s): INSERTION PORT-A-CATH (N/A)  SURGEON:  Surgeon(s) and Role:    * Jovita Kussmaul, MD - Primary  PHYSICIAN ASSISTANT:   ASSISTANTS: none   ANESTHESIA:   local and general  EBL:  Total I/O In: 500 [I.V.:500] Out: 10 [Blood:10]  BLOOD ADMINISTERED:none  DRAINS: none   LOCAL MEDICATIONS USED:  MARCAINE     SPECIMEN:  No Specimen  DISPOSITION OF SPECIMEN:  N/A  COUNTS:  YES  TOURNIQUET:  * No tourniquets in log *  DICTATION: .Dragon Dictation   After informed consent was obtained the patient was brought to the operating room and placed in the supine position on the operating room table. A roll was placed between the patient's shoulder blades to extend the shoulder slightly. After adequate induction of general anesthesia the patient's chest and breast and neck area were prepped with ChloraPrep, allowed to dry, and draped in usual sterile manner. An appropriate timeout was performed. The patient was then placed in Trendelenburg position. The area of the left chest lateral to the bend of the clavicle was infiltrated with quarter percent Marcaine. A large bore needle from the Port-A-Cath kit was used to slide beneath the bend of the clavicle on the left chest heading towards the sternal notch and in doing so I was able to access the left subclavian vein without difficulty. A wire was fed through the needle using the Seldinger technique without difficulty. The wire was confirmed in the central venous system using real-time fluoroscopy. Next a small incision was made on the left chest at the wire entry site. The incision was carried through the skin and subcutaneous tissue sharply with electrocautery. A small subcutaneous pocket was created inferior to the incision by blunt finger dissection and some sharp dissection  with electrocautery. Next the tubing was placed on the reservoir. The reservoir was placed in the pocket and the length of the tubing was estimated using real-time fluoroscopy. The tubing was cut to the appropriate length. Next the sheath and dilator were fed over the wire also using the Seldinger technique without difficulty. The dilator and wire were removed. The tubing was fed through the sheath as far as it would go and then held in place while the sheath was gently cracked and separated. Another real-time fluoroscopy image showed the tip of the catheter to be in the distal superior vena cava. The tubing was then permanently anchored to the reservoir. The reservoir was anchored in the pocket with 2 2-0 Prolene stitches. The port was then aspirated and it aspirated blood easily. The port was then flushed initially with a dilute heparin solution and then with the more concentrated heparin solution. The subcutaneous tissue was closed over the port with interrupted 3-0 Vicryl stitches. The skin was closed with a running 4-0 Monocryl subcuticular stitch. Dermabond dressings were applied. The patient tolerated the procedure well. At the end of the case all needle sponge and instrument counts were correct. The patient was then awakened and taken to recovery in stable condition.  PLAN OF CARE: Discharge to home after PACU  PATIENT DISPOSITION:  PACU - hemodynamically stable.   Delay start of Pharmacological VTE agent (>24hrs) due to surgical blood loss or risk of bleeding: not applicable

## 2016-02-18 NOTE — Transfer of Care (Signed)
Immediate Anesthesia Transfer of Care Note  Patient: Bethany Frederick  Procedure(s) Performed: Procedure(s): INSERTION PORT-A-CATH (N/A)  Patient Location: PACU  Anesthesia Type:General  Level of Consciousness: awake and patient cooperative  Airway & Oxygen Therapy: Patient Spontanous Breathing and Patient connected to face mask oxygen  Post-op Assessment: Report given to RN and Post -op Vital signs reviewed and stable  Post vital signs: Reviewed and stable  Last Vitals:  Vitals:   02/18/16 1231  BP: (!) 156/75  Pulse: (!) 52  Resp: 18  Temp: 37.6 C    Last Pain:  Vitals:   02/18/16 1231  TempSrc: Oral         Complications: No apparent anesthesia complications

## 2016-02-18 NOTE — Interval H&P Note (Signed)
History and Physical Interval Note:  02/18/2016 1:59 PM  Hurshel Party  has presented today for surgery, with the diagnosis of RECTAL CANCER  The various methods of treatment have been discussed with the patient and family. After consideration of risks, benefits and other options for treatment, the patient has consented to  Procedure(s): INSERTION PORT-A-CATH (N/A) as a surgical intervention .  The patient's history has been reviewed, patient examined, no change in status, stable for surgery.  I have reviewed the patient's chart and labs.  Questions were answered to the patient's satisfaction.     TOTH III,PAUL S

## 2016-02-18 NOTE — Anesthesia Preprocedure Evaluation (Addendum)
Anesthesia Evaluation  Patient identified by MRN, date of birth, ID band Patient awake    Reviewed: Allergy & Precautions, NPO status , Patient's Chart, lab work & pertinent test results  Airway Mallampati: I  TM Distance: >3 FB Neck ROM: Full    Dental  (+) Teeth Intact, Dental Advisory Given   Pulmonary neg pulmonary ROS,    breath sounds clear to auscultation       Cardiovascular negative cardio ROS   Rhythm:Regular Rate:Normal     Neuro/Psych    GI/Hepatic Neg liver ROS, GERD  ,  Endo/Other  negative endocrine ROS  Renal/GU negative Renal ROS     Musculoskeletal  (+) Arthritis ,   Abdominal   Peds  Hematology   Anesthesia Other Findings   Reproductive/Obstetrics                            Anesthesia Physical Anesthesia Plan  ASA: III  Anesthesia Plan: General   Post-op Pain Management:    Induction: Intravenous  Airway Management Planned: LMA  Additional Equipment:   Intra-op Plan:   Post-operative Plan: Extubation in OR  Informed Consent: I have reviewed the patients History and Physical, chart, labs and discussed the procedure including the risks, benefits and alternatives for the proposed anesthesia with the patient or authorized representative who has indicated his/her understanding and acceptance.   Dental advisory given  Plan Discussed with: CRNA and Anesthesiologist  Anesthesia Plan Comments:         Anesthesia Quick Evaluation

## 2016-02-18 NOTE — H&P (Signed)
Bethany Frederick  Location: [facility] Patient #: [id number] DOB: 1950-11-16 Married / Language: English / Race: White Female   History of Present Illness  Patient words: colon cancer.  The patient is a 65 year old female who presents with a complaint of Bloody stools. We are asked to see the patient in consultation by Dr. Watt Climes to evaluate her for a new rectosigmoid colon cancer. The patient is a 65 year old white female who initially went to her regular medical doc for a checkup. At that time she was found to have an elevated liver function. She also noted that she has had some blood with her bm's over the last few months. She denies any abdominal pain. She denies any constipation or diarrhea. She has been maintaining her weight well. She then had a colonoscopy which showed a mass near the rectosigmoid junction. 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 She also had a CT of the abdomen and pelvis which does not show distant disease. Her CEA was normal.   Allergies No Known Drug Allergies  Medication History  Advil (100MG  Tablet Chewable, Oral) Active. Tylenol (325MG  Tablet, Oral) Active. Medications Reconciled    Review of Systems General Not Present- Appetite Loss, Chills, Fatigue, Fever, Night Sweats, Weight Gain and Weight Loss. Skin Not Present- Change in Wart/Mole, Dryness, Hives, Jaundice, New Lesions, Non-Healing Wounds, Rash and Ulcer. HEENT Not Present- Earache, Hearing Loss, Hoarseness, Nose Bleed, Oral Ulcers, Ringing in the Ears, Seasonal Allergies, Sinus Pain, Sore Throat, Visual Disturbances, Wears glasses/contact lenses and Yellow Eyes. Respiratory Not Present- Bloody sputum, Chronic Cough, Difficulty Breathing, Snoring and Wheezing. Breast Not Present- Breast Mass, Breast Pain, Nipple Discharge and Skin Changes. Cardiovascular Not Present- Chest Pain, Difficulty Breathing Lying Down, Leg Cramps, Palpitations, Rapid Heart Rate, Shortness of  Breath and Swelling of Extremities. Gastrointestinal Present- Bloody Stool. Not Present- Abdominal Pain, Bloating, Change in Bowel Habits, Chronic diarrhea, Constipation, Difficulty Swallowing, Excessive gas, Gets full quickly at meals, Hemorrhoids, Indigestion, Nausea, Rectal Pain and Vomiting. Female Genitourinary Not Present- Frequency, Nocturia, Painful Urination, Pelvic Pain and Urgency. Musculoskeletal Not Present- Back Pain, Joint Pain, Joint Stiffness, Muscle Pain, Muscle Weakness and Swelling of Extremities. Neurological Not Present- Decreased Memory, Fainting, Headaches, Numbness, Seizures, Tingling, Tremor, Trouble walking and Weakness. Psychiatric Not Present- Anxiety, Bipolar, Change in Sleep Pattern, Depression, Fearful and Frequent crying. Endocrine Not Present- Cold Intolerance, Excessive Hunger, Hair Changes, Heat Intolerance, Hot flashes and New Diabetes. Hematology Not Present- Easy Bruising, Excessive bleeding, Gland problems, HIV and Persistent Infections.  Vitals Weight: 192.4 lb Height: 66in Body Surface Area: 1.97 m Body Mass Index: 31.05 kg/m  Temp.: 98.27F(Oral)  Pulse: 66 (Regular)  BP: 122/76 (Sitting, Left Arm, Standard)       Physical Exam  General Mental Status-Alert. General Appearance-Consistent with stated age. Hydration-Well hydrated. Voice-Normal.  Head and Neck Head-normocephalic, atraumatic with no lesions or palpable masses. Trachea-midline. Thyroid Gland Characteristics - normal size and consistency.  Eye Eyeball - Bilateral-Extraocular movements intact. Sclera/Conjunctiva - Bilateral-No scleral icterus.  Chest and Lung Exam Chest and lung exam reveals -quiet, even and easy respiratory effort with no use of accessory muscles and on auscultation, normal breath sounds, no adventitious sounds and normal vocal resonance. Inspection Chest Wall - Normal. Back - normal.  Cardiovascular Cardiovascular  examination reveals -normal heart sounds, regular rate and rhythm with no murmurs and normal pedal pulses bilaterally.  Abdomen Inspection Inspection of the abdomen reveals - No Hernias. Skin - Scar - no surgical  scars. Palpation/Percussion Palpation and Percussion of the abdomen reveal - Soft, Non Tender, No Rebound tenderness, No Rigidity (guarding) and No hepatosplenomegaly. Auscultation Auscultation of the abdomen reveals - Bowel sounds normal.  Rectal Note: There is normal rectal tone. On digital exam, I believe I can just barely feel the edge of the tumor with the tip of my finger   Neurologic Neurologic evaluation reveals -alert and oriented x 3 with no impairment of recent or remote memory. Mental Status-Normal.  Musculoskeletal Normal Exam - Left-Upper Extremity Strength Normal and Lower Extremity Strength Normal. Normal Exam - Right-Upper Extremity Strength Normal and Lower Extremity Strength Normal.  Lymphatic Head & Neck  General Head & Neck Lymphatics: Bilateral - Description - Normal. Axillary  General Axillary Region: Bilateral - Description - Normal. Tenderness - Non Tender. Femoral & Inguinal  Generalized Femoral & Inguinal Lymphatics: Bilateral - Description - Normal. Tenderness - Non Tender. 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 Her preoperative evaluation has shown no evidence of distant disease. Because of the risk of obstruction and progression of the cancer would recommend that she have this area of her colon removed. She will be a good candidate for a laparoscopic-assisted partial colectomy with primary anastomosis. I have discussed with her in detail the risks and benefits of the operation as well as some of the technical aspects and she understands and wishes to  proceed. Current Plans Pt Education - Colorectal Cancer: discussed with patient and provided information. Started Neomycin Sulfate 500MG , 2 (two) Tablet SEE NOTE, #6, 01/14/2016, No Refill. Local Order: TAKE TWO TABLETS AT 2 PM, 3 PM, AND 10 PM THE DAY PRIOR TO SURGERY Started Flagyl 500MG , 2 (two) Tablet SEE NOTE, #6, 01/14/2016, No Refill. Local Order: Take at 2pm, 3pm, and 10pm the day prior to your colon operation  She has had low anterior resection for her rectal cancer. She will need chemotherapy as part of her treatment. She is here for port placement. I have discussed with her the risks and benefits of port placement as well as some of the technical aspects and she understands and wishes to proceed.

## 2016-02-18 NOTE — Discharge Instructions (Signed)

## 2016-02-18 NOTE — Anesthesia Procedure Notes (Signed)
Procedure Name: LMA Insertion Date/Time: 02/18/2016 2:24 PM Performed by: Toula Moos L Pre-anesthesia Checklist: Patient identified, Emergency Drugs available, Suction available, Patient being monitored and Timeout performed Patient Re-evaluated:Patient Re-evaluated prior to inductionOxygen Delivery Method: Circle system utilized Preoxygenation: Pre-oxygenation with 100% oxygen Intubation Type: IV induction Ventilation: Mask ventilation without difficulty LMA: LMA inserted LMA Size: 4.0 Number of attempts: 1 Airway Equipment and Method: Bite block Placement Confirmation: positive ETCO2 Tube secured with: Tape Dental Injury: Teeth and Oropharynx as per pre-operative assessment

## 2016-02-19 ENCOUNTER — Encounter (HOSPITAL_BASED_OUTPATIENT_CLINIC_OR_DEPARTMENT_OTHER): Payer: Self-pay | Admitting: General Surgery

## 2016-02-19 NOTE — Anesthesia Postprocedure Evaluation (Signed)
Anesthesia Post Note  Patient: Bethany Frederick  Procedure(s) Performed: Procedure(s) (LRB): INSERTION PORT-A-CATH (N/A)  Patient location during evaluation: PACU Anesthesia Type: General Level of consciousness: awake and alert Pain management: pain level controlled Vital Signs Assessment: post-procedure vital signs reviewed and stable Respiratory status: spontaneous breathing, nonlabored ventilation and respiratory function stable Cardiovascular status: blood pressure returned to baseline and stable Postop Assessment: no signs of nausea or vomiting Anesthetic complications: no    Last Vitals:  Vitals:   02/18/16 1545 02/18/16 1610  BP: (!) 143/88 (!) 158/75  Pulse: 88 81  Resp: 20 16  Temp:  36.6 C    Last Pain:  Vitals:   02/18/16 1610  TempSrc:   PainSc: 0-No pain                 Stormee Duda A

## 2016-02-24 ENCOUNTER — Ambulatory Visit (HOSPITAL_COMMUNITY)
Admission: RE | Admit: 2016-02-24 | Discharge: 2016-02-24 | Disposition: A | Discharge status: Home / Self Care | Payer: Federal, State, Local not specified - PPO | Source: Ambulatory Visit | Attending: Hematology | Admitting: Hematology

## 2016-02-24 ENCOUNTER — Encounter (HOSPITAL_COMMUNITY): Payer: Self-pay

## 2016-02-24 ENCOUNTER — Telehealth: Payer: Self-pay | Admitting: *Deleted

## 2016-02-24 ENCOUNTER — Encounter: Payer: Self-pay | Admitting: *Deleted

## 2016-02-24 ENCOUNTER — Other Ambulatory Visit: Payer: Self-pay | Admitting: *Deleted

## 2016-02-24 ENCOUNTER — Other Ambulatory Visit (HOSPITAL_BASED_OUTPATIENT_CLINIC_OR_DEPARTMENT_OTHER): Payer: Federal, State, Local not specified - PPO

## 2016-02-24 DIAGNOSIS — C20 Malignant neoplasm of rectum: Secondary | ICD-10-CM | POA: Diagnosis not present

## 2016-02-24 DIAGNOSIS — C19 Malignant neoplasm of rectosigmoid junction: Secondary | ICD-10-CM | POA: Diagnosis not present

## 2016-02-24 DIAGNOSIS — D1803 Hemangioma of intra-abdominal structures: Secondary | ICD-10-CM | POA: Insufficient documentation

## 2016-02-25 ENCOUNTER — Telehealth: Payer: Self-pay | Admitting: *Deleted

## 2016-02-25 NOTE — Telephone Encounter (Signed)
Oncology Nurse Navigator Documentation  Oncology Nurse Navigator Flowsheets 02/25/2016  Navigator Location [facility]  Navigator Encounter Type Telephone  Telephone Outgoing Call;Incoming Call;Patient Update  Abnormal Finding Date 01/04/2016  Surgery Date 01/28/2016  Treatment Phase Pre-Tx/Tx Discussion  Barriers/Navigation Needs Coordination of Care  Interventions Coordination of Care--message to MD that she has chosen to have CAPOX instead of FOLFOX.  Coordination of Care Chemo-requests chemo on M-W only ; will follow up on her copay for Xeloda.  Acuity Level 2  Time Spent with Patient 15   Provided her with contact # for navigator if she has any questions.

## 2016-02-25 NOTE — Telephone Encounter (Signed)
Left VM on mobile # for her to call nurse navigator regarding her treatment decision: CAPOX vs FOLFOX.

## 2016-02-29 ENCOUNTER — Telehealth: Payer: Self-pay | Admitting: *Deleted

## 2016-02-29 ENCOUNTER — Other Ambulatory Visit: Payer: Self-pay | Admitting: Hematology

## 2016-02-29 ENCOUNTER — Telehealth: Payer: Self-pay | Admitting: Pharmacist

## 2016-02-29 DIAGNOSIS — C19 Malignant neoplasm of rectosigmoid junction: Secondary | ICD-10-CM

## 2016-02-29 MED ORDER — ONDANSETRON HCL 8 MG PO TABS: 8.0000 mg | ORAL_TABLET | Freq: Two times a day (BID) | ORAL | 1 refills | Status: DC | PRN

## 2016-02-29 MED ORDER — CAPECITABINE 500 MG PO TABS: 1000.0000 mg/m2 | ORAL_TABLET | Freq: Two times a day (BID) | ORAL | 3 refills | Status: DC

## 2016-02-29 MED ORDER — PROCHLORPERAZINE MALEATE 10 MG PO TABS: 10.0000 mg | ORAL_TABLET | Freq: Four times a day (QID) | ORAL | 1 refills | Status: DC | PRN

## 2016-02-29 NOTE — Telephone Encounter (Signed)
Received new Xeloda prescription. Faxed to [facility]. Fax confirmation received.  Patient has decided on Capox to start 03/04/16. Labs reviewed - Ok for treatment. Alk phos slightly elevated. Xeloda dose reviewed. Ok for treatment. Drug - drug interactions: Xeloda and Zofran (Category C):  May prolong QTc. Zofran is PRN. Will monitor. Xeloda and Pepcid (Category B):  May also prolong QTc. Pepcid is PRN. Will monitor.  Will continue to follow.   Brandy Persson, PharmD, BCPS, BCOP Oral Chemotherapy Clinic [phone] 

## 2016-02-29 NOTE — Telephone Encounter (Signed)
Oncology Nurse Navigator Documentation  Oncology Nurse Navigator Flowsheets 02/29/2016  Navigator Location [facility]  Navigator Encounter Type Telephone  Telephone Incoming Call;Outgoing Call;Diagnostic Results--provided patient with her negative results on chest CT  Abnormal Finding Date -  Surgery Date -  Treatment Phase -  Barriers/Navigation Needs Coordination of Care--1st chemo 10/30  Interventions Coordination of Care--message to infusion room scheduler to follow up on this for Lab/flush/OV/chemo. Instructed Shardai to call back if she has not heard from anyone by 10/25.  Coordination of Care -  Acuity -  Time Spent with Patient 15

## 2016-03-01 ENCOUNTER — Other Ambulatory Visit: Payer: Self-pay | Admitting: *Deleted

## 2016-03-01 DIAGNOSIS — C19 Malignant neoplasm of rectosigmoid junction: Secondary | ICD-10-CM

## 2016-03-01 MED ORDER — CAPECITABINE 500 MG PO TABS: 1000.0000 mg/m2 | ORAL_TABLET | Freq: Two times a day (BID) | ORAL | 3 refills | Status: DC

## 2016-03-01 NOTE — Telephone Encounter (Signed)
Script was e-scribed per Pharmacy request.

## 2016-03-01 NOTE — Telephone Encounter (Signed)
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  Xeloda Rx will be e-scribed to [facility] in [place], [place] by Jesse Fall, RN.  Oral Chemo Clinic will continue to follow.  Johny Drilling, PharmD, BCPS 03/01/2016  11:43 AM Oral Chemotherapy Clinic [phone]

## 2016-03-04 ENCOUNTER — Telehealth: Payer: Self-pay | Admitting: *Deleted

## 2016-03-04 ENCOUNTER — Other Ambulatory Visit: Payer: Self-pay | Admitting: *Deleted

## 2016-03-04 DIAGNOSIS — C189 Malignant neoplasm of colon, unspecified: Secondary | ICD-10-CM

## 2016-03-04 MED ORDER — LIDOCAINE-PRILOCAINE 2.5-2.5 % EX CREA: 1.0000 "application " | TOPICAL_CREAM | CUTANEOUS | 11 refills | Status: DC | PRN

## 2016-03-04 NOTE — Telephone Encounter (Signed)
Oncology Nurse Navigator Documentation  Oncology Nurse Navigator Flowsheets 03/04/2016  Navigator Location [facility]  Navigator Encounter Type Telephone  Telephone Outgoing Call;Medication Assistance  Abnormal Finding Date -  Surgery Date -  Treatment Phase Pre-Tx/Tx Discussion  Barriers/Navigation Needs Coordination of Care--Xeloda  Interventions Medication Assistance--called [facility] to f/u on status of Xeloda. Was informed they needed clarification on cycles. Provided necessary information as well as start date of 03/08/16. Was informed they will call patient as soon as possible.  Coordination of Care Other--Xeloda.   Acuity -  Time Spent with Patient 15

## 2016-03-04 NOTE — Telephone Encounter (Signed)
Left VM for patient with telephone # for her [facility] to call later today if they don't call her soon.

## 2016-03-07 ENCOUNTER — Telehealth: Payer: Self-pay | Admitting: *Deleted

## 2016-03-07 NOTE — Telephone Encounter (Signed)
Oncology Nurse Navigator Documentation  Oncology Nurse Navigator Flowsheets 03/07/2016  Navigator Location [facility]  Navigator Encounter Type Telephone  Telephone Incoming Call;Education  Abnormal Finding Date -  Surgery Date -  Treatment Phase -  Barriers/Navigation Needs Education--Xeloda/EMLA  Education Preparing for Upcoming Treatment  Interventions Education--reviewed to start Xeloda tomorrow after breakfast and dose is #4 tablets bid. Reviewed how to use EMLA cream and when to apply  Coordination of Care -  Education Method Teach-back  Acuity -  Time Spent with Patient 15

## 2016-03-08 ENCOUNTER — Encounter: Payer: Self-pay | Admitting: Hematology

## 2016-03-08 ENCOUNTER — Other Ambulatory Visit (HOSPITAL_BASED_OUTPATIENT_CLINIC_OR_DEPARTMENT_OTHER): Payer: Federal, State, Local not specified - PPO

## 2016-03-08 ENCOUNTER — Ambulatory Visit (HOSPITAL_BASED_OUTPATIENT_CLINIC_OR_DEPARTMENT_OTHER): Payer: Federal, State, Local not specified - PPO

## 2016-03-08 ENCOUNTER — Telehealth: Payer: Self-pay | Admitting: Hematology

## 2016-03-08 ENCOUNTER — Ambulatory Visit: Payer: Federal, State, Local not specified - PPO | Admitting: Nurse Practitioner

## 2016-03-08 ENCOUNTER — Ambulatory Visit (HOSPITAL_BASED_OUTPATIENT_CLINIC_OR_DEPARTMENT_OTHER): Payer: Federal, State, Local not specified - PPO | Admitting: Hematology

## 2016-03-08 ENCOUNTER — Encounter: Payer: Self-pay | Admitting: *Deleted

## 2016-03-08 VITALS — BP 135/58 | HR 79 | Temp 98.3°F | Resp 18 | Ht 67.0 in | Wt 185.2 lb

## 2016-03-08 DIAGNOSIS — C19 Malignant neoplasm of rectosigmoid junction: Secondary | ICD-10-CM | POA: Diagnosis not present

## 2016-03-08 DIAGNOSIS — Z5111 Encounter for antineoplastic chemotherapy: Secondary | ICD-10-CM | POA: Diagnosis not present

## 2016-03-08 DIAGNOSIS — C779 Secondary and unspecified malignant neoplasm of lymph node, unspecified: Secondary | ICD-10-CM | POA: Diagnosis not present

## 2016-03-08 DIAGNOSIS — N6092 Unspecified benign mammary dysplasia of left breast: Secondary | ICD-10-CM

## 2016-03-08 DIAGNOSIS — C20 Malignant neoplasm of rectum: Secondary | ICD-10-CM

## 2016-03-08 DIAGNOSIS — N6022 Fibroadenosis of left breast: Secondary | ICD-10-CM

## 2016-03-08 DIAGNOSIS — Z95828 Presence of other vascular implants and grafts: Secondary | ICD-10-CM

## 2016-03-08 LAB — COMPREHENSIVE METABOLIC PANEL
ALT: 22 U/L (ref 0–55)
AST: 17 U/L (ref 5–34)
Albumin: 3.7 g/dL (ref 3.5–5.0)
Alkaline Phosphatase: 236 U/L — ABNORMAL HIGH (ref 40–150)
Anion Gap: 11 mEq/L (ref 3–11)
BUN: 18.4 mg/dL (ref 7.0–26.0)
CHLORIDE: 107 meq/L (ref 98–109)
CO2: 23 meq/L (ref 22–29)
Calcium: 9.7 mg/dL (ref 8.4–10.4)
Creatinine: 0.7 mg/dL (ref 0.6–1.1)
EGFR: 89 mL/min/{1.73_m2} — AB (ref >90)
GLUCOSE: 108 mg/dL (ref 70–140)
POTASSIUM: 4.2 meq/L (ref 3.5–5.1)
SODIUM: 141 meq/L (ref 136–145)
Total Bilirubin: 0.44 mg/dL (ref 0.20–1.20)
Total Protein: 6.8 g/dL (ref 6.4–8.3)

## 2016-03-08 LAB — CBC WITH DIFFERENTIAL/PLATELET
BASO%: 1.4 % (ref 0.0–2.0)
BASOS ABS: 0.1 10*3/uL (ref 0.0–0.1)
EOS ABS: 0.1 10*3/uL (ref 0.0–0.5)
EOS%: 1.6 % (ref 0.0–7.0)
HCT: 41.3 % (ref 34.8–46.6)
HGB: 13.4 g/dL (ref 11.6–15.9)
LYMPH%: 16.9 % (ref 14.0–49.7)
MCH: 28.3 pg (ref 25.1–34.0)
MCHC: 32.6 g/dL (ref 31.5–36.0)
MCV: 86.8 fL (ref 79.5–101.0)
MONO#: 0.5 10*3/uL (ref 0.1–0.9)
MONO%: 7.3 % (ref 0.0–14.0)
NEUT%: 72.8 % (ref 38.4–76.8)
NEUTROS ABS: 5.1 10*3/uL (ref 1.5–6.5)
Platelets: 169 10*3/uL (ref 145–400)
RBC: 4.76 10*6/uL (ref 3.70–5.45)
RDW: 14.2 % (ref 11.2–14.5)
WBC: 7 10*3/uL (ref 3.9–10.3)
lymph#: 1.2 10*3/uL (ref 0.9–3.3)

## 2016-03-08 LAB — CEA (IN HOUSE-CHCC): CEA (CHCC-In House): 1.55 ng/mL (ref 0.00–5.00)

## 2016-03-08 MED ORDER — DEXTROSE 5 % IV SOLN
Freq: Once | INTRAVENOUS | Status: AC
  Administered 2016-03-08: 12:00:00 via INTRAVENOUS

## 2016-03-08 MED ORDER — SODIUM CHLORIDE 0.9% FLUSH
10.0000 mL | INTRAVENOUS | Status: DC | PRN
  Administered 2016-03-08: 10 mL
  Filled 2016-03-08: qty 10

## 2016-03-08 MED ORDER — PALONOSETRON HCL INJECTION 0.25 MG/5ML
INTRAVENOUS | Status: AC
  Filled 2016-03-08: qty 5

## 2016-03-08 MED ORDER — DEXAMETHASONE SODIUM PHOSPHATE 10 MG/ML IJ SOLN
10.0000 mg | Freq: Once | INTRAMUSCULAR | Status: AC
  Administered 2016-03-08: 10 mg via INTRAVENOUS

## 2016-03-08 MED ORDER — SODIUM CHLORIDE 0.9% FLUSH
10.0000 mL | INTRAVENOUS | Status: DC | PRN
  Administered 2016-03-08: 10 mL via INTRAVENOUS
  Filled 2016-03-08: qty 10

## 2016-03-08 MED ORDER — HEPARIN SOD (PORK) LOCK FLUSH 100 UNIT/ML IV SOLN
500.0000 [IU] | Freq: Once | INTRAVENOUS | Status: AC | PRN
Start: 2016-03-08 — End: 2016-03-08
  Administered 2016-03-08: 500 [IU]
  Filled 2016-03-08: qty 5

## 2016-03-08 MED ORDER — PALONOSETRON HCL INJECTION 0.25 MG/5ML
0.2500 mg | Freq: Once | INTRAVENOUS | Status: AC
  Administered 2016-03-08: 0.25 mg via INTRAVENOUS

## 2016-03-08 MED ORDER — DEXTROSE 5 % IV SOLN
126.0000 mg/m2 | Freq: Once | INTRAVENOUS | Status: AC
  Administered 2016-03-08: 250 mg via INTRAVENOUS
  Filled 2016-03-08: qty 10

## 2016-03-08 MED ORDER — DEXAMETHASONE SODIUM PHOSPHATE 10 MG/ML IJ SOLN
INTRAMUSCULAR | Status: AC
  Filled 2016-03-08: qty 1

## 2016-03-08 NOTE — Progress Notes (Deleted)
[place]  [phone] [phone]) [phone]  Clinic Follow up Note   Patient Care Team: [name], MD as PCP - General (Family Medicine) Autumn Messing III, MD as Consulting Physician (General Surgery) 03/08/2016  REFERRAL PHYSICIAN: Dr. Marlou Starks  CHIEF COMPLAINTS:  Recently diagnosed stage III colon cancer  Oncology History   Rectal cancer [facility])   Staging form: Colon and Rectum, AJCC 7th Edition   - Pathologic stage from 01/28/2016: Stage IIIC (T3, N2b, cM0) - Signed by Truitt Merle, MD on 02/12/2016      Colorectal cancer ([place])   01/04/2016 Procedure    Colonoscopy by Dr. Estanislado Emms showed a partially obstructing tumor in the rectal sigmoid colon, biopsied.      01/05/2016 Imaging    CT abdomen and pelvis with contrast showed no dominant sigmoid colon mass, soft tissue fullness at the rectosigmoid junction, suspicious for the primary lesion. Borderline enlarged nodes in the sigmoid mesocolon and at the origin of the inferior mesenteric artery are indeterminate no evidence of December metastasis. Left hepatic lobe giant hemangioma.       01/28/2016 Initial Diagnosis    Rectal cancer ([place])     01/28/2016 Surgery    Endoscopic low anterior resection of rectosigmoid segmental colon for tumor       01/28/2016 Pathology Results    Invasive colonic adenocarcinoma, 4.5 cm extending into her chronic connective tissue, lymphovascular involvement by tumor, margins were negative, metastatic carcinoma in 9 of 21 lymph nodes.       HISTORY OF PRESENTING ILLNESS (05/14/2015):  Bethany Frederick 65 y.o. female is here to discussed her risk of breast cancer.   She has had palpable left breast mass for over 20 years, unchanged. Her mammogram in 2014 showed a 0.8 cm cyst. She does screening mammogram once a year. The lesion looks slightly more suspicious on the mammogram in August 2016, and a biopsy was recommended, which showed sclerosing lesion with typical ductal hyperplasia.  She was referred to see Dr. Marlou Starks and underwent left breast lumpectomy. She tolerated the surgery very well, and has recovered completely.  She feels well, has mild fatigue, she takes care two of her brothers, who live alone and have health issues. She lives with her hsuband and 57 yo son. She remains physically active, does not exercise regularly. She has mild arthritis, takes ibuprofen and Aleve as needed, no other complaints.  GYN HISTORY  Menarchal: 10 LMP: 59 Contraceptive: 23 years  HRT: 5 years, stopped at age of 3  G1P1: one son, she was 65 yo when she had her son   INTERIM HISTORY  Bethany Frederick returns for follow-up. She is accompanied by her husband to clinic today. I previously saw her 9 months ago for her high-risk for breast cancer. She started having diarrhea and small rectal bleeding in Feb 2017, no weight loss or other symptoms. She changed her primary care physician, and underwent some lab and image test, finally she was referred to Dr. Estanislado Emms for colonoscopy in August. Unfortunately, she was found to have a large partially obstructing mass in the rectosigmoid colon, biopsy showed adenocarcinoma. She underwent low anterior resection of her colon cancer on 01/28/2016.   She has recovered well from surgery, staples removed, she is on antibiotcis for small wound infection. Her appetite has been back to normal, energy level back to 75% of her baseline. She has been tolerating routine daily activities without much difficulties. No segmental pain at the incision. No other new complaints.  MEDICAL HISTORY:  Past Medical History:  Diagnosis Date  . Arthritis    back, hip, feet  . Cancer ([place])    Sigmoid colon  . Cataract, immature   . GERD (gastroesophageal reflux disease)   . Heart palpitations   . 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 Seasonal allergies   . Wears partial dentures    upper front - 1 tooth    SURGICAL HISTORY: Past Surgical History:  Procedure Laterality  Date  . APPENDECTOMY    . BREAST LUMPECTOMY WITH RADIOACTIVE SEED LOCALIZATION Left 03/13/2015   Procedure: BREAST LUMPECTOMY WITH RADIOACTIVE SEED LOCALIZATION;  Surgeon: Autumn Messing III, MD;  Location: [place];  Service: General;  Laterality: Left;  . COLONOSCOPY    . ENDOMETRIAL FULGURATION  06/13/2000  . INGUINAL HERNIA REPAIR  03/05/2012   Procedure: HERNIA REPAIR INGUINAL INCARCERATED;  Surgeon: Ralene Ok, MD;  Location: [place];  Service: General;  Laterality: N/A;  . INSERTION OF MESH  03/05/2012   Procedure: INSERTION OF MESH;  Surgeon: Ralene Ok, MD;  Location: [place];  Service: General;  Laterality: Left;  . LAPAROSCOPIC SIGMOID COLECTOMY Bilateral 01/28/2016   Procedure: LAPAROSCOPIC ASSISTED SIGMOID COLECTOMY;  Surgeon: Autumn Messing III, MD;  Location: [place];  Service: General;  Laterality: Bilateral;  . MYOMECTOMY  06/13/2000  . PORTACATH PLACEMENT N/A 02/18/2016   Procedure: INSERTION PORT-A-CATH;  Surgeon: Autumn Messing III, MD;  Location: [place];  Service: General;  Laterality: N/A;  . ROTATOR CUFF REPAIR Left    2013    SOCIAL HISTORY: Social History   Social History  . Marital status: Married    Spouse name: N/A  . Number of children: N/A  . Years of education: N/A   Occupational History  . Not on file.   Social History Main Topics  . Smoking status: Never Smoker  . Smokeless tobacco: Never Used  . Alcohol use Yes     Comment: occasionally  . Drug use: No  . Sexual activity: Not Currently   Other Topics Concern  . Not on file   Social History Narrative  . No narrative on file    FAMILY HISTORY: Family History  Problem Relation Age of Onset  . Cancer Brother 82    prostate cancer   . Cancer Paternal Aunt 54    breast cancer   . Cancer Cousin 40    ovarian and breast cancer (maternal cousin)     ALLERGIES:  is allergic to no known allergies.  MEDICATIONS:  Current Outpatient Prescriptions  Medication Sig  Dispense Refill  . capecitabine (XELODA) 500 MG tablet Take 4 tablets (2,000 mg total) by mouth 2 (two) times daily after a meal. Take on days 1-14 of chemotherapy. 112 tablet 3  . clobetasol cream (TEMOVATE) 7.61 % Apply 1 application topically daily as needed (RASH).     . famotidine (PEPCID) 20 MG tablet Take 20 mg by mouth 2 (two) times daily as needed for heartburn or indigestion.   0  . HYDROcodone-acetaminophen (NORCO/VICODIN) 5-325 MG tablet Take 1-2 tablets by mouth every 4 (four) hours as needed for moderate pain or severe pain. 20 tablet 0  . lidocaine-prilocaine (EMLA) cream Apply 1 application topically as needed. Apply to port site 1 hour prior to stick and cover w/plastic wrap 30 g 11  . ondansetron (ZOFRAN) 8 MG tablet Take 1 tablet (8 mg total) by mouth 2 (two) times daily as needed for refractory nausea / vomiting. Start  on day 3 after chemotherapy. 30 tablet 1  . prochlorperazine (COMPAZINE) 10 MG tablet Take 1 tablet (10 mg total) by mouth every 6 (six) hours as needed (Nausea or vomiting). 30 tablet 1   No current facility-administered medications for this visit.     REVIEW OF SYSTEMS:   Constitutional: Denies fevers, chills or abnormal night sweats Eyes: Denies blurriness of vision, double vision or watery eyes Ears, nose, mouth, throat, and face: Denies mucositis or sore throat Respiratory: Denies cough, dyspnea or wheezes Cardiovascular: Denies palpitation, chest discomfort or lower extremity swelling Gastrointestinal:  Denies nausea, heartburn or change in bowel habits Skin: Denies abnormal skin rashes Lymphatics: Denies new lymphadenopathy or easy bruising Neurological:Denies numbness, tingling or new weaknesses Behavioral/Psych: Mood is stable, no new changes  All other systems were reviewed with the patient and are negative.  PHYSICAL EXAMINATION: ECOG PERFORMANCE STATUS: 1 - Symptomatic but completely ambulatory  There were no vitals filed for this  visit. There were no vitals filed for this visit.  GENERAL:alert, no distress and comfortable SKIN: skin color, texture, turgor are normal, no rashes or significant lesions EYES: normal, conjunctiva are pink and non-injected, sclera clear OROPHARYNX:no exudate, no erythema and lips, buccal mucosa, and tongue normal  NECK: supple, thyroid normal size, non-tender, without nodularity LYMPH:  no palpable lymphadenopathy in the cervical, axillary or inguinal LUNGS: clear to auscultation and percussion with normal breathing effort HEART: regular rate & rhythm and no murmurs and no lower extremity edema ABDOMEN:abdomen soft, non-tender and normal bowel sounds, midline incision has healed well, no discharge or skin erythema.  Musculoskeletal:no cyanosis of digits and no clubbing  PSYCH: alert & oriented x 3 with fluent speech NEURO: no focal motor/sensory deficits  LABORATORY DATA:  I have reviewed the data as listed CBC Latest Ref Rng & Units 02/12/2016 01/29/2016 01/25/2016  WBC 3.9 - 10.3 10e3/uL 7.3 9.4 8.7  Hemoglobin 11.6 - 15.9 g/dL 12.7 12.6 13.8  Hematocrit 34.8 - 46.6 % 39.5 39.7 42.7  Platelets 145 - 400 10e3/uL 261 178 211   CMP Latest Ref Rng & Units 02/12/2016 01/29/2016 01/25/2016  Glucose 70 - 140 mg/dl 115 135(H) 100(H)  BUN 7.0 - 26.0 mg/dL 13.5 <5(L) 14  Creatinine 0.6 - 1.1 mg/dL 0.8 0.62 0.60  Sodium 136 - 145 mEq/L 142 140 140  Potassium 3.5 - 5.1 mEq/L 4.1 3.6 4.5  Chloride 101 - 111 mmol/L - 106 108  CO2 22 - 29 mEq/L 25 27 24   Calcium 8.4 - 10.4 mg/dL 9.5 8.5(L) 9.7  Total Protein 6.4 - 8.3 g/dL 6.6 - -  Total Bilirubin 0.20 - 1.20 mg/dL 0.38 - -  Alkaline Phos 40 - 150 U/L 270(H) - -  AST 5 - 34 U/L 16 - -  ALT 0 - 55 U/L 30 - -    PATHOLOGY REPORT  Diagnosis 01/04/2016 Colon, biopsy, rectosigmoid junction - ADENOCARCINOMA, SEE COMMENT. Microscopic Comment Dr. Saralyn Pilar reviewed this case and concurs. The results were discussed with Dr. Watt Climes on 01/05/16. (BNS:gt,  01/05/16)  Diagnosis 01/28/2016 1. Soft tissue, biopsy, Implant on sigmoid colon - BENIGN CALCIFIED NODULE. - NO EVIDENCE OF MALIGNANCY. 2. Colon, segmental resection for tumor, Recto-sigmoid - INVASIVE COLONIC ADENOCARCINOMA, 4.5 CM EXTENDING INTO PERICOLONIC CONNECTIVE TISSUE. - LYMPHATIC VASCULAR INVOLVEMENT BY TUMOR. - MARGINS NOT INVOLVED. - METASTATIC CARCINOMA IN 9 OF 21 LYMPH NODES (9/21). - SEE ONCOLOGY TABLE BELOW. 3. Colon, resection margin (donut), Proximal and distal of sigmoid - BENIGN PROXIMAL AND DISTAL ANASTOMATIC RINGS. -  NO EVIDENCE OF MALIGNANCY. Microscopic Comment 2. COLON AND RECTUM (INCLUDING TRANS-ANAL RESECTION): Specimen: Sigmoid colon. Procedure: Segmental resection. Tumor site: Rectosigmoid. Specimen integrity: Intact. Macroscopic intactness of mesorectum: Near complete. Macroscopic tumor perforation: No. Invasive tumor: Maximum size: 4.5 cm. Histologic type(s): Colorectal adenocarcinoma. Histologic grade and differentiation: G2: moderately differentiated/low grade. Type of polyp in which invasive carcinoma arose: No residual polyp. Microscopic extension of invasive tumor: Focally into pericolonic connective tissue. Lymph-Vascular invasion: Present. Peri-neural invasion: No. Tumor deposit(s) (discontinuous extramural extension): No. Resection margins: Proximal margin: Free of tumor. Distal margin: Free of tumor. Circumferential (radial) (posterior ascending, posterior descending; lateral and posterior mid-rectum; and entire lower 1/3 rectum): Free of tumor. Mesenteric margin (sigmoid and transverse): Free of tumor. Distance closest margin (if all above margins negative): 1.5 cm from distal margin. Treatment effect (neo-adjuvant therapy): No. Additional polyp(s): No. Non-neoplastic findings: Benign diverticulum. Lymph nodes: number examined 21; number positive: 9. Pathologic Staging: pT3, pN2b, pMX. Ancillary studies: Microsatellite instability by  PCR and mismatch repair protein by immunohistochemistry pending. (JDP:ds 02/01/16) Claudette Laws MD Pathologist, Electronic Signature (Case signed 02/01/2016) Specimen Gross and Clinical Information  RADIOGRAPHIC STUDIES: I have personally reviewed the radiological images as listed and agreed with the findings in the report. Ct Chest Wo Contrast  Result Date: 02/24/2016 CLINICAL DATA:  Colon cancer diagnosed August 2017. History of surgical resection. EXAM: CT CHEST WITHOUT CONTRAST TECHNIQUE: Multidetector CT imaging of the chest was performed following the standard protocol without IV contrast. COMPARISON:  CT 01/05/2016 MRI 11/06/2015 FINDINGS: Cardiovascular: Coronary artery calcification and aortic atherosclerotic calcification. central venous line noted Mediastinum/Nodes: No axillary or supraclavicular lymphadenopathy. No mediastinal hilar adenopathy. Esophagus normal. Lungs/Pleura: There is atelectasis at the lung bases. Upper Abdomen: Large round lesion in the LEFT hepatic lobe measuring 4.6 cm is again demonstrated and characterized as a hemangioma on comparison MRI. Musculoskeletal: No aggressive osseous lesion. IMPRESSION: 1. No evidence of pulmonary metastasis. 2. Stable large hepatic hemangioma. Electronically Signed   By: Suzy Bouchard M.D.   On: 02/24/2016 14:04   Dg Chest Port 1 View  Result Date: 02/18/2016 CLINICAL DATA:  Status post Port-A-Cath placement. EXAM: PORTABLE CHEST 1 VIEW COMPARISON:  None. FINDINGS: Cardiomegaly is noted. Left subclavian Port-A-Cath is noted with distal tip in expected position of the SVC. No pneumothorax or pleural effusion is noted. Mild bibasilar subsegmental atelectasis is noted. Postsurgical changes are seen in the left humeral head. IMPRESSION: No pneumothorax status post left subclavian Port-A-Cath placement. Electronically Signed   By: Marijo Conception, M.D.   On: 02/18/2016 15:35   Dg Fluoro Guide Cv Line-no Report  Result Date:  02/18/2016 CLINICAL DATA:  FLOURO GUIDE CV LINE Fluoroscopy was utilized by the requesting physician.  No radiographic interpretation.   CT abdomen and pelvis with contrast 01/05/2016 IMPRESSION: 1. No dominant sigmoid colon mass identified. There is underdistention and soft tissue fullness at the rectosigmoid junction, suspicious for the primary lesion. Borderline enlarged nodes in the sigmoid mesocolon and at the origin of the inferior mesenteric artery are indeterminate. As these are in the drainage pathway for a sigmoid primary, localized nodal metastasis cannot be excluded. 2. No evidence of distant metastatic disease. 3.  Aortic atherosclerosis. 4. Uterine fibroid. 5. Left hepatic lobe giant hemangioma, as before.  CT chest 02/24/2016 IMPRESSION: 1. No evidence of pulmonary metastasis. 2. Stable large hepatic hemangioma.   Colonoscopy 01/04/2016 Dr. Watt Climes Findings: An ulcerated partially obstructing large mass was found in the rectosigmoid colon. The mass was partially circumferential, measured about  5 cm in length. Biopsy was taken. Multiple small milestone diverticula were found in the sigmoid colon. The exam otherwise was normal.   ASSESSMENT & PLAN:  65 year old Caucasian female, with recently diagnosed colorectal cancer   1. Colorectal cancer, invasive adenocarcinoma,pT3N2bM0, stage IIIC, MSI-stable  -Her primary cancer is located in the rectosigmoid colon. -I reviewed her CT abdomen and pelvis, colonoscopy, and her surgical pathology findings in great details with patient and her husband. -She has had complete surgical resection -I'll obtain a CT chest without contrast to complete her staging -Giving the multiple positive lymph nodes, and locally advanced disease, she is at high risk for recurrence. -I recommend adjuvant chemotherapy to reduce her risk of recurrence, treated options of FOLFOX and CAPOX were discussed with pt and her husband -Based on the recently  reported data, 3 months oxaliplatin-based adjuvant chemotherapy has similar outcome as 6 months imminent. However due to her multiple positive lymph nodes, I will recommend 6 months therapy if she can tolerate. If she does have significant side effects, I'll back off her chemotherapy or even stop after 3 months of therapy.  --Chemotherapy consent: Side effects including but does not not limited to, fatigue, nausea, vomiting, diarrhea, hair loss, neuropathy, fluid retention, renal and kidney dysfunction, neutropenic fever, needed for blood transfusion, bleeding, coronary artery spasm and heart attack, were discussed with patient in great detail. She agrees to proceed. -she agrees with port placement -She will think about which regimen she would like to take.  -I plan to start her treatment in about 3 weeks, she has recovered relatively well from her surgery. -I discussed the risk of cancer recurrence in the future. I discussed the surveillance plan, which is a physical exam and lab test (including CBC, CMP and CEA) every 3 months for the first 2 years, then every 6-12 months, colonoscopy in one year, and surveilliance CT scan every 6-12 month for up to 5 year.   2. 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 This is considered a benign breast lesion, not high risk for breast cancer. -I used the [employer] Jan[employer] model to calculate her risk of developing breast cancer, Based on her age, family history, pregnancy history, etc, her risk of developing breast cancer is 3.3% in 5 years and 11.7% in her lifetime. This is consider moderate risk. -We reviewed the risk factors for developing breast cancer, and strategies to prevent breast cancer, especially healthy diet, exercise, and maintain ideal weight.  -According to the at FDA indications, based on her risk of breast cancer estimated from [place], she is qualified for  chemoprevention with tamoxifen or anastrozole. Potential benefit and risks (side effects) were discussed with her in great details. I given her the written material of tamoxifen and anastrozole for her to review. Given her overall moderate risk, I do not strongly feel she needs to take chemoprevention. She agrees with it. -We finally discussed the breast cancer screening, including yearly screening mammogram with 3-D technology, self-exam every months and physician exam breast exam once a year.    Plan -CT chest without contrast in the next few weeks  -Port placement by Dr. Marlou Starks -She will call me next week and let me know if she wants FOlfox or Capeox -Chemotherapy class in follow-up in 2 weeks, I plan to start her on chemotherapy in 3 weeks    All questions were answered. The patient knows to call the clinic with  any problems, questions or concerns.  I spent 40 minutes counseling the patient face to face. The total time spent in the appointment was 50 minutes and more than 50% was on counseling.     Truitt Merle, MD 03/08/2016 6:39 AM

## 2016-03-08 NOTE — Telephone Encounter (Signed)
AVS report and appointment schedule given to patient per 03/08/16 los. °

## 2016-03-08 NOTE — Progress Notes (Signed)
Oncology Nurse Navigator Documentation  Oncology Nurse Navigator Flowsheets 03/08/2016  Navigator Location [facility]  Navigator Encounter Type Treatment  Telephone -  Abnormal Finding Date -  Confirmed Diagnosis Date 01/04/2016  Surgery Date -  Treatment Initiated Date 03/08/2016  Patient Visit Type MedOnc  Treatment Phase First Chemo Tx--CAPOX #1  Barriers/Navigation Needs Education  Education Symptom Management--how to take antiemetics and when to call office; explained the scheduling process for provider and chemo room. She will f/u if she has not heard by next week regarding next chemo appointment due 11/21  Interventions Education  Coordination of Care -  Education Method Verbal;Teach-back  Support Groups/Services GI Support Group--provided November calendar of support events and noted date for GI group  Acuity Level 2  Time Spent with Patient 15

## 2016-03-08 NOTE — Patient Instructions (Signed)
[facility] Discharge Instructions for Patients Receiving Chemotherapy  Today you received the following chemotherapy agents: Oxaliplatin   To help prevent nausea and vomiting after your treatment, we encourage you to take your nausea medication as directed.    If you develop nausea and vomiting that is not controlled by your nausea medication, call the clinic.   BELOW ARE SYMPTOMS THAT SHOULD BE REPORTED IMMEDIATELY:  *FEVER GREATER THAN 100.5 F  *CHILLS WITH OR WITHOUT FEVER  NAUSEA AND VOMITING THAT IS NOT CONTROLLED WITH YOUR NAUSEA MEDICATION  *UNUSUAL SHORTNESS OF BREATH  *UNUSUAL BRUISING OR BLEEDING  TENDERNESS IN MOUTH AND THROAT WITH OR WITHOUT PRESENCE OF ULCERS  *URINARY PROBLEMS  *BOWEL PROBLEMS  UNUSUAL RASH Items with * indicate a potential emergency and should be followed up as soon as possible.  Feel free to call the clinic you have any questions or concerns. The clinic phone number is [phone].  Please show the CHEMO ALERT CARD at check-in to the Emergency Department and triage nurse.   Oxaliplatin Injection What is this medicine? OXALIPLATIN (ox AL i PLA tin) is a chemotherapy drug. It targets fast dividing cells, like cancer cells, and causes these cells to die. This medicine is used to treat cancers of the colon and rectum, and many other cancers. This medicine may be used for other purposes; ask your health care provider or pharmacist if you have questions. What should I tell my health care provider before I take this medicine? They need to know if you have any of these conditions: -kidney disease -an unusual or allergic reaction to oxaliplatin, other chemotherapy, other medicines, foods, dyes, or preservatives -pregnant or trying to get pregnant -breast-feeding How should I use this medicine? This drug is given as an infusion into a vein. It is administered in a hospital or clinic by a specially trained health care  professional. Talk to your pediatrician regarding the use of this medicine in children. Special care may be needed. Overdosage: If you think you have taken too much of this medicine contact a poison control center or emergency room at once. NOTE: This medicine is only for you. Do not share this medicine with others. What if I miss a dose? It is important not to miss a dose. Call your doctor or health care professional if you are unable to keep an appointment. What may interact with this medicine? -medicines to increase blood counts like filgrastim, pegfilgrastim, sargramostim -probenecid -some antibiotics like amikacin, gentamicin, neomycin, polymyxin B, streptomycin, tobramycin -zalcitabine Talk to your doctor or health care professional before taking any of these medicines: -acetaminophen -aspirin -ibuprofen -ketoprofen -naproxen This list may not describe all possible interactions. Give your health care provider a list of all the medicines, herbs, non-prescription drugs, or dietary supplements you use. Also tell them if you smoke, drink alcohol, or use illegal drugs. Some items may interact with your medicine. What should I watch for while using this medicine? Your condition will be monitored carefully while you are receiving this medicine. You will need important blood work done while you are taking this medicine. This medicine can make you more sensitive to cold. Do not drink cold drinks or use ice. Cover exposed skin before coming in contact with cold temperatures or cold objects. When out in cold weather wear warm clothing and cover your mouth and nose to warm the air that goes into your lungs. Tell your doctor if you get sensitive to the cold. This drug may make you   feel generally unwell. This is not uncommon, as chemotherapy can affect healthy cells as well as cancer cells. Report any side effects. Continue your course of treatment even though you feel ill unless your doctor tells you  to stop. In some cases, you may be given additional medicines to help with side effects. Follow all directions for their use. Call your doctor or health care professional for advice if you get a fever, chills or sore throat, or other symptoms of a cold or flu. Do not treat yourself. This drug decreases your body's ability to fight infections. Try to avoid being around people who are sick. This medicine may increase your risk to bruise or bleed. Call your doctor or health care professional if you notice any unusual bleeding. Be careful brushing and flossing your teeth or using a toothpick because you may get an infection or bleed more easily. If you have any dental work done, tell your dentist you are receiving this medicine. Avoid taking products that contain aspirin, acetaminophen, ibuprofen, naproxen, or ketoprofen unless instructed by your doctor. These medicines may hide a fever. Do not become pregnant while taking this medicine. Women should inform their doctor if they wish to become pregnant or think they might be pregnant. There is a potential for serious side effects to an unborn child. Talk to your health care professional or pharmacist for more information. Do not breast-feed an infant while taking this medicine. Call your doctor or health care professional if you get diarrhea. Do not treat yourself. What side effects may I notice from receiving this medicine? Side effects that you should report to your doctor or health care professional as soon as possible: -allergic reactions like skin rash, itching or hives, swelling of the face, lips, or tongue -low blood counts - This drug may decrease the number of white blood cells, red blood cells and platelets. You may be at increased risk for infections and bleeding. -signs of infection - fever or chills, cough, sore throat, pain or difficulty passing urine -signs of decreased platelets or bleeding - bruising, pinpoint red spots on the skin, black,  tarry stools, nosebleeds -signs of decreased red blood cells - unusually weak or tired, fainting spells, lightheadedness -breathing problems -chest pain, pressure -cough -diarrhea -jaw tightness -mouth sores -nausea and vomiting -pain, swelling, redness or irritation at the injection site -pain, tingling, numbness in the hands or feet -problems with balance, talking, walking -redness, blistering, peeling or loosening of the skin, including inside the mouth -trouble passing urine or change in the amount of urine Side effects that usually do not require medical attention (report to your doctor or health care professional if they continue or are bothersome): -changes in vision -constipation -hair loss -loss of appetite -metallic taste in the mouth or changes in taste -stomach pain This list may not describe all possible side effects. Call your doctor for medical advice about side effects. You may report side effects to FDA at 1-800-FDA-1088. Where should I keep my medicine? This drug is given in a hospital or clinic and will not be stored at home. NOTE: This sheet is a summary. It may not cover all possible information. If you have questions about this medicine, talk to your doctor, pharmacist, or health care provider.    2016, [employer]. (2007-11-20 17:22:47)  

## 2016-03-08 NOTE — Progress Notes (Signed)
[facility]  [phone] [phone]  Clinic Follow up Note   Patient Care Team: [name], MD as PCP - General (Family Medicine) Autumn Messing III, MD as Consulting Physician (General Surgery) 03/08/2016   CHIEF COMPLAINTS:  Recently diagnosed stage III colon cancer   Oncology History   Rectal cancer [facility])   Staging form: Colon and Rectum, AJCC 7th Edition   - Pathologic stage from 01/28/2016: Stage IIIC (T3, N2b, cM0) - Signed by Truitt Merle, MD on 02/12/2016      Colorectal cancer ([place])   01/04/2016 Procedure    Colonoscopy by Dr. Estanislado Emms showed a partially obstructing tumor in the rectal sigmoid colon, biopsied.      01/05/2016 Imaging    CT abdomen and pelvis with contrast showed no dominant sigmoid colon mass, soft tissue fullness at the rectosigmoid junction, suspicious for the primary lesion. Borderline enlarged nodes in the sigmoid mesocolon and at the origin of the inferior mesenteric artery are indeterminate no evidence of December metastasis. Left hepatic lobe giant hemangioma.       01/28/2016 Initial Diagnosis    Rectal cancer ([place])     01/28/2016 Surgery    Endoscopic low anterior resection of rectosigmoid segmental colon for tumor       01/28/2016 Pathology Results    Invasive colonic adenocarcinoma, 4.5 cm extending into her chronic connective tissue, lymphovascular involvement by tumor, margins were negative, metastatic carcinoma in 9 of 21 lymph nodes.       HISTORY OF PRESENTING ILLNESS (05/14/2015):  Bethany Frederick 65 y.o. female is here to discussed her risk of breast cancer.   She has had palpable left breast mass for over 20 years, unchanged. Her mammogram in 2014 showed a 0.8 cm cyst. She does screening mammogram once a year. The lesion looks slightly more suspicious on the mammogram in August 2016, and a biopsy was recommended, which showed sclerosing lesion with typical ductal hyperplasia. She was referred to see Dr.  Marlou Starks and underwent left breast lumpectomy. She tolerated the surgery very well, and has recovered completely.  She feels well, has mild fatigue, she takes care two of her brothers, who live alone and have health issues. She lives with her hsuband and 32 yo son. She remains physically active, does not exercise regularly. She has mild arthritis, takes ibuprofen and Aleve as needed, no other complaints.  GYN HISTORY  Menarchal: 10 LMP: 48 Contraceptive: 23 years  HRT: 5 years, stopped at age of 28  G1P1: one son, she was 16 yo when she had her son   CURRENT THERAPY: Adjuvant chemotherapy with CAPOX (Xeloda 2000 mg twice daily, on day 1-14, oxaliplatin 144m/m2 on day 1) every 3 weeks, starting 03/08/2016  INTERIM HISTORY  Bethany STescheturns for follow-up. She is accompanied by her husband to clinic today. She goes by "Bethany Frederick. She attended chemotherapy teaching today. She received her chemotherapy pill yesterday.   Since our last visit, she had a port placed on 02/18/2016 with Dr. TMarlou Starks Her port was accessed and flushed today without issue.   She denies any pain. She has no concerns or complaints at this time.   She has received a flu shot this year.   MEDICAL HISTORY:  Past Medical History:  Diagnosis Date  . Arthritis    back, hip, feet  . Cancer ([place]    Sigmoid colon  . Cataract, immature   . GERD (gastroesophageal reflux disease)   . Heart palpitations   . 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 Seasonal allergies   . Wears partial dentures    upper front - 1 tooth    SURGICAL HISTORY: Past Surgical History:  Procedure Laterality Date  . APPENDECTOMY    . BREAST LUMPECTOMY WITH RADIOACTIVE SEED LOCALIZATION Left 03/13/2015   Procedure: BREAST LUMPECTOMY WITH RADIOACTIVE SEED LOCALIZATION;  Surgeon: Autumn Messing III, MD;  Location: [place];  Service: General;  Laterality: Left;  . COLONOSCOPY    . ENDOMETRIAL FULGURATION  06/13/2000  . INGUINAL HERNIA  REPAIR  03/05/2012   Procedure: HERNIA REPAIR INGUINAL INCARCERATED;  Surgeon: Ralene Ok, MD;  Location: [place];  Service: General;  Laterality: N/A;  . INSERTION OF MESH  03/05/2012   Procedure: INSERTION OF MESH;  Surgeon: Ralene Ok, MD;  Location: [place];  Service: General;  Laterality: Left;  . LAPAROSCOPIC SIGMOID COLECTOMY Bilateral 01/28/2016   Procedure: LAPAROSCOPIC ASSISTED SIGMOID COLECTOMY;  Surgeon: Autumn Messing III, MD;  Location: [place];  Service: General;  Laterality: Bilateral;  . MYOMECTOMY  06/13/2000  . PORTACATH PLACEMENT N/A 02/18/2016   Procedure: INSERTION PORT-A-CATH;  Surgeon: Autumn Messing III, MD;  Location: [place];  Service: General;  Laterality: N/A;  . ROTATOR CUFF REPAIR Left    2013    SOCIAL HISTORY: Social History   Social History  . Marital status: Married    Spouse name: N/A  . Number of children: N/A  . Years of education: N/A   Occupational History  . Not on file.   Social History Main Topics  . Smoking status: Never Smoker  . Smokeless tobacco: Never Used  . Alcohol use Yes     Comment: occasionally  . Drug use: No  . Sexual activity: Not Currently   Other Topics Concern  . Not on file   Social History Narrative  . No narrative on file    FAMILY HISTORY: Family History  Problem Relation Age of Onset  . Cancer Brother 86    prostate cancer   . Cancer Paternal Aunt 42    breast cancer   . Cancer Cousin 40    ovarian and breast cancer (maternal cousin)     ALLERGIES:  is allergic to no known allergies.  MEDICATIONS:  Current Outpatient Prescriptions  Medication Sig Dispense Refill  . capecitabine (XELODA) 500 MG tablet Take 4 tablets (2,000 mg total) by mouth 2 (two) times daily after a meal. Take on days 1-14 of chemotherapy. 112 tablet 3  . clobetasol cream (TEMOVATE) 0.24 % Apply 1 application topically daily as needed (RASH).     . famotidine (PEPCID) 20 MG tablet Take 20 mg by mouth 2 (two) times  daily as needed for heartburn or indigestion.   0  . HYDROcodone-acetaminophen (NORCO/VICODIN) 5-325 MG tablet Take 1-2 tablets by mouth every 4 (four) hours as needed for moderate pain or severe pain. 20 tablet 0  . lidocaine-prilocaine (EMLA) cream Apply 1 application topically as needed. Apply to port site 1 hour prior to stick and cover w/plastic wrap 30 g 11  . ondansetron (ZOFRAN) 8 MG tablet Take 1 tablet (8 mg total) by mouth 2 (two) times daily as needed for refractory nausea / vomiting. Start on day 3 after chemotherapy. 30 tablet 1  . prochlorperazine (COMPAZINE) 10 MG tablet Take 1 tablet (10 mg total) by mouth every 6 (six) hours as needed (Nausea or vomiting). 30 tablet 1   No current facility-administered medications for this visit.  REVIEW OF SYSTEMS:   Constitutional: Denies fevers, chills or abnormal night sweats Eyes: Denies blurriness of vision, double vision or watery eyes Ears, nose, mouth, throat, and face: Denies mucositis or sore throat Respiratory: Denies cough, dyspnea or wheezes Cardiovascular: Denies palpitation, chest discomfort or lower extremity swelling Gastrointestinal:  Denies nausea, heartburn or change in bowel habits Skin: Denies abnormal skin rashes Lymphatics: Denies new lymphadenopathy or easy bruising Neurological:Denies numbness, tingling or new weaknesses Behavioral/Psych: Mood is stable, no new changes  All other systems were reviewed with the patient and are negative.  PHYSICAL EXAMINATION: ECOG PERFORMANCE STATUS: 1 - Symptomatic but completely ambulatory  Vitals with BMI 03/08/2016  Height 5' 7"   Weight 185 lbs 3 oz  BMI 26.7  Systolic 124  Diastolic 58  Pulse 79  Respirations 18   Blood pressure 135/58, heart rate 79, respiratory rate 18, temperature 98.3, pulse ox 96% on room air GENERAL:alert, no distress and comfortable  SKIN: skin color, texture, turgor are normal, no rashes or significant lesions EYES: normal, conjunctiva  are pink and non-injected, sclera clear OROPHARYNX:no exudate, no erythema and lips, buccal mucosa, and tongue normal  NECK: supple, thyroid normal size, non-tender, without nodularity LYMPH:  no palpable lymphadenopathy in the cervical, axillary or inguinal LUNGS: clear to auscultation and percussion with normal breathing effort HEART: regular rate & rhythm and no murmurs and no lower extremity edema ABDOMEN:abdomen soft, non-tender and normal bowel sounds, midline incision has healed well, no discharge or skin erythema.  Musculoskeletal:no cyanosis of digits and no clubbing  PSYCH: alert & oriented x 3 with fluent speech NEURO: no focal motor/sensory deficits  LABORATORY DATA:  I have reviewed the data as listed CBC Latest Ref Rng & Units 03/08/2016 02/12/2016 01/29/2016  WBC 3.9 - 10.3 10e3/uL 7.0 7.3 9.4  Hemoglobin 11.6 - 15.9 g/dL 13.4 12.7 12.6  Hematocrit 34.8 - 46.6 % 41.3 39.5 39.7  Platelets 145 - 400 10e3/uL 169 261 178   CMP Latest Ref Rng & Units 02/12/2016 01/29/2016 01/25/2016  Glucose 70 - 140 mg/dl 115 135(H) 100(H)  BUN 7.0 - 26.0 mg/dL 13.5 <5(L) 14  Creatinine 0.6 - 1.1 mg/dL 0.8 0.62 0.60  Sodium 136 - 145 mEq/L 142 140 140  Potassium 3.5 - 5.1 mEq/L 4.1 3.6 4.5  Chloride 101 - 111 mmol/L - 106 108  CO2 22 - 29 mEq/L 25 27 24   Calcium 8.4 - 10.4 mg/dL 9.5 8.5(L) 9.7  Total Protein 6.4 - 8.3 g/dL 6.6 - -  Total Bilirubin 0.20 - 1.20 mg/dL 0.38 - -  Alkaline Phos 40 - 150 U/L 270(H) - -  AST 5 - 34 U/L 16 - -  ALT 0 - 55 U/L 30 - -    PATHOLOGY REPORT  Diagnosis 01/04/2016 Colon, biopsy, rectosigmoid junction - ADENOCARCINOMA, SEE COMMENT. Microscopic Comment Dr. Saralyn Pilar reviewed this case and concurs. The results were discussed with Dr. Watt Climes on 01/05/16. (BNS:gt, 01/05/16)  Diagnosis 01/28/2016 1. Soft tissue, biopsy, Implant on sigmoid colon - BENIGN CALCIFIED NODULE. - NO EVIDENCE OF MALIGNANCY. 2. Colon, segmental resection for tumor, Recto-sigmoid -  INVASIVE COLONIC ADENOCARCINOMA, 4.5 CM EXTENDING INTO PERICOLONIC CONNECTIVE TISSUE. - LYMPHATIC VASCULAR INVOLVEMENT BY TUMOR. - MARGINS NOT INVOLVED. - METASTATIC CARCINOMA IN 9 OF 21 LYMPH NODES (9/21). - SEE ONCOLOGY TABLE BELOW. 3. Colon, resection margin (donut), Proximal and distal of sigmoid - BENIGN PROXIMAL AND DISTAL ANASTOMATIC RINGS. - NO EVIDENCE OF MALIGNANCY. Microscopic Comment 2. COLON AND RECTUM (INCLUDING TRANS-ANAL RESECTION): Specimen: Sigmoid colon.  Procedure: Segmental resection. Tumor site: Rectosigmoid. Specimen integrity: Intact. Macroscopic intactness of mesorectum: Near complete. Macroscopic tumor perforation: No. Invasive tumor: Maximum size: 4.5 cm. Histologic type(s): Colorectal adenocarcinoma. Histologic grade and differentiation: G2: moderately differentiated/low grade. Type of polyp in which invasive carcinoma arose: No residual polyp. Microscopic extension of invasive tumor: Focally into pericolonic connective tissue. Lymph-Vascular invasion: Present. Peri-neural invasion: No. Tumor deposit(s) (discontinuous extramural extension): No. Resection margins: Proximal margin: Free of tumor. Distal margin: Free of tumor. Circumferential (radial) (posterior ascending, posterior descending; lateral and posterior mid-rectum; and entire lower 1/3 rectum): Free of tumor. Mesenteric margin (sigmoid and transverse): Free of tumor. Distance closest margin (if all above margins negative): 1.5 cm from distal margin. Treatment effect (neo-adjuvant therapy): No. Additional polyp(s): No. Non-neoplastic findings: Benign diverticulum. Lymph nodes: number examined 21; number positive: 9. Pathologic Staging: pT3, pN2b, pMX. Ancillary studies: Microsatellite instability by PCR and mismatch repair protein by immunohistochemistry pending. (JDP:ds 02/01/16) Claudette Laws MD Pathologist, Electronic Signature (Case signed 02/01/2016) Specimen Gross and Clinical  Information  RADIOGRAPHIC STUDIES: I have personally reviewed the radiological images as listed and agreed with the findings in the report. Ct Chest Wo Contrast  Result Date: 02/24/2016 CLINICAL DATA:  Colon cancer diagnosed August 2017. History of surgical resection. EXAM: CT CHEST WITHOUT CONTRAST TECHNIQUE: Multidetector CT imaging of the chest was performed following the standard protocol without IV contrast. COMPARISON:  CT 01/05/2016 MRI 11/06/2015 FINDINGS: Cardiovascular: Coronary artery calcification and aortic atherosclerotic calcification. central venous line noted Mediastinum/Nodes: No axillary or supraclavicular lymphadenopathy. No mediastinal hilar adenopathy. Esophagus normal. Lungs/Pleura: There is atelectasis at the lung bases. Upper Abdomen: Large round lesion in the LEFT hepatic lobe measuring 4.6 cm is again demonstrated and characterized as a hemangioma on comparison MRI. Musculoskeletal: No aggressive osseous lesion. IMPRESSION: 1. No evidence of pulmonary metastasis. 2. Stable large hepatic hemangioma. Electronically Signed   By: Suzy Bouchard M.D.   On: 02/24/2016 14:04   Dg Chest Port 1 View  Result Date: 02/18/2016 CLINICAL DATA:  Status post Port-A-Cath placement. EXAM: PORTABLE CHEST 1 VIEW COMPARISON:  None. FINDINGS: Cardiomegaly is noted. Left subclavian Port-A-Cath is noted with distal tip in expected position of the SVC. No pneumothorax or pleural effusion is noted. Mild bibasilar subsegmental atelectasis is noted. Postsurgical changes are seen in the left humeral head. IMPRESSION: No pneumothorax status post left subclavian Port-A-Cath placement. Electronically Signed   By: Marijo Conception, M.D.   On: 02/18/2016 15:35   Dg Fluoro Guide Cv Line-no Report  Result Date: 02/18/2016 CLINICAL DATA:  FLOURO GUIDE CV LINE Fluoroscopy was utilized by the requesting physician.  No radiographic interpretation.   CT abdomen and pelvis with contrast  01/05/2016 IMPRESSION: 1. No dominant sigmoid colon mass identified. There is underdistention and soft tissue fullness at the rectosigmoid junction, suspicious for the primary lesion. Borderline enlarged nodes in the sigmoid mesocolon and at the origin of the inferior mesenteric artery are indeterminate. As these are in the drainage pathway for a sigmoid primary, localized nodal metastasis cannot be excluded. 2. No evidence of distant metastatic disease. 3.  Aortic atherosclerosis. 4. Uterine fibroid. 5. Left hepatic lobe giant hemangioma, as before.  CT chest 02/24/2016 IMPRESSION: 1. No evidence of pulmonary metastasis. 2. Stable large hepatic hemangioma.   Colonoscopy 01/04/2016 Dr. Watt Climes Findings: An ulcerated partially obstructing large mass was found in the rectosigmoid colon. The mass was partially circumferential, measured about 5 cm in length. Biopsy was taken. Multiple small milestone diverticula were found in the sigmoid  colon. The exam otherwise was normal.   ASSESSMENT & PLAN:  65 year old Caucasian female, with recently diagnosed colorectal cancer   1. Colorectal cancer, invasive adenocarcinoma,pT3N2bM0, stage IIIC, MSI-stable  -Her primary cancer is located in the rectosigmoid colon. -I reviewed her lab findings with patient and her husband. -She has had complete surgical resection -I'll obtain a CT chest without contrast to complete her staging -Giving the multiple positive lymph nodes, and locally advanced disease, she is at high risk for recurrence. -I recommend adjuvant chemotherapy to reduce her risk of recurrence, treated options of FOLFOX and CAPEOX were discussed with pt and her husband -Based on the recently reported data, 3 months oxaliplatin-based adjuvant chemotherapy has similar outcome as 6 months imminent. However due to her multiple positive lymph nodes, I will recommend 6 months therapy if she can tolerate. If she does have significant side effects,  I'll back off her chemotherapy or even stop after 3 months of therapy.   -Lab reviewed, adequate for treatment. She would like to proceed with Capeox treatment -She had chemotherapy teaching today.  -She will begin Capeox Cycle 1 chemo today -Advised her to take compazine for potential nausea with treatment. She indicated understanding.  -Advised her to look out for cold sensitivity for treatment.  2. 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 This is considered a benign breast lesion, not high risk for breast cancer. -I used the Baker Janus model to calculate her risk of developing breast cancer, Based on her age, family history, pregnancy history, etc, her risk of developing breast cancer is 3.3% in 5 years and 11.7% in her lifetime. This is consider moderate risk. -We reviewed the risk factors for developing breast cancer, and strategies to prevent breast cancer, especially healthy diet, exercise, and maintain ideal weight.  -Chemoprevention with tamoxifen or anastrozole was discussed with her before, and she declined. -We finally discussed the breast cancer screening, including yearly screening mammogram with 3-D technology, self-exam every months and physician exam breast exam once a year.    Plan -First cycle capeox today -Follow up in 1 week with labs for reassessment after first treatment -If she tolerates treatment well then she will proceed with a 3 week follow up schedule  All questions were answered. The patient knows to call the clinic with any problems, questions or concerns.  I spent 20 minutes counseling the patient face to face. The total time spent in the appointment was 25 minutes and more than 50% was on counseling.  This document serves as a record of services personally performed by Truitt Merle, MD. It was created on her behalf by Arlyce Harman, a trained medical scribe. The  creation of this record is based on the scribe's personal observations and the provider's statements to them. This document has been checked and approved by the attending provider.   Truitt Merle  03/08/2016

## 2016-03-09 ENCOUNTER — Telehealth: Payer: Self-pay | Admitting: *Deleted

## 2016-03-09 NOTE — Telephone Encounter (Signed)
Per LOS I have tried to schedule appts, MD visit to late to start treatment. Waiting on scheduler to advie

## 2016-03-14 ENCOUNTER — Telehealth: Payer: Self-pay | Admitting: *Deleted

## 2016-03-14 NOTE — Telephone Encounter (Signed)
Per LOS I have scheduled appt and notified the scheduler 

## 2016-03-15 ENCOUNTER — Ambulatory Visit (HOSPITAL_BASED_OUTPATIENT_CLINIC_OR_DEPARTMENT_OTHER): Payer: Federal, State, Local not specified - PPO | Admitting: Hematology

## 2016-03-15 ENCOUNTER — Other Ambulatory Visit (HOSPITAL_BASED_OUTPATIENT_CLINIC_OR_DEPARTMENT_OTHER): Payer: Federal, State, Local not specified - PPO

## 2016-03-15 ENCOUNTER — Ambulatory Visit: Payer: Federal, State, Local not specified - PPO

## 2016-03-15 ENCOUNTER — Encounter: Payer: Self-pay | Admitting: Hematology

## 2016-03-15 VITALS — BP 140/76 | HR 63 | Temp 97.8°F | Resp 18 | Ht 67.0 in | Wt 183.0 lb

## 2016-03-15 DIAGNOSIS — Z95828 Presence of other vascular implants and grafts: Secondary | ICD-10-CM | POA: Insufficient documentation

## 2016-03-15 DIAGNOSIS — C186 Malignant neoplasm of descending colon: Secondary | ICD-10-CM

## 2016-03-15 DIAGNOSIS — C19 Malignant neoplasm of rectosigmoid junction: Secondary | ICD-10-CM

## 2016-03-15 DIAGNOSIS — N6022 Fibroadenosis of left breast: Secondary | ICD-10-CM | POA: Diagnosis not present

## 2016-03-15 DIAGNOSIS — C20 Malignant neoplasm of rectum: Secondary | ICD-10-CM

## 2016-03-15 LAB — CBC WITH DIFFERENTIAL/PLATELET
BASO%: 0.3 % (ref 0.0–2.0)
Basophils Absolute: 0 10*3/uL (ref 0.0–0.1)
EOS ABS: 0.2 10*3/uL (ref 0.0–0.5)
EOS%: 3.9 % (ref 0.0–7.0)
HEMATOCRIT: 39.4 % (ref 34.8–46.6)
HEMOGLOBIN: 12.9 g/dL (ref 11.6–15.9)
LYMPH#: 1.6 10*3/uL (ref 0.9–3.3)
LYMPH%: 30.4 % (ref 14.0–49.7)
MCH: 28.2 pg (ref 25.1–34.0)
MCHC: 32.7 g/dL (ref 31.5–36.0)
MCV: 86.2 fL (ref 79.5–101.0)
MONO#: 0.5 10*3/uL (ref 0.1–0.9)
MONO%: 8.5 % (ref 0.0–14.0)
NEUT%: 56.9 % (ref 38.4–76.8)
NEUTROS ABS: 3.1 10*3/uL (ref 1.5–6.5)
PLATELETS: 168 10*3/uL (ref 145–400)
RBC: 4.57 10*6/uL (ref 3.70–5.45)
RDW: 13.8 % (ref 11.2–14.5)
WBC: 5.4 10*3/uL (ref 3.9–10.3)

## 2016-03-15 LAB — COMPREHENSIVE METABOLIC PANEL
ALBUMIN: 3.6 g/dL (ref 3.5–5.0)
ALK PHOS: 177 U/L — AB (ref 40–150)
ALT: 22 U/L (ref 0–55)
ANION GAP: 10 meq/L (ref 3–11)
AST: 17 U/L (ref 5–34)
BILIRUBIN TOTAL: 0.42 mg/dL (ref 0.20–1.20)
BUN: 11.4 mg/dL (ref 7.0–26.0)
CALCIUM: 9.4 mg/dL (ref 8.4–10.4)
CO2: 24 mEq/L (ref 22–29)
CREATININE: 0.6 mg/dL (ref 0.6–1.1)
Chloride: 106 mEq/L (ref 98–109)
Glucose: 100 mg/dl (ref 70–140)
Potassium: 4.3 mEq/L (ref 3.5–5.1)
Sodium: 140 mEq/L (ref 136–145)
TOTAL PROTEIN: 6.5 g/dL (ref 6.4–8.3)

## 2016-03-15 MED ORDER — SODIUM CHLORIDE 0.9% FLUSH
10.0000 mL | INTRAVENOUS | Status: DC | PRN
  Administered 2016-03-15: 10 mL via INTRAVENOUS
  Filled 2016-03-15: qty 10

## 2016-03-15 MED ORDER — HEPARIN SOD (PORK) LOCK FLUSH 100 UNIT/ML IV SOLN
500.0000 [IU] | Freq: Once | INTRAVENOUS | Status: AC | PRN
  Administered 2016-03-15: 500 [IU] via INTRAVENOUS
  Filled 2016-03-15: qty 5

## 2016-03-15 NOTE — Progress Notes (Signed)
[place] [place]  [phone] [phone]  Clinic Follow up Note   Patient Care Team: [name], MD as PCP - General (Family Medicine) Autumn Messing III, MD as Consulting Physician (General Surgery) 03/15/2016   CHIEF COMPLAINTS:  Recently diagnosed stage III colon cancer   Oncology History   Rectal cancer [facility])   Staging form: Colon and Rectum, AJCC 7th Edition   - Pathologic stage from 01/28/2016: Stage IIIC (T3, N2b, cM0) - Signed by Truitt Merle, MD on 02/12/2016      Cancer of left colon ([place])   01/04/2016 Procedure    Colonoscopy by Dr. Estanislado Emms showed a partially obstructing tumor in the rectal sigmoid colon, biopsied.      01/05/2016 Imaging    CT abdomen and pelvis with contrast showed no dominant sigmoid colon mass, soft tissue fullness at the rectosigmoid junction, suspicious for the primary lesion. Borderline enlarged nodes in the sigmoid mesocolon and at the origin of the inferior mesenteric artery are indeterminate no evidence of December metastasis. Left hepatic lobe giant hemangioma.       01/28/2016 Initial Diagnosis    Rectal cancer ([place])     01/28/2016 Surgery    Endoscopic low anterior resection of rectosigmoid segmental colon for tumor       01/28/2016 Pathology Results    Invasive colonic adenocarcinoma, 4.5 cm extending into her chronic connective tissue, lymphovascular involvement by tumor, margins were negative, metastatic carcinoma in 9 of 21 lymph nodes.       HISTORY OF PRESENTING ILLNESS (05/14/2015):  Bethany Frederick 65 y.o. female is here to discussed her risk of breast cancer.   She has had palpable left breast mass for over 20 years, unchanged. Her mammogram in 2014 showed a 0.8 cm cyst. She does screening mammogram once a year. The lesion looks slightly more suspicious on the mammogram in August 2016, and a biopsy was recommended, which showed sclerosing lesion with typical ductal hyperplasia. She was referred to see Dr.  Marlou Starks and underwent left breast lumpectomy. She tolerated the surgery very well, and has recovered completely.  She feels well, has mild fatigue, she takes care two of her brothers, who live alone and have health issues. She lives with her hsuband and 47 yo son. She remains physically active, does not exercise regularly. She has mild arthritis, takes ibuprofen and Aleve as needed, no other complaints.  GYN HISTORY  Menarchal: 10 LMP: 32 Contraceptive: 23 years  HRT: 5 years, stopped at age of 15  G1P1: one son, she was 1 yo when she had her son   CURRENT THERAPY: Adjuvant chemotherapy with CAPOX (Xeloda 2000 mg twice daily, on day 1-14, oxaliplatin 183m/m2 on day 1) every 3 weeks, started 03/08/2016  [place] after her first cycle chemotherapy last week. She is accompanied by her son to the clinic today. She tolerated the treatment very well last week, no significant nausea, diarrhea, or other complaints. Mild fatigue, she functions better while at home. She has been eating small meals since her bowel surgery, no significant change since last week. No fever or chills.   MEDICAL HISTORY:  Past Medical History:  Diagnosis Date  . Arthritis    back, hip, feet  . Cancer ([place]    Sigmoid colon  . Cataract, immature   . GERD (gastroesophageal reflux disease)   . Heart palpitations   . 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 Seasonal allergies   .  Wears partial dentures    upper front - 1 tooth    SURGICAL HISTORY: Past Surgical History:  Procedure Laterality Date  . APPENDECTOMY    . BREAST LUMPECTOMY WITH RADIOACTIVE SEED LOCALIZATION Left 03/13/2015   Procedure: BREAST LUMPECTOMY WITH RADIOACTIVE SEED LOCALIZATION;  Surgeon: Autumn Messing III, MD;  Location: [place];  Service: General;  Laterality: Left;  . COLONOSCOPY    . ENDOMETRIAL FULGURATION  06/13/2000  . INGUINAL HERNIA REPAIR  03/05/2012    Procedure: HERNIA REPAIR INGUINAL INCARCERATED;  Surgeon: Ralene Ok, MD;  Location: [place];  Service: General;  Laterality: N/A;  . INSERTION OF MESH  03/05/2012   Procedure: INSERTION OF MESH;  Surgeon: Ralene Ok, MD;  Location: [place];  Service: General;  Laterality: Left;  . LAPAROSCOPIC SIGMOID COLECTOMY Bilateral 01/28/2016   Procedure: LAPAROSCOPIC ASSISTED SIGMOID COLECTOMY;  Surgeon: Autumn Messing III, MD;  Location: [place];  Service: General;  Laterality: Bilateral;  . MYOMECTOMY  06/13/2000  . PORTACATH PLACEMENT N/A 02/18/2016   Procedure: INSERTION PORT-A-CATH;  Surgeon: Autumn Messing III, MD;  Location: [place];  Service: General;  Laterality: N/A;  . ROTATOR CUFF REPAIR Left    2013    SOCIAL HISTORY: Social History   Social History  . Marital status: Married    Spouse name: N/A  . Number of children: N/A  . Years of education: N/A   Occupational History  . Not on file.   Social History Main Topics  . Smoking status: Never Smoker  . Smokeless tobacco: Never Used  . Alcohol use Yes     Comment: occasionally  . Drug use: No  . Sexual activity: Not Currently   Other Topics Concern  . Not on file   Social History Narrative  . No narrative on file    FAMILY HISTORY: Family History  Problem Relation Age of Onset  . Cancer Brother 60    prostate cancer   . Cancer Paternal Aunt 73    breast cancer   . Cancer Cousin 40    ovarian and breast cancer (maternal cousin)     ALLERGIES:  is allergic to no known allergies.  MEDICATIONS:  Current Outpatient Prescriptions  Medication Sig Dispense Refill  . capecitabine (XELODA) 500 MG tablet Take 4 tablets (2,000 mg total) by mouth 2 (two) times daily after a meal. Take on days 1-14 of chemotherapy. 112 tablet 3  . clobetasol cream (TEMOVATE) 4.17 % Apply 1 application topically daily as needed (RASH).     . famotidine (PEPCID) 20 MG tablet Take 20 mg by mouth 2 (two) times daily as needed for  heartburn or indigestion.   0  . lidocaine-prilocaine (EMLA) cream Apply 1 application topically as needed. Apply to port site 1 hour prior to stick and cover w/plastic wrap 30 g 11  . HYDROcodone-acetaminophen (NORCO/VICODIN) 5-325 MG tablet Take 1-2 tablets by mouth every 4 (four) hours as needed for moderate pain or severe pain. (Patient not taking: Reported on 03/15/2016) 20 tablet 0  . ondansetron (ZOFRAN) 8 MG tablet Take 1 tablet (8 mg total) by mouth 2 (two) times daily as needed for refractory nausea / vomiting. Start on day 3 after chemotherapy. (Patient not taking: Reported on 03/15/2016) 30 tablet 1  . prochlorperazine (COMPAZINE) 10 MG tablet Take 1 tablet (10 mg total) by mouth every 6 (six) hours as needed (Nausea or vomiting). (Patient not taking: Reported on 03/15/2016) 30 tablet 1   No current facility-administered medications  for this visit.     REVIEW OF SYSTEMS:   Constitutional: Denies fevers, chills or abnormal night sweats Eyes: Denies blurriness of vision, double vision or watery eyes Ears, nose, mouth, throat, and face: Denies mucositis or sore throat Respiratory: Denies cough, dyspnea or wheezes Cardiovascular: Denies palpitation, chest discomfort or lower extremity swelling Gastrointestinal:  Denies nausea, heartburn or change in bowel habits Skin: Denies abnormal skin rashes Lymphatics: Denies new lymphadenopathy or easy bruising Neurological:Denies numbness, tingling or new weaknesses Behavioral/Psych: Mood is stable, no new changes  All other systems were reviewed with the patient and are negative.  PHYSICAL EXAMINATION: ECOG PERFORMANCE STATUS: 0 BP 140/76 (BP Location: Right Arm, Patient Position: Sitting)   Pulse 63   Temp 97.8 F (36.6 C) (Oral)   Resp 18   Ht _0  (1.702 m)   Wt 183 lb (83 kg)   SpO2 100%   BMI 28.66 kg/m   GENERAL:alert, no distress and comfortable  SKIN: skin color, texture, turgor are normal, no rashes or significant  lesions EYES: normal, conjunctiva are pink and non-injected, sclera clear OROPHARYNX:no exudate, no erythema and lips, buccal mucosa, and tongue normal  NECK: supple, thyroid normal size, non-tender, without nodularity LYMPH:  no palpable lymphadenopathy in the cervical, axillary or inguinal LUNGS: clear to auscultation and percussion with normal breathing effort HEART: regular rate & rhythm and no murmurs and no lower extremity edema ABDOMEN:abdomen soft, non-tender and normal bowel sounds, midline incision has healed well, no discharge or skin erythema.  Musculoskeletal:no cyanosis of digits and no clubbing  PSYCH: alert & oriented x 3 with fluent speech NEURO: no focal motor/sensory deficits  LABORATORY DATA:  I have reviewed the data as listed CBC Latest Ref Rng & Units 03/15/2016 03/08/2016 02/12/2016  WBC 3.9 - 10.3 10e3/uL 5.4 7.0 7.3  Hemoglobin 11.6 - 15.9 g/dL 12.9 13.4 12.7  Hematocrit 34.8 - 46.6 % 39.4 41.3 39.5  Platelets 145 - 400 10e3/uL 168 169 261   CMP Latest Ref Rng & Units 03/15/2016 03/08/2016 02/12/2016  Glucose 70 - 140 mg/dl 100 108 115  BUN 7.0 - 26.0 mg/dL 11.4 18.4 13.5  Creatinine 0.6 - 1.1 mg/dL 0.6 0.7 0.8  Sodium 136 - 145 mEq/L 140 141 142  Potassium 3.5 - 5.1 mEq/L 4.3 4.2 4.1  Chloride 101 - 111 mmol/L - - -  CO2 22 - 29 mEq/L _1 Calcium 8.4 - 10.4 mg/dL 9.4 9.7 9.5  Total Protein 6.4 - 8.3 g/dL 6.5 6.8 6.6  Total Bilirubin 0.20 - 1.20 mg/dL 0.42 0.44 0.38  Alkaline Phos 40 - 150 U/L 177(H) 236(H) 270(H)  AST 5 - 34 U/L _2 ALT 0 - 55 U/L _3 PATHOLOGY REPORT  Diagnosis 01/04/2016 Colon, biopsy, rectosigmoid junction - ADENOCARCINOMA, SEE COMMENT. Microscopic Comment Dr. Saralyn Pilar reviewed this case and concurs. The results were discussed with Dr. Watt Climes on 01/05/16. (BNS:gt, 01/05/16)  Diagnosis 01/28/2016 1. Soft tissue, biopsy, Implant on sigmoid colon - BENIGN CALCIFIED NODULE. - NO EVIDENCE OF MALIGNANCY. 2. Colon,  segmental resection for tumor, Recto-sigmoid - INVASIVE COLONIC ADENOCARCINOMA, 4.5 CM EXTENDING INTO PERICOLONIC CONNECTIVE TISSUE. - LYMPHATIC VASCULAR INVOLVEMENT BY TUMOR. - MARGINS NOT INVOLVED. - METASTATIC CARCINOMA IN 9 OF 21 LYMPH NODES (9/21). - SEE ONCOLOGY TABLE BELOW. 3. Colon, resection margin (donut), Proximal and distal of sigmoid - BENIGN PROXIMAL AND DISTAL ANASTOMATIC RINGS. - NO EVIDENCE OF MALIGNANCY. Microscopic Comment 2. COLON AND RECTUM (INCLUDING TRANS-ANAL RESECTION):  Specimen: Sigmoid colon. Procedure: Segmental resection. Tumor site: Rectosigmoid. Specimen integrity: Intact. Macroscopic intactness of mesorectum: Near complete. Macroscopic tumor perforation: No. Invasive tumor: Maximum size: 4.5 cm. Histologic type(s): Colorectal adenocarcinoma. Histologic grade and differentiation: G2: moderately differentiated/low grade. Type of polyp in which invasive carcinoma arose: No residual polyp. Microscopic extension of invasive tumor: Focally into pericolonic connective tissue. Lymph-Vascular invasion: Present. Peri-neural invasion: No. Tumor deposit(s) (discontinuous extramural extension): No. Resection margins: Proximal margin: Free of tumor. Distal margin: Free of tumor. Circumferential (radial) (posterior ascending, posterior descending; lateral and posterior mid-rectum; and entire lower 1/3 rectum): Free of tumor. Mesenteric margin (sigmoid and transverse): Free of tumor. Distance closest margin (if all above margins negative): 1.5 cm from distal margin. Treatment effect (neo-adjuvant therapy): No. Additional polyp(s): No. Non-neoplastic findings: Benign diverticulum. Lymph nodes: number examined 21; number positive: 9. Pathologic Staging: pT3, pN2b, pMX. Ancillary studies: Microsatellite instability by PCR and mismatch repair protein by immunohistochemistry pending. (JDP:ds 02/01/16) Jimmy Picket MD Pathologist, Electronic Signature (Case signed  02/01/2016) Specimen Gross and Clinical Information  RADIOGRAPHIC STUDIES: I have personally reviewed the radiological images as listed and agreed with the findings in the report. Ct Chest Wo Contrast  Result Date: 02/24/2016 CLINICAL DATA:  Colon cancer diagnosed August 2017. History of surgical resection. EXAM: CT CHEST WITHOUT CONTRAST TECHNIQUE: Multidetector CT imaging of the chest was performed following the standard protocol without IV contrast. COMPARISON:  CT 01/05/2016 MRI 11/06/2015 FINDINGS: Cardiovascular: Coronary artery calcification and aortic atherosclerotic calcification. central venous line noted Mediastinum/Nodes: No axillary or supraclavicular lymphadenopathy. No mediastinal hilar adenopathy. Esophagus normal. Lungs/Pleura: There is atelectasis at the lung bases. Upper Abdomen: Large round lesion in the LEFT hepatic lobe measuring 4.6 cm is again demonstrated and characterized as a hemangioma on comparison MRI. Musculoskeletal: No aggressive osseous lesion. IMPRESSION: 1. No evidence of pulmonary metastasis. 2. Stable large hepatic hemangioma. Electronically Signed   By: Genevive Bi M.D.   On: 02/24/2016 14:04   Dg Chest Port 1 View  Result Date: 02/18/2016 CLINICAL DATA:  Status post Port-A-Cath placement. EXAM: PORTABLE CHEST 1 VIEW COMPARISON:  None. FINDINGS: Cardiomegaly is noted. Left subclavian Port-A-Cath is noted with distal tip in expected position of the SVC. No pneumothorax or pleural effusion is noted. Mild bibasilar subsegmental atelectasis is noted. Postsurgical changes are seen in the left humeral head. IMPRESSION: No pneumothorax status post left subclavian Port-A-Cath placement. Electronically Signed   By: Lupita Raider, M.D.   On: 02/18/2016 15:35   Dg Fluoro Guide Cv Line-no Report  Result Date: 02/18/2016 CLINICAL DATA:  FLOURO GUIDE CV LINE Fluoroscopy was utilized by the requesting physician.  No radiographic interpretation.   CT abdomen and  pelvis with contrast 01/05/2016 IMPRESSION: 1. No dominant sigmoid colon mass identified. There is underdistention and soft tissue fullness at the rectosigmoid junction, suspicious for the primary lesion. Borderline enlarged nodes in the sigmoid mesocolon and at the origin of the inferior mesenteric artery are indeterminate. As these are in the drainage pathway for a sigmoid primary, localized nodal metastasis cannot be excluded. 2. No evidence of distant metastatic disease. 3.  Aortic atherosclerosis. 4. Uterine fibroid. 5. Left hepatic lobe giant hemangioma, as before.  CT chest 02/24/2016 IMPRESSION: 1. No evidence of pulmonary metastasis. 2. Stable large hepatic hemangioma.   Colonoscopy 01/04/2016 Dr. Ewing Schlein Findings: An ulcerated partially obstructing large mass was found in the rectosigmoid colon. The mass was partially circumferential, measured about 5 cm in length. Biopsy was taken. Multiple small milestone diverticula were found  in the sigmoid colon. The exam otherwise was normal.   ASSESSMENT & PLAN:  65 year old Caucasian female, with recently diagnosed colorectal cancer   1. Colorectal cancer, invasive adenocarcinoma,pT3N2bM0, stage IIIC, MSI-stable  -Her primary cancer is located in the rectosigmoid colon. -I reviewed her lab findings with patient and her husband. -She has had complete surgical resection -CT scan was negative for distant metastasis. -Giving the multiple positive lymph nodes, and locally advanced disease, she is at high risk for recurrence. -I recommend adjuvant chemotherapy to reduce her risk of recurrence, treated options of FOLFOX and CAPEOX were discussed with pt and her husband -Based on the recently reported data, 3 months oxaliplatin-based adjuvant chemotherapy has similar outcome as 6 months imminent. However due to her multiple positive lymph nodes, I will recommend 6 months therapy if she can tolerate. If she does have significant side  effects, I'll back off her chemotherapy or even stop after 3 months of therapy. -She tolerated the first cycle chemotherapy well last week, will continue Xeloda -Lab results reviewed with patient, CBC and CMP are within normal limits -Return to clinic in 2 weeks to start cycle 2.  2. 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 This is considered a benign breast lesion, not high risk for breast cancer. -I used the Baker Janus model to calculate her risk of developing breast cancer, Based on her age, family history, pregnancy history, etc, her risk of developing breast cancer is 3.3% in 5 years and 11.7% in her lifetime. This is consider moderate risk. -We reviewed the risk factors for developing breast cancer, and strategies to prevent breast cancer, especially healthy diet, exercise, and maintain ideal weight.  -Chemoprevention with tamoxifen or anastrozole was discussed with her before, and she declined. -We finally discussed the breast cancer screening, including yearly screening mammogram with 3-D technology, self-exam every months and physician exam breast exam once a year.    Plan -Return to clinic in 2 weeks as is scheduled for cycle 2 chemotherapy CAPOX   All questions were answered. The patient knows to call the clinic with any problems, questions or concerns.  I spent 10 minutes counseling the patient face to face. The total time spent in the appointment was 15 minutes and more than 50% was on counseling.  Truitt Merle  03/15/2016

## 2016-03-16 LAB — GAMMA GT: GGT: 163 IU/L — ABNORMAL HIGH (ref 0–60)

## 2016-03-29 ENCOUNTER — Other Ambulatory Visit (HOSPITAL_BASED_OUTPATIENT_CLINIC_OR_DEPARTMENT_OTHER): Payer: Federal, State, Local not specified - PPO

## 2016-03-29 ENCOUNTER — Ambulatory Visit (HOSPITAL_BASED_OUTPATIENT_CLINIC_OR_DEPARTMENT_OTHER): Payer: Federal, State, Local not specified - PPO

## 2016-03-29 ENCOUNTER — Encounter: Payer: Self-pay | Admitting: *Deleted

## 2016-03-29 ENCOUNTER — Ambulatory Visit (HOSPITAL_BASED_OUTPATIENT_CLINIC_OR_DEPARTMENT_OTHER): Payer: Federal, State, Local not specified - PPO | Admitting: Hematology

## 2016-03-29 ENCOUNTER — Encounter: Payer: Self-pay | Admitting: Hematology

## 2016-03-29 ENCOUNTER — Telehealth: Payer: Self-pay | Admitting: *Deleted

## 2016-03-29 ENCOUNTER — Telehealth: Payer: Self-pay | Admitting: Hematology

## 2016-03-29 ENCOUNTER — Ambulatory Visit: Payer: Federal, State, Local not specified - PPO

## 2016-03-29 VITALS — BP 124/74 | HR 70 | Temp 98.0°F | Resp 17 | Ht 67.0 in | Wt 184.3 lb

## 2016-03-29 DIAGNOSIS — N6092 Unspecified benign mammary dysplasia of left breast: Secondary | ICD-10-CM | POA: Diagnosis not present

## 2016-03-29 DIAGNOSIS — C799 Secondary malignant neoplasm of unspecified site: Secondary | ICD-10-CM

## 2016-03-29 DIAGNOSIS — Z95828 Presence of other vascular implants and grafts: Secondary | ICD-10-CM

## 2016-03-29 DIAGNOSIS — C186 Malignant neoplasm of descending colon: Secondary | ICD-10-CM

## 2016-03-29 DIAGNOSIS — C779 Secondary and unspecified malignant neoplasm of lymph node, unspecified: Secondary | ICD-10-CM

## 2016-03-29 DIAGNOSIS — C19 Malignant neoplasm of rectosigmoid junction: Secondary | ICD-10-CM | POA: Diagnosis not present

## 2016-03-29 DIAGNOSIS — Z5111 Encounter for antineoplastic chemotherapy: Secondary | ICD-10-CM

## 2016-03-29 DIAGNOSIS — C20 Malignant neoplasm of rectum: Secondary | ICD-10-CM

## 2016-03-29 LAB — CBC WITH DIFFERENTIAL/PLATELET
BASO%: 0.9 % (ref 0.0–2.0)
BASOS ABS: 0 10*3/uL (ref 0.0–0.1)
EOS%: 3 % (ref 0.0–7.0)
Eosinophils Absolute: 0.1 10*3/uL (ref 0.0–0.5)
HEMATOCRIT: 38.3 % (ref 34.8–46.6)
HGB: 12.7 g/dL (ref 11.6–15.9)
LYMPH#: 1.5 10*3/uL (ref 0.9–3.3)
LYMPH%: 34.2 % (ref 14.0–49.7)
MCH: 28.9 pg (ref 25.1–34.0)
MCHC: 33.2 g/dL (ref 31.5–36.0)
MCV: 87.2 fL (ref 79.5–101.0)
MONO#: 0.6 10*3/uL (ref 0.1–0.9)
MONO%: 14.1 % — AB (ref 0.0–14.0)
NEUT#: 2.1 10*3/uL (ref 1.5–6.5)
NEUT%: 47.8 % (ref 38.4–76.8)
Platelets: 208 10*3/uL (ref 145–400)
RBC: 4.39 10*6/uL (ref 3.70–5.45)
RDW: 15.5 % — ABNORMAL HIGH (ref 11.2–14.5)
WBC: 4.4 10*3/uL (ref 3.9–10.3)

## 2016-03-29 LAB — COMPREHENSIVE METABOLIC PANEL
ALT: 33 U/L (ref 0–55)
AST: 29 U/L (ref 5–34)
Albumin: 3.8 g/dL (ref 3.5–5.0)
Alkaline Phosphatase: 149 U/L (ref 40–150)
Anion Gap: 9 mEq/L (ref 3–11)
BUN: 12.8 mg/dL (ref 7.0–26.0)
CALCIUM: 9.6 mg/dL (ref 8.4–10.4)
CHLORIDE: 108 meq/L (ref 98–109)
CO2: 24 meq/L (ref 22–29)
CREATININE: 0.6 mg/dL (ref 0.6–1.1)
EGFR: >90 mL/min/{1.73_m2} (ref >90)
Glucose: 104 mg/dl (ref 70–140)
POTASSIUM: 4.2 meq/L (ref 3.5–5.1)
Sodium: 140 mEq/L (ref 136–145)
Total Bilirubin: 0.43 mg/dL (ref 0.20–1.20)
Total Protein: 6.5 g/dL (ref 6.4–8.3)

## 2016-03-29 LAB — CEA (IN HOUSE-CHCC): CEA (CHCC-In House): 2.02 ng/mL (ref 0.00–5.00)

## 2016-03-29 MED ORDER — HEPARIN SOD (PORK) LOCK FLUSH 100 UNIT/ML IV SOLN
500.0000 [IU] | Freq: Once | INTRAVENOUS | Status: AC | PRN
  Administered 2016-03-29: 500 [IU]
  Filled 2016-03-29: qty 5

## 2016-03-29 MED ORDER — PALONOSETRON HCL INJECTION 0.25 MG/5ML
INTRAVENOUS | Status: AC
  Filled 2016-03-29: qty 5

## 2016-03-29 MED ORDER — OXALIPLATIN CHEMO INJECTION 100 MG/20ML
126.0000 mg/m2 | Freq: Once | INTRAVENOUS | Status: AC
  Administered 2016-03-29: 250 mg via INTRAVENOUS
  Filled 2016-03-29: qty 40

## 2016-03-29 MED ORDER — DEXTROSE 5 % IV SOLN
Freq: Once | INTRAVENOUS | Status: AC
  Administered 2016-03-29: 14:00:00 via INTRAVENOUS

## 2016-03-29 MED ORDER — SODIUM CHLORIDE 0.9% FLUSH
10.0000 mL | INTRAVENOUS | Status: DC | PRN
  Administered 2016-03-29: 10 mL via INTRAVENOUS
  Filled 2016-03-29: qty 10

## 2016-03-29 MED ORDER — DEXAMETHASONE SODIUM PHOSPHATE 10 MG/ML IJ SOLN
INTRAMUSCULAR | Status: AC
  Filled 2016-03-29: qty 1

## 2016-03-29 MED ORDER — SODIUM CHLORIDE 0.9% FLUSH
10.0000 mL | INTRAVENOUS | Status: DC | PRN
  Administered 2016-03-29: 10 mL
  Filled 2016-03-29: qty 10

## 2016-03-29 MED ORDER — DEXAMETHASONE SODIUM PHOSPHATE 10 MG/ML IJ SOLN
10.0000 mg | Freq: Once | INTRAMUSCULAR | Status: AC
  Administered 2016-03-29: 10 mg via INTRAVENOUS

## 2016-03-29 MED ORDER — PALONOSETRON HCL INJECTION 0.25 MG/5ML
0.2500 mg | Freq: Once | INTRAVENOUS | Status: AC
  Administered 2016-03-29: 0.25 mg via INTRAVENOUS

## 2016-03-29 NOTE — Telephone Encounter (Signed)
Message sent to chemo scheduler to be added per 03/29/16 los. Appointments scheduled per 03/29/16 los. A copy of the  AVS report and appointment schedule was given to patient,per 03/29/16 los.

## 2016-03-29 NOTE — Progress Notes (Signed)
[place]  [phone]) [phone] Fax:([phone]) [phone]  Clinic Follow up Note   Patient Care Team: [name], MD as PCP - General (Family Medicine) Autumn Messing III, MD as Consulting Physician (General Surgery) 03/29/2016   CHIEF COMPLAINTS:  Recently diagnosed stage III colon cancer   Oncology History   Rectal cancer [facility])   Staging form: Colon and Rectum, AJCC 7th Edition   - Pathologic stage from 01/28/2016: Stage IIIC (T3, N2b, cM0) - Signed by Truitt Merle, MD on 02/12/2016      Cancer of left colon ([place])   01/04/2016 Procedure    Colonoscopy by Dr. Estanislado Emms showed a partially obstructing tumor in the rectal sigmoid colon, biopsied.      01/05/2016 Imaging    CT abdomen and pelvis with contrast showed no dominant sigmoid colon mass, soft tissue fullness at the rectosigmoid junction, suspicious for the primary lesion. Borderline enlarged nodes in the sigmoid mesocolon and at the origin of the inferior mesenteric artery are indeterminate no evidence of December metastasis. Left hepatic lobe giant hemangioma.       01/28/2016 Initial Diagnosis    Rectal cancer ([place])     01/28/2016 Surgery    Endoscopic low anterior resection of rectosigmoid segmental colon for tumor       01/28/2016 Pathology Results    Invasive colonic adenocarcinoma, 4.5 cm extending into her chronic connective tissue, lymphovascular involvement by tumor, margins were negative, metastatic carcinoma in 9 of 21 lymph nodes.       HISTORY OF PRESENTING ILLNESS (05/14/2015):  Bethany Frederick 65 y.o. female is here to discussed her risk of breast cancer.   She has had palpable left breast mass for over 20 years, unchanged. Her mammogram in 2014 showed a 0.8 cm cyst. She does screening mammogram once a year. The lesion looks slightly more suspicious on the mammogram in August 2016, and a biopsy was recommended, which showed sclerosing lesion with typical ductal hyperplasia. She was referred to see  Dr. Marlou Starks and underwent left breast lumpectomy. She tolerated the surgery very well, and has recovered completely.  She feels well, has mild fatigue, she takes care two of her brothers, who live alone and have health issues. She lives with her hsuband and 3 yo son. She remains physically active, does not exercise regularly. She has mild arthritis, takes ibuprofen and Aleve as needed, no other complaints.  GYN HISTORY  Menarchal: 10 LMP: 47 Contraceptive: 23 years  HRT: 5 years, stopped at age of 4  G1P1: one son, she was 69 yo when she had her son   CURRENT THERAPY: Adjuvant chemotherapy with CAPOX (Xeloda 2000 mg twice daily, on day 1-14, oxaliplatin 149m/m2 on day 1) every 3 weeks, started 03/08/2016  [place]. She tolerated the first cycle chemotherapy very well, had a mild cold sensitivity, mild fatigue, no significant nausea, diarrhea, anorexia or other complaints. She has recovered well, her weight is stable. No significant neuropathy.  MEDICAL HISTORY:  Past Medical History:  Diagnosis Date  . Arthritis    back, hip, feet  . [place]    Sigmoid colon  . Cataract, immature   . GERD (gastroesophageal reflux disease)   . Heart palpitations   . 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 Seasonal allergies   . Wears partial dentures    upper front - 1 tooth    SURGICAL HISTORY: Past Surgical History:  Procedure Laterality Date  .  APPENDECTOMY    . BREAST LUMPECTOMY WITH RADIOACTIVE SEED LOCALIZATION Left 03/13/2015   Procedure: BREAST LUMPECTOMY WITH RADIOACTIVE SEED LOCALIZATION;  Surgeon: Autumn Messing III, MD;  Location: [place];  Service: General;  Laterality: Left;  . COLONOSCOPY    . ENDOMETRIAL FULGURATION  06/13/2000  . INGUINAL HERNIA REPAIR  03/05/2012   Procedure: HERNIA REPAIR INGUINAL INCARCERATED;  Surgeon: Ralene Ok, MD;  Location: [place];  Service: General;  Laterality:  N/A;  . INSERTION OF MESH  03/05/2012   Procedure: INSERTION OF MESH;  Surgeon: Ralene Ok, MD;  Location: [place];  Service: General;  Laterality: Left;  . LAPAROSCOPIC SIGMOID COLECTOMY Bilateral 01/28/2016   Procedure: LAPAROSCOPIC ASSISTED SIGMOID COLECTOMY;  Surgeon: Autumn Messing III, MD;  Location: [place];  Service: General;  Laterality: Bilateral;  . MYOMECTOMY  06/13/2000  . PORTACATH PLACEMENT N/A 02/18/2016   Procedure: INSERTION PORT-A-CATH;  Surgeon: Autumn Messing III, MD;  Location: [place];  Service: General;  Laterality: N/A;  . ROTATOR CUFF REPAIR Left    2013    SOCIAL HISTORY: Social History   Social History  . Marital status: Married    Spouse name: N/A  . Number of children: N/A  . Years of education: N/A   Occupational History  . Not on file.   Social History Main Topics  . Smoking status: Never Smoker  . Smokeless tobacco: Never Used  . Alcohol use Yes     Comment: occasionally  . Drug use: No  . Sexual activity: Not Currently   Other Topics Concern  . Not on file   Social History Narrative  . No narrative on file    FAMILY HISTORY: Family History  Problem Relation Age of Onset  . Cancer Brother 73    prostate cancer   . Cancer Paternal Aunt 58    breast cancer   . Cancer Cousin 40    ovarian and breast cancer (maternal cousin)     ALLERGIES:  is allergic to no known allergies.  MEDICATIONS:  Current Outpatient Prescriptions  Medication Sig Dispense Refill  . capecitabine (XELODA) 500 MG tablet Take 4 tablets (2,000 mg total) by mouth 2 (two) times daily after a meal. Take on days 1-14 of chemotherapy. 112 tablet 3  . famotidine (PEPCID) 20 MG tablet Take 20 mg by mouth 2 (two) times daily as needed for heartburn or indigestion.   0  . lidocaine-prilocaine (EMLA) cream Apply 1 application topically as needed. Apply to port site 1 hour prior to stick and cover w/plastic wrap 30 g 11  . ondansetron (ZOFRAN) 8 MG tablet Take 1  tablet (8 mg total) by mouth 2 (two) times daily as needed for refractory nausea / vomiting. Start on day 3 after chemotherapy. 30 tablet 1  . prochlorperazine (COMPAZINE) 10 MG tablet Take 1 tablet (10 mg total) by mouth every 6 (six) hours as needed (Nausea or vomiting). 30 tablet 1  . clobetasol cream (TEMOVATE) 3.54 % Apply 1 application topically daily as needed (RASH).     Marland Kitchen HYDROcodone-acetaminophen (NORCO/VICODIN) 5-325 MG tablet Take 1-2 tablets by mouth every 4 (four) hours as needed for moderate pain or severe pain. (Patient not taking: Reported on 03/29/2016) 20 tablet 0   No current facility-administered medications for this visit.     REVIEW OF SYSTEMS:   Constitutional: Denies fevers, chills or abnormal night sweats Eyes: Denies blurriness of vision, double vision or watery eyes Ears, nose, mouth, throat, and face: Denies  mucositis or sore throat Respiratory: Denies cough, dyspnea or wheezes Cardiovascular: Denies palpitation, chest discomfort or lower extremity swelling Gastrointestinal:  Denies nausea, heartburn or change in bowel habits Skin: Denies abnormal skin rashes Lymphatics: Denies new lymphadenopathy or easy bruising Neurological:Denies numbness, tingling or new weaknesses Behavioral/Psych: Mood is stable, no new changes  All other systems were reviewed with the patient and are negative.  PHYSICAL EXAMINATION: ECOG PERFORMANCE STATUS: 0 BP 124/74 (BP Location: Left Arm, Patient Position: Sitting)   Pulse 70   Temp 98 F (36.7 C) (Oral)   Resp 17   Ht 5' 7"  (1.702 m)   Wt 184 lb 4.8 oz (83.6 kg)   SpO2 100%   BMI 28.87 kg/m   GENERAL:alert, no distress and comfortable  SKIN: skin color, texture, turgor are normal, no rashes or significant lesions EYES: normal, conjunctiva are pink and non-injected, sclera clear OROPHARYNX:no exudate, no erythema and lips, buccal mucosa, and tongue normal  NECK: supple, thyroid normal size, non-tender, without  nodularity LYMPH:  no palpable lymphadenopathy in the cervical, axillary or inguinal LUNGS: clear to auscultation and percussion with normal breathing effort HEART: regular rate & rhythm and no murmurs and no lower extremity edema ABDOMEN:abdomen soft, non-tender and normal bowel sounds, midline incision has healed well, no discharge or skin erythema.  Musculoskeletal:no cyanosis of digits and no clubbing  PSYCH: alert & oriented x 3 with fluent speech NEURO: no focal motor/sensory deficits  LABORATORY DATA:  I have reviewed the data as listed CBC Latest Ref Rng & Units 03/29/2016 03/15/2016 03/08/2016  WBC 3.9 - 10.3 10e3/uL 4.4 5.4 7.0  Hemoglobin 11.6 - 15.9 g/dL 12.7 12.9 13.4  Hematocrit 34.8 - 46.6 % 38.3 39.4 41.3  Platelets 145 - 400 10e3/uL 208 168 169   CMP Latest Ref Rng & Units 03/15/2016 03/08/2016 02/12/2016  Glucose 70 - 140 mg/dl 100 108 115  BUN 7.0 - 26.0 mg/dL 11.4 18.4 13.5  Creatinine 0.6 - 1.1 mg/dL 0.6 0.7 0.8  Sodium 136 - 145 mEq/L 140 141 142  Potassium 3.5 - 5.1 mEq/L 4.3 4.2 4.1  Chloride 101 - 111 mmol/L - - -  CO2 22 - 29 mEq/L 24 23 25   Calcium 8.4 - 10.4 mg/dL 9.4 9.7 9.5  Total Protein 6.4 - 8.3 g/dL 6.5 6.8 6.6  Total Bilirubin 0.20 - 1.20 mg/dL 0.42 0.44 0.38  Alkaline Phos 40 - 150 U/L 177(H) 236(H) 270(H)  AST 5 - 34 U/L 17 17 16   ALT 0 - 55 U/L 22 22 30     PATHOLOGY REPORT  Diagnosis 01/04/2016 Colon, biopsy, rectosigmoid junction - ADENOCARCINOMA, SEE COMMENT. Microscopic Comment Dr. Saralyn Pilar reviewed this case and concurs. The results were discussed with Dr. Watt Climes on 01/05/16. (BNS:gt, 01/05/16)  Diagnosis 01/28/2016 1. Soft tissue, biopsy, Implant on sigmoid colon - BENIGN CALCIFIED NODULE. - NO EVIDENCE OF MALIGNANCY. 2. Colon, segmental resection for tumor, Recto-sigmoid - INVASIVE COLONIC ADENOCARCINOMA, 4.5 CM EXTENDING INTO PERICOLONIC CONNECTIVE TISSUE. - LYMPHATIC VASCULAR INVOLVEMENT BY TUMOR. - MARGINS NOT INVOLVED. -  METASTATIC CARCINOMA IN 9 OF 21 LYMPH NODES (9/21). - SEE ONCOLOGY TABLE BELOW. 3. Colon, resection margin (donut), Proximal and distal of sigmoid - BENIGN PROXIMAL AND DISTAL ANASTOMATIC RINGS. - NO EVIDENCE OF MALIGNANCY. Microscopic Comment 2. COLON AND RECTUM (INCLUDING TRANS-ANAL RESECTION): Specimen: Sigmoid colon. Procedure: Segmental resection. Tumor site: Rectosigmoid. Specimen integrity: Intact. Macroscopic intactness of mesorectum: Near complete. Macroscopic tumor perforation: No. Invasive tumor: Maximum size: 4.5 cm. Histologic type(s): Colorectal adenocarcinoma. Histologic grade and  differentiation: G2: moderately differentiated/low grade. Type of polyp in which invasive carcinoma arose: No residual polyp. Microscopic extension of invasive tumor: Focally into pericolonic connective tissue. Lymph-Vascular invasion: Present. Peri-neural invasion: No. Tumor deposit(s) (discontinuous extramural extension): No. Resection margins: Proximal margin: Free of tumor. Distal margin: Free of tumor. Circumferential (radial) (posterior ascending, posterior descending; lateral and posterior mid-rectum; and entire lower 1/3 rectum): Free of tumor. Mesenteric margin (sigmoid and transverse): Free of tumor. Distance closest margin (if all above margins negative): 1.5 cm from distal margin. Treatment effect (neo-adjuvant therapy): No. Additional polyp(s): No. Non-neoplastic findings: Benign diverticulum. Lymph nodes: number examined 21; number positive: 9. Pathologic Staging: pT3, pN2b, pMX. Ancillary studies: Microsatellite instability by PCR and mismatch repair protein by immunohistochemistry pending. (JDP:ds 02/01/16) Claudette Laws MD Pathologist, Electronic Signature (Case signed 02/01/2016) Specimen Gross and Clinical Information  RADIOGRAPHIC STUDIES: I have personally reviewed the radiological images as listed and agreed with the findings in the report. No results found.    CT abdomen and pelvis with contrast 01/05/2016 IMPRESSION: 1. No dominant sigmoid colon mass identified. There is underdistention and soft tissue fullness at the rectosigmoid junction, suspicious for the primary lesion. Borderline enlarged nodes in the sigmoid mesocolon and at the origin of the inferior mesenteric artery are indeterminate. As these are in the drainage pathway for a sigmoid primary, localized nodal metastasis cannot be excluded. 2. No evidence of distant metastatic disease. 3.  Aortic atherosclerosis. 4. Uterine fibroid. 5. Left hepatic lobe giant hemangioma, as before.  CT chest 02/24/2016 IMPRESSION: 1. No evidence of pulmonary metastasis. 2. Stable large hepatic hemangioma.   Colonoscopy 01/04/2016 Dr. Watt Climes Findings: An ulcerated partially obstructing large mass was found in the rectosigmoid colon. The mass was partially circumferential, measured about 5 cm in length. Biopsy was taken. Multiple small milestone diverticula were found in the sigmoid colon. The exam otherwise was normal.   ASSESSMENT & PLAN:  65 year old Caucasian female, with recently diagnosed colorectal cancer   1. Colorectal cancer, invasive adenocarcinoma,pT3N2bM0, stage IIIC, MSI-stable  -Her primary cancer is located in the rectosigmoid colon. -I reviewed her lab findings with patient and her husband. -She has had complete surgical resection -CT scan was negative for distant metastasis. -Giving the multiple positive lymph nodes, and locally advanced disease, she is at high risk for recurrence. -I recommend adjuvant chemotherapy to reduce her risk of recurrence, treated options of FOLFOX and CAPEOX were discussed with pt and her husband -Based on the recently reported data, 3 months oxaliplatin-based adjuvant chemotherapy has similar outcome as 6 months imminent. However due to her multiple positive lymph nodes, I will recommend 6 months therapy if she can tolerate. If she does have  significant side effects, I'll back off her chemotherapy or even stop after 3 months of therapy. -She tolerated the first cycle chemotherapy well last week, will continue Xeloda -Lab results reviewed with patient, CBC and CMP are within normal limits -She tolerated her first cycle chemotherapy very well, no significant side effects, we'll proceed second cycle today with full dose  2. 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 This is considered a benign breast lesion, not high risk for breast cancer. -I used the Baker Janus model to calculate her risk of developing breast cancer, Based on her age, family history, pregnancy history, etc, her risk of developing breast cancer is 3.3% in 5 years and 11.7% in her lifetime. This is consider moderate risk. -We reviewed  the risk factors for developing breast cancer, and strategies to prevent breast cancer, especially healthy diet, exercise, and maintain ideal weight.  -Chemoprevention with tamoxifen or anastrozole was discussed with her before, and she declined. -We finally discussed the breast cancer screening, including yearly screening mammogram with 3-D technology, self-exam every months and physician exam breast exam once a year.    Plan -Lab reviewed, adequate for treatment, we'll proceed with cycle 2 chemotherapy CAPOX today with full doses of oxaliplatin -She will return in 3 weeks for follow-up and chemotherapy  All questions were answered. The patient knows to call the clinic with any problems, questions or concerns.  I spent 15 minutes counseling the patient face to face. The total time spent in the appointment was 20 minutes and more than 50% was on counseling.  Truitt Merle  03/29/2016

## 2016-03-29 NOTE — Patient Instructions (Signed)
[facility] Discharge Instructions for Patients Receiving Chemotherapy  Today you received the following chemotherapy agents: Oxaliplatin   To help prevent nausea and vomiting after your treatment, we encourage you to take your nausea medication as directed.    If you develop nausea and vomiting that is not controlled by your nausea medication, call the clinic.   BELOW ARE SYMPTOMS THAT SHOULD BE REPORTED IMMEDIATELY:  *FEVER GREATER THAN 100.5 F  *CHILLS WITH OR WITHOUT FEVER  NAUSEA AND VOMITING THAT IS NOT CONTROLLED WITH YOUR NAUSEA MEDICATION  *UNUSUAL SHORTNESS OF BREATH  *UNUSUAL BRUISING OR BLEEDING  TENDERNESS IN MOUTH AND THROAT WITH OR WITHOUT PRESENCE OF ULCERS  *URINARY PROBLEMS  *BOWEL PROBLEMS  UNUSUAL RASH Items with * indicate a potential emergency and should be followed up as soon as possible.  Feel free to call the clinic you have any questions or concerns. The clinic phone number is [phone].  Please show the CHEMO ALERT CARD at check-in to the Emergency Department and triage nurse.   Oxaliplatin Injection What is this medicine? OXALIPLATIN (ox AL i PLA tin) is a chemotherapy drug. It targets fast dividing cells, like cancer cells, and causes these cells to die. This medicine is used to treat cancers of the colon and rectum, and many other cancers. This medicine may be used for other purposes; ask your health care provider or pharmacist if you have questions. What should I tell my health care provider before I take this medicine? They need to know if you have any of these conditions: -kidney disease -an unusual or allergic reaction to oxaliplatin, other chemotherapy, other medicines, foods, dyes, or preservatives -pregnant or trying to get pregnant -breast-feeding How should I use this medicine? This drug is given as an infusion into a vein. It is administered in a hospital or clinic by a specially trained health care  professional. Talk to your pediatrician regarding the use of this medicine in children. Special care may be needed. Overdosage: If you think you have taken too much of this medicine contact a poison control center or emergency room at once. NOTE: This medicine is only for you. Do not share this medicine with others. What if I miss a dose? It is important not to miss a dose. Call your doctor or health care professional if you are unable to keep an appointment. What may interact with this medicine? -medicines to increase blood counts like filgrastim, pegfilgrastim, sargramostim -probenecid -some antibiotics like amikacin, gentamicin, neomycin, polymyxin B, streptomycin, tobramycin -zalcitabine Talk to your doctor or health care professional before taking any of these medicines: -acetaminophen -aspirin -ibuprofen -ketoprofen -naproxen This list may not describe all possible interactions. Give your health care provider a list of all the medicines, herbs, non-prescription drugs, or dietary supplements you use. Also tell them if you smoke, drink alcohol, or use illegal drugs. Some items may interact with your medicine. What should I watch for while using this medicine? Your condition will be monitored carefully while you are receiving this medicine. You will need important blood work done while you are taking this medicine. This medicine can make you more sensitive to cold. Do not drink cold drinks or use ice. Cover exposed skin before coming in contact with cold temperatures or cold objects. When out in cold weather wear warm clothing and cover your mouth and nose to warm the air that goes into your lungs. Tell your doctor if you get sensitive to the cold. This drug may make you   feel generally unwell. This is not uncommon, as chemotherapy can affect healthy cells as well as cancer cells. Report any side effects. Continue your course of treatment even though you feel ill unless your doctor tells you  to stop. In some cases, you may be given additional medicines to help with side effects. Follow all directions for their use. Call your doctor or health care professional for advice if you get a fever, chills or sore throat, or other symptoms of a cold or flu. Do not treat yourself. This drug decreases your body's ability to fight infections. Try to avoid being around people who are sick. This medicine may increase your risk to bruise or bleed. Call your doctor or health care professional if you notice any unusual bleeding. Be careful brushing and flossing your teeth or using a toothpick because you may get an infection or bleed more easily. If you have any dental work done, tell your dentist you are receiving this medicine. Avoid taking products that contain aspirin, acetaminophen, ibuprofen, naproxen, or ketoprofen unless instructed by your doctor. These medicines may hide a fever. Do not become pregnant while taking this medicine. Women should inform their doctor if they wish to become pregnant or think they might be pregnant. There is a potential for serious side effects to an unborn child. Talk to your health care professional or pharmacist for more information. Do not breast-feed an infant while taking this medicine. Call your doctor or health care professional if you get diarrhea. Do not treat yourself. What side effects may I notice from receiving this medicine? Side effects that you should report to your doctor or health care professional as soon as possible: -allergic reactions like skin rash, itching or hives, swelling of the face, lips, or tongue -low blood counts - This drug may decrease the number of white blood cells, red blood cells and platelets. You may be at increased risk for infections and bleeding. -signs of infection - fever or chills, cough, sore throat, pain or difficulty passing urine -signs of decreased platelets or bleeding - bruising, pinpoint red spots on the skin, black,  tarry stools, nosebleeds -signs of decreased red blood cells - unusually weak or tired, fainting spells, lightheadedness -breathing problems -chest pain, pressure -cough -diarrhea -jaw tightness -mouth sores -nausea and vomiting -pain, swelling, redness or irritation at the injection site -pain, tingling, numbness in the hands or feet -problems with balance, talking, walking -redness, blistering, peeling or loosening of the skin, including inside the mouth -trouble passing urine or change in the amount of urine Side effects that usually do not require medical attention (report to your doctor or health care professional if they continue or are bothersome): -changes in vision -constipation -hair loss -loss of appetite -metallic taste in the mouth or changes in taste -stomach pain This list may not describe all possible side effects. Call your doctor for medical advice about side effects. You may report side effects to FDA at 1-800-FDA-1088. Where should I keep my medicine? This drug is given in a hospital or clinic and will not be stored at home. NOTE: This sheet is a summary. It may not cover all possible information. If you have questions about this medicine, talk to your doctor, pharmacist, or health care provider.    2016, Elsevier/Gold Standard. (2007-11-20 17:22:47)  

## 2016-03-29 NOTE — Progress Notes (Signed)
Oncology Nurse Navigator Documentation  Oncology Nurse Navigator Flowsheets 03/29/2016  Navigator Location [facility]  Navigator Encounter Type Treatment  Telephone -  Abnormal Finding Date -  Confirmed Diagnosis Date -  Surgery Date -  Treatment Initiated Date -  Patient Visit Type MedOnc  Treatment Phase Active Tx--CAPOX  #2  Barriers/Navigation Needs Education  Education Other--asking about Xeloda dosing and if it will change?  Interventions Education  Coordination of Care -  Education Method Verbal--informed her that dose will remain the same unless her weight changes a lot or she develops significant adverse effects.  Support Groups/Services -  Acuity -  Time Spent with Patient 15

## 2016-04-01 NOTE — Telephone Encounter (Signed)
Close  

## 2016-04-18 NOTE — Progress Notes (Signed)
[place]  [phone] Fax:(336) [phone]  Clinic Follow up Note   Patient Care Team: [name], MD as PCP - General (Family Medicine) Autumn Messing III, MD as Consulting Physician (General Surgery) 04/19/2016   CHIEF COMPLAINTS:  Recently diagnosed stage III colon cancer   Oncology History   Rectal cancer [facility])   Staging form: Colon and Rectum, AJCC 7th Edition   - Pathologic stage from 01/28/2016: Stage IIIC (T3, N2b, cM0) - Signed by Truitt Merle, MD on 02/12/2016      Cancer of left colon ([place])   01/04/2016 Procedure    Colonoscopy by Dr. Estanislado Emms showed a partially obstructing tumor in the rectal sigmoid colon, biopsied.      01/05/2016 Imaging    CT abdomen and pelvis with contrast showed no dominant sigmoid colon mass, soft tissue fullness at the rectosigmoid junction, suspicious for the primary lesion. Borderline enlarged nodes in the sigmoid mesocolon and at the origin of the inferior mesenteric artery are indeterminate no evidence of December metastasis. Left hepatic lobe giant hemangioma.       01/28/2016 Initial Diagnosis    Rectal cancer ([place])     01/28/2016 Surgery    Endoscopic low anterior resection of rectosigmoid segmental colon for tumor       01/28/2016 Pathology Results    Invasive colonic adenocarcinoma, 4.5 cm extending into her chronic connective tissue, lymphovascular involvement by tumor, margins were negative, metastatic carcinoma in 9 of 21 lymph nodes.      02/24/2016 Imaging    CT chest w/o contrast IMPRESSION: 1. No evidence of pulmonary metastasis. 2. Stable large hepatic hemangioma.       HISTORY OF PRESENTING ILLNESS (05/14/2015):  Bethany Frederick 65 y.o. female is here to discussed her risk of breast cancer.   She has had palpable left breast mass for over 20 years, unchanged. Her mammogram in 2014 showed a 0.8 cm cyst. She does screening mammogram once a year. The lesion looks slightly more suspicious on the  mammogram in August 2016, and a biopsy was recommended, which showed sclerosing lesion with typical ductal hyperplasia. She was referred to see Dr. Marlou Starks and underwent left breast lumpectomy. She tolerated the surgery very well, and has recovered completely.  She feels well, has mild fatigue, she takes care two of her brothers, who live alone and have health issues. She lives with her hsuband and 11 yo son. She remains physically active, does not exercise regularly. She has mild arthritis, takes ibuprofen and Aleve as needed, no other complaints.  GYN HISTORY  Menarchal: 10 LMP: 69 Contraceptive: 23 years  HRT: 5 years, stopped at age of 1  G1P1: one son, she was 46 yo when she had her son   CURRENT THERAPY: Adjuvant chemotherapy with CAPOX (Xeloda 2000 mg twice daily, on day 1-14, oxaliplatin 162m/m2 on day 1) every 3 weeks, started 03/08/2016  [place]. Reports hard stools. Takes Miralax, but will now take a stool softener soon. Nausea, took a couple of her nausea medications. The patient denies neuropathy. Reports poor taste. Reports tremors for 2 days after her 2nd cycle. Denies difficulty sleeping.  MEDICAL HISTORY:  Past Medical History:  Diagnosis Date  . Arthritis    back, hip, feet  . [place]    Sigmoid colon  . Cataract, immature   . GERD (gastroesophageal reflux disease)   . Heart palpitations   . Sclerosing adenosis of left breast  02/2015  . Seasonal allergies   . Wears partial dentures    upper front - 1 tooth    SURGICAL HISTORY: Past Surgical History:  Procedure Laterality Date  . APPENDECTOMY    . BREAST LUMPECTOMY WITH RADIOACTIVE SEED LOCALIZATION Left 03/13/2015   Procedure: BREAST LUMPECTOMY WITH RADIOACTIVE SEED LOCALIZATION;  Surgeon: Autumn Messing III, MD;  Location: [place];  Service: General;  Laterality: Left;  . COLONOSCOPY    . ENDOMETRIAL FULGURATION  06/13/2000  .  INGUINAL HERNIA REPAIR  03/05/2012   Procedure: HERNIA REPAIR INGUINAL INCARCERATED;  Surgeon: Ralene Ok, MD;  Location: [place];  Service: General;  Laterality: N/A;  . INSERTION OF MESH  03/05/2012   Procedure: INSERTION OF MESH;  Surgeon: Ralene Ok, MD;  Location: [place];  Service: General;  Laterality: Left;  . LAPAROSCOPIC SIGMOID COLECTOMY Bilateral 01/28/2016   Procedure: LAPAROSCOPIC ASSISTED SIGMOID COLECTOMY;  Surgeon: Autumn Messing III, MD;  Location: [place];  Service: General;  Laterality: Bilateral;  . MYOMECTOMY  06/13/2000  . PORTACATH PLACEMENT N/A 02/18/2016   Procedure: INSERTION PORT-A-CATH;  Surgeon: Autumn Messing III, MD;  Location: [place];  Service: General;  Laterality: N/A;  . ROTATOR CUFF REPAIR Left    2013    SOCIAL HISTORY: Social History   Social History  . Marital status: Married    Spouse name: N/A  . Number of children: N/A  . Years of education: N/A   Occupational History  . Not on file.   Social History Main Topics  . Smoking status: Never Smoker  . Smokeless tobacco: Never Used  . Alcohol use Yes     Comment: occasionally  . Drug use: No  . Sexual activity: Not Currently   Other Topics Concern  . Not on file   Social History Narrative  . No narrative on file    FAMILY HISTORY: Family History  Problem Relation Age of Onset  . Cancer Brother 35    prostate cancer   . Cancer Paternal Aunt 65    breast cancer   . Cancer Cousin 40    ovarian and breast cancer (maternal cousin)     ALLERGIES:  is allergic to no known allergies.  MEDICATIONS:  Current Outpatient Prescriptions  Medication Sig Dispense Refill  . capecitabine (XELODA) 500 MG tablet Take 4 tablets (2,000 mg total) by mouth 2 (two) times daily after a meal. Take on days 1-14 of chemotherapy. 112 tablet 3  . clobetasol cream (TEMOVATE) 6.16 % Apply 1 application topically daily as needed (RASH).     . famotidine (PEPCID) 20 MG tablet Take 20 mg by mouth 2  (two) times daily as needed for heartburn or indigestion.   0  . lidocaine-prilocaine (EMLA) cream Apply 1 application topically as needed. Apply to port site 1 hour prior to stick and cover w/plastic wrap 30 g 11  . ondansetron (ZOFRAN) 8 MG tablet Take 1 tablet (8 mg total) by mouth 2 (two) times daily as needed for refractory nausea / vomiting. Start on day 3 after chemotherapy. 30 tablet 1  . prochlorperazine (COMPAZINE) 10 MG tablet Take 1 tablet (10 mg total) by mouth every 6 (six) hours as needed (Nausea or vomiting). 30 tablet 1  . HYDROcodone-acetaminophen (NORCO/VICODIN) 5-325 MG tablet Take 1-2 tablets by mouth every 4 (four) hours as needed for moderate pain or severe pain. (Patient not taking: Reported on 04/19/2016) 20 tablet 0   No current facility-administered medications for this visit.  Facility-Administered Medications Ordered in Other Visits  Medication Dose Route Frequency Provider Last Rate Last Dose  . dexamethasone (DECADRON) injection 8 mg  8 mg Intravenous Once Truitt Merle, MD      . dextrose 5 % solution   Intravenous Once Truitt Merle, MD      . heparin lock flush 100 unit/mL  500 Units Intracatheter Once PRN Truitt Merle, MD      . oxaliplatin (ELOXATIN) 260 mg in dextrose 5 % 500 mL chemo infusion  130 mg/m2 (Treatment Plan Recorded) Intravenous Once Truitt Merle, MD      . palonosetron (ALOXI) injection 0.25 mg  0.25 mg Intravenous Once Truitt Merle, MD      . sodium chloride flush (NS) 0.9 % injection 10 mL  10 mL Intracatheter PRN Truitt Merle, MD        REVIEW OF SYSTEMS: Constitutional: Denies fevers, chills or abnormal night sweats Eyes: Denies blurriness of vision, double vision or watery eyes Ears, nose, mouth, throat, and face: Denies mucositis or sore throat Respiratory: Denies cough, dyspnea or wheezes Cardiovascular: Denies palpitation, chest discomfort or lower extremity swelling Gastrointestinal: (+) Constipation and nausea. Skin: Denies abnormal skin rashes. (+) Dry  hands Lymphatics: Denies new lymphadenopathy or easy bruising Neurological:Denies numbness, tingling or new weaknesses. (+) Tremors Behavioral/Psych: Mood is stable, no new changes  All other systems were reviewed with the patient and are negative.  PHYSICAL EXAMINATION: ECOG PERFORMANCE STATUS: 1 BP (!) 143/84 (BP Location: Right Arm, Patient Position: Sitting)   Pulse 69   Temp 97.9 F (36.6 C) (Oral)   Resp 18   Ht 5' 7"  (1.702 m)   Wt 188 lb 1.6 oz (85.3 kg)   SpO2 98%   BMI 29.46 kg/m   GENERAL:alert, no distress and comfortable  SKIN: skin color, texture, turgor are normal, no rashes or significant lesions EYES: normal, conjunctiva are pink and non-injected, sclera clear OROPHARYNX:no exudate, no erythema and lips, buccal mucosa, and tongue normal  NECK: supple, thyroid normal size, non-tender, without nodularity LYMPH:  no palpable lymphadenopathy in the cervical, axillary or inguinal LUNGS: clear to auscultation and percussion with normal breathing effort HEART: regular rate & rhythm and no murmurs and no lower extremity edema ABDOMEN:abdomen soft, non-tender and normal bowel sounds, midline incision has healed well, no discharge or skin erythema.  Musculoskeletal:no cyanosis of digits and no clubbing  PSYCH: alert & oriented x 3 with fluent speech NEURO: no focal motor/sensory deficits  LABORATORY DATA:  I have reviewed the data as listed CBC Latest Ref Rng & Units 04/19/2016 03/29/2016 03/15/2016  WBC 3.9 - 10.3 10e3/uL 3.9 4.4 5.4  Hemoglobin 11.6 - 15.9 g/dL 11.9 12.7 12.9  Hematocrit 34.8 - 46.6 % 36.1 38.3 39.4  Platelets 145 - 400 10e3/uL 182 208 168   CMP Latest Ref Rng & Units 04/19/2016 03/29/2016 03/15/2016  Glucose 70 - 140 mg/dl 101 104 100  BUN 7.0 - 26.0 mg/dL 12.3 12.8 11.4  Creatinine 0.6 - 1.1 mg/dL 0.7 0.6 0.6  Sodium 136 - 145 mEq/L 142 140 140  Potassium 3.5 - 5.1 mEq/L 3.8 4.2 4.3  Chloride 101 - 111 mmol/L - - -  CO2 22 - 29 mEq/L 23 24 24     Calcium 8.4 - 10.4 mg/dL 9.1 9.6 9.4  Total Protein 6.4 - 8.3 g/dL 6.0(L) 6.5 6.5  Total Bilirubin 0.20 - 1.20 mg/dL 0.72 0.43 0.42  Alkaline Phos 40 - 150 U/L 139 149 177(H)  AST 5 - 34 U/L 40(H)  29 17  ALT 0 - 55 U/L 50 33 22   Results for Bethany Frederick, Bethany Frederick (MRN [id number]) as of 04/19/2016 08:21  Ref. Range 02/12/2016 13:30 03/08/2016 09:51 03/29/2016 12:52  CE[facility] ([facility]) Latest Ref Range: 0.00 - 5.00 ng/mL 1.56 1.55 2.02    PATHOLOGY REPORT  Diagnosis 01/04/2016 Colon, biopsy, rectosigmoid junction - ADENOCARCINOMA, SEE COMMENT. Microscopic Comment Dr. Saralyn Pilar reviewed this case and concurs. The results were discussed with Dr. Watt Climes on 01/05/16. (BNS:gt, 01/05/16)  Diagnosis 01/28/2016 1. Soft tissue, biopsy, Implant on sigmoid colon - BENIGN CALCIFIED NODULE. - NO EVIDENCE OF MALIGNANCY. 2. Colon, segmental resection for tumor, Recto-sigmoid - INVASIVE COLONIC ADENOCARCINOMA, 4.5 CM EXTENDING INTO PERICOLONIC CONNECTIVE TISSUE. - LYMPHATIC VASCULAR INVOLVEMENT BY TUMOR. - MARGINS NOT INVOLVED. - METASTATIC CARCINOMA IN 9 OF 21 LYMPH NODES (9/21). - SEE ONCOLOGY TABLE BELOW. 3. Colon, resection margin (donut), Proximal and distal of sigmoid - BENIGN PROXIMAL AND DISTAL ANASTOMATIC RINGS. - NO EVIDENCE OF MALIGNANCY. Microscopic Comment 2. COLON AND RECTUM (INCLUDING TRANS-ANAL RESECTION): Specimen: Sigmoid colon. Procedure: Segmental resection. Tumor site: Rectosigmoid. Specimen integrity: Intact. Macroscopic intactness of mesorectum: Near complete. Macroscopic tumor perforation: No. Invasive tumor: Maximum size: 4.5 cm. Histologic type(s): Colorectal adenocarcinoma. Histologic grade and differentiation: G2: moderately differentiated/low grade. Type of polyp in which invasive carcinoma arose: No residual polyp. Microscopic extension of invasive tumor: Focally into pericolonic connective tissue. Lymph-Vascular invasion: Present. Peri-neural invasion: No. Tumor  deposit(s) (discontinuous extramural extension): No. Resection margins: Proximal margin: Free of tumor. Distal margin: Free of tumor. Circumferential (radial) (posterior ascending, posterior descending; lateral and posterior mid-rectum; and entire lower 1/3 rectum): Free of tumor. Mesenteric margin (sigmoid and transverse): Free of tumor. Distance closest margin (if all above margins negative): 1.5 cm from distal margin. Treatment effect (neo-adjuvant therapy): No. Additional polyp(s): No. Non-neoplastic findings: Benign diverticulum. Lymph nodes: number examined 21; number positive: 9. Pathologic Staging: pT3, pN2b, pMX. Ancillary studies: Microsatellite instability by PCR and mismatch repair protein by immunohistochemistry pending. (JDP:ds 02/01/16) Claudette Laws MD Pathologist, Electronic Signature (Case signed 02/01/2016) Specimen Gross and Clinical Information  RADIOGRAPHIC STUDIES: I have personally reviewed the radiological images as listed and agreed with the findings in the report. No results found.   CT abdomen and pelvis with contrast 01/05/2016 IMPRESSION: 1. No dominant sigmoid colon mass identified. There is underdistention and soft tissue fullness at the rectosigmoid junction, suspicious for the primary lesion. Borderline enlarged nodes in the sigmoid mesocolon and at the origin of the inferior mesenteric artery are indeterminate. As these are in the drainage pathway for a sigmoid primary, localized nodal metastasis cannot be excluded. 2. No evidence of distant metastatic disease. 3.  Aortic atherosclerosis. 4. Uterine fibroid. 5. Left hepatic lobe giant hemangioma, as before.  CT chest 02/24/2016 IMPRESSION: 1. No evidence of pulmonary metastasis. 2. Stable large hepatic hemangioma.   Colonoscopy 01/04/2016 Dr. Watt Climes Findings: An ulcerated partially obstructing large mass was found in the rectosigmoid colon. The mass was partially circumferential, measured  about 5 cm in length. Biopsy was taken. Multiple small milestone diverticula were found in the sigmoid colon. The exam otherwise was normal.   ASSESSMENT & PLAN:  65 y.o. Caucasian female, with recently diagnosed colorectal cancer   1. Colorectal cancer, invasive adenocarcinoma,pT3N2bM0, stage IIIC, MSI-stable  -Her primary cancer is located in the rectosigmoid colon. -I previously reviewed her lab findings with patient and her husband. -She has had complete surgical resection -CT scan was negative for distant metastasis. -Giving the multiple positive lymph nodes, and  locally advanced disease, she is at high risk for recurrence. -I recommended adjuvant chemotherapy to reduce her risk of recurrence, treated options of FOLFOX and CAPEOX were discussed with pt and her husband -Based on the recently reported data, 3 months oxaliplatin-based adjuvant chemotherapy has similar outcome as 6 months imminent. However due to her multiple positive lymph nodes, I will recommend 6 months therapy if she can tolerate. If she does have significant side effects, I'll back off her chemotherapy or even stop after 3 months of therapy. -She has been tolerating chemotherapy well, is mildly side effects, such as cold sensitivity, fatigue, mild nausea, manageable overall. -Lab results reviewed with patient, CBC and CMP are within normal limits, adequate for treatment, we'll proceed to cycle 3 chemotherapy today. -Due to her treatment, I will slightly decrease her premedication dexamethasone to 3m   2. 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 This is considered a benign breast lesion, not high risk for breast cancer. -I previously used the GBaker Janusmodel to calculate her risk of developing breast cancer, Based on her age, family history, pregnancy history, etc, her risk of developing breast cancer is 3.3% in 5  years and 11.7% in her lifetime. This is consider moderate risk. -We previously reviewed the risk factors for developing breast cancer, and strategies to prevent breast cancer, especially healthy diet, exercise, and maintain ideal weight.  -Chemoprevention with tamoxifen or anastrozole was discussed with her before, and she declined. -We discussed the breast cancer screening, including yearly screening mammogram with 3-D technology, self-exam every months and physician exam breast exam once a year.   3. Constipation -I advised the patient to take a stool softener regularly, and he uses laxative as needed.  4. Nausea -I advised the patient to take her nausea medication about 30 minutes prior to eating. -I encouraged her to drink ginger tea or eat ginger candy.  5. Tremors -The patient reports tremors after her 2nd cycles of chemotherapy. -I suspect this is from her steroids from chemotherapy, I will reduce the dose for the 3rd cycle today. Reduce steroids on the 3rd cycle from 10 mg to 8 mg dexamethasone.   Plan -Lab reviewed, adequate for treatment, we'll proceed with cycle 3 chemotherapy CAPOX today with full doses of oxaliplatin. Reduce steroids on the 3rd cycle from 10 mg to 8 mg dexamethasone. -She will return in 3 weeks for follow-up and 4th cycle of chemotherapy, 05/10/16  All questions were answered. The patient knows to call the clinic with any problems, questions or concerns.  I spent 20 minutes counseling the patient face to face. The total time spent in the appointment was 25 minutes and more than 50% was on counseling.  FTruitt Merle 04/19/2016   This document serves as a record of services personally performed by YTruitt Merle MD. It was created on her behalf by JDarcus Austin a trained medical scribe. The creation of this record is based on the scribe's personal observations and the provider's statements to them. This document has been checked and approved by the attending  provider.

## 2016-04-19 ENCOUNTER — Ambulatory Visit: Payer: Federal, State, Local not specified - PPO

## 2016-04-19 ENCOUNTER — Encounter: Payer: Self-pay | Admitting: Hematology

## 2016-04-19 ENCOUNTER — Ambulatory Visit (HOSPITAL_BASED_OUTPATIENT_CLINIC_OR_DEPARTMENT_OTHER): Payer: Federal, State, Local not specified - PPO | Admitting: Hematology

## 2016-04-19 ENCOUNTER — Ambulatory Visit (HOSPITAL_BASED_OUTPATIENT_CLINIC_OR_DEPARTMENT_OTHER): Payer: Federal, State, Local not specified - PPO

## 2016-04-19 ENCOUNTER — Other Ambulatory Visit (HOSPITAL_BASED_OUTPATIENT_CLINIC_OR_DEPARTMENT_OTHER): Payer: Federal, State, Local not specified - PPO

## 2016-04-19 VITALS — BP 143/84 | HR 69 | Temp 97.9°F | Resp 18 | Ht 67.0 in | Wt 188.1 lb

## 2016-04-19 DIAGNOSIS — C779 Secondary and unspecified malignant neoplasm of lymph node, unspecified: Secondary | ICD-10-CM

## 2016-04-19 DIAGNOSIS — C19 Malignant neoplasm of rectosigmoid junction: Secondary | ICD-10-CM

## 2016-04-19 DIAGNOSIS — R251 Tremor, unspecified: Secondary | ICD-10-CM

## 2016-04-19 DIAGNOSIS — R110 Nausea: Secondary | ICD-10-CM | POA: Diagnosis not present

## 2016-04-19 DIAGNOSIS — Z5111 Encounter for antineoplastic chemotherapy: Secondary | ICD-10-CM

## 2016-04-19 DIAGNOSIS — C20 Malignant neoplasm of rectum: Secondary | ICD-10-CM

## 2016-04-19 DIAGNOSIS — N6092 Unspecified benign mammary dysplasia of left breast: Secondary | ICD-10-CM

## 2016-04-19 DIAGNOSIS — Z95828 Presence of other vascular implants and grafts: Secondary | ICD-10-CM

## 2016-04-19 DIAGNOSIS — C186 Malignant neoplasm of descending colon: Secondary | ICD-10-CM

## 2016-04-19 LAB — COMPREHENSIVE METABOLIC PANEL
ALT: 50 U/L (ref 0–55)
ANION GAP: 9 meq/L (ref 3–11)
AST: 40 U/L — AB (ref 5–34)
Albumin: 3.4 g/dL — ABNORMAL LOW (ref 3.5–5.0)
Alkaline Phosphatase: 139 U/L (ref 40–150)
BUN: 12.3 mg/dL (ref 7.0–26.0)
CALCIUM: 9.1 mg/dL (ref 8.4–10.4)
CHLORIDE: 110 meq/L — AB (ref 98–109)
CO2: 23 meq/L (ref 22–29)
CREATININE: 0.7 mg/dL (ref 0.6–1.1)
EGFR: >90 mL/min/{1.73_m2} (ref >90)
Glucose: 101 mg/dl (ref 70–140)
POTASSIUM: 3.8 meq/L (ref 3.5–5.1)
Sodium: 142 mEq/L (ref 136–145)
Total Bilirubin: 0.72 mg/dL (ref 0.20–1.20)
Total Protein: 6 g/dL — ABNORMAL LOW (ref 6.4–8.3)

## 2016-04-19 LAB — CBC WITH DIFFERENTIAL/PLATELET
BASO%: 2.2 % — AB (ref 0.0–2.0)
BASOS ABS: 0.1 10*3/uL (ref 0.0–0.1)
EOS%: 3.1 % (ref 0.0–7.0)
Eosinophils Absolute: 0.1 10*3/uL (ref 0.0–0.5)
HEMATOCRIT: 36.1 % (ref 34.8–46.6)
HGB: 11.9 g/dL (ref 11.6–15.9)
LYMPH%: 29.6 % (ref 14.0–49.7)
MCH: 29.5 pg (ref 25.1–34.0)
MCHC: 33.1 g/dL (ref 31.5–36.0)
MCV: 89 fL (ref 79.5–101.0)
MONO#: 0.6 10*3/uL (ref 0.1–0.9)
MONO%: 16.6 % — ABNORMAL HIGH (ref 0.0–14.0)
NEUT#: 1.9 10*3/uL (ref 1.5–6.5)
NEUT%: 48.5 % (ref 38.4–76.8)
Platelets: 182 10*3/uL (ref 145–400)
RBC: 4.05 10*6/uL (ref 3.70–5.45)
RDW: 16.7 % — ABNORMAL HIGH (ref 11.2–14.5)
WBC: 3.9 10*3/uL (ref 3.9–10.3)
lymph#: 1.2 10*3/uL (ref 0.9–3.3)

## 2016-04-19 MED ORDER — DEXTROSE 5 % IV SOLN
125.0000 mg/m2 | Freq: Once | INTRAVENOUS | Status: AC
  Administered 2016-04-19: 250 mg via INTRAVENOUS
  Filled 2016-04-19: qty 40

## 2016-04-19 MED ORDER — SODIUM CHLORIDE 0.9% FLUSH
10.0000 mL | INTRAVENOUS | Status: DC | PRN
  Administered 2016-04-19: 10 mL via INTRAVENOUS
  Filled 2016-04-19: qty 10

## 2016-04-19 MED ORDER — PALONOSETRON HCL INJECTION 0.25 MG/5ML
0.2500 mg | Freq: Once | INTRAVENOUS | Status: AC
  Administered 2016-04-19: 0.25 mg via INTRAVENOUS

## 2016-04-19 MED ORDER — DEXTROSE 5 % IV SOLN
Freq: Once | INTRAVENOUS | Status: AC
  Administered 2016-04-19: 10:00:00 via INTRAVENOUS

## 2016-04-19 MED ORDER — DEXAMETHASONE SODIUM PHOSPHATE 10 MG/ML IJ SOLN
INTRAMUSCULAR | Status: AC
  Filled 2016-04-19: qty 1

## 2016-04-19 MED ORDER — SODIUM CHLORIDE 0.9% FLUSH
10.0000 mL | INTRAVENOUS | Status: DC | PRN
  Administered 2016-04-19: 10 mL
  Filled 2016-04-19: qty 10

## 2016-04-19 MED ORDER — DEXAMETHASONE SODIUM PHOSPHATE 10 MG/ML IJ SOLN
8.0000 mg | Freq: Once | INTRAMUSCULAR | Status: AC
  Administered 2016-04-19: 8 mg via INTRAVENOUS

## 2016-04-19 MED ORDER — HEPARIN SOD (PORK) LOCK FLUSH 100 UNIT/ML IV SOLN
500.0000 [IU] | Freq: Once | INTRAVENOUS | Status: AC | PRN
  Administered 2016-04-19: 500 [IU]
  Filled 2016-04-19: qty 5

## 2016-04-19 MED ORDER — DEXTROSE 5 % IV SOLN: 130.0000 mg/m2 | Freq: Once | INTRAVENOUS | Status: DC

## 2016-04-19 MED ORDER — PALONOSETRON HCL INJECTION 0.25 MG/5ML
INTRAVENOUS | Status: AC
  Filled 2016-04-19: qty 5

## 2016-04-19 NOTE — Patient Instructions (Signed)
[facility] Discharge Instructions for Patients Receiving Chemotherapy  Today you received the following chemotherapy agents Oxaliplatin  To help prevent nausea and vomiting after your treatment, we encourage you to take your nausea medication as directed.   If you develop nausea and vomiting that is not controlled by your nausea medication, call the clinic.   BELOW ARE SYMPTOMS THAT SHOULD BE REPORTED IMMEDIATELY:  *FEVER GREATER THAN 100.5 F  *CHILLS WITH OR WITHOUT FEVER  NAUSEA AND VOMITING THAT IS NOT CONTROLLED WITH YOUR NAUSEA MEDICATION  *UNUSUAL SHORTNESS OF BREATH  *UNUSUAL BRUISING OR BLEEDING  TENDERNESS IN MOUTH AND THROAT WITH OR WITHOUT PRESENCE OF ULCERS  *URINARY PROBLEMS  *BOWEL PROBLEMS  UNUSUAL RASH Items with * indicate a potential emergency and should be followed up as soon as possible.  Feel free to call the clinic you have any questions or concerns. The clinic phone number is [phone].  Please show the CHEMO ALERT CARD at check-in to the Emergency Department and triage nurse.    

## 2016-04-25 ENCOUNTER — Telehealth: Payer: Self-pay | Admitting: *Deleted

## 2016-04-25 NOTE — Telephone Encounter (Signed)
Received vm call from pt stating that she is having problems with constipation & req call back.  Returned call & she states that she is having excreciating pain intermittantly when stool drops down & is passing hard small balls.  She is taking a stool softener-colace bid, miralax daily & had tried ducolax oral but stopped after fri b/c she didn't think it was helping.  She states she gave herself an enema with some relief yest.  Discussed issue of infection with enemas & supp but Dr Burr Medico states OK for her to try ducolax supp & to be very gentle.  Suggested she try Mag Citrate first- 1/2 bottle & if no results in 2-4 hours take other half & then if no results try ducolax supp.  Encouraged increase if oral fluids, fruits, vegetables & perhaps prune juice.  Informed to hold on all above if she develops diarrhea.  Also discussed changing colace to senakot-S.  She will get back with [place] if no improvement.

## 2016-04-27 ENCOUNTER — Encounter: Payer: Self-pay | Admitting: *Deleted

## 2016-05-10 ENCOUNTER — Other Ambulatory Visit (HOSPITAL_BASED_OUTPATIENT_CLINIC_OR_DEPARTMENT_OTHER): Payer: Federal, State, Local not specified - PPO

## 2016-05-10 ENCOUNTER — Ambulatory Visit: Payer: Federal, State, Local not specified - PPO

## 2016-05-10 ENCOUNTER — Encounter: Payer: Self-pay | Admitting: Hematology

## 2016-05-10 ENCOUNTER — Ambulatory Visit (HOSPITAL_BASED_OUTPATIENT_CLINIC_OR_DEPARTMENT_OTHER): Payer: Federal, State, Local not specified - PPO | Admitting: Hematology

## 2016-05-10 ENCOUNTER — Telehealth: Payer: Self-pay | Admitting: Hematology

## 2016-05-10 ENCOUNTER — Ambulatory Visit (HOSPITAL_BASED_OUTPATIENT_CLINIC_OR_DEPARTMENT_OTHER): Payer: Federal, State, Local not specified - PPO

## 2016-05-10 VITALS — BP 150/61 | HR 80 | Temp 98.2°F | Resp 17 | Ht 67.0 in | Wt 188.3 lb

## 2016-05-10 DIAGNOSIS — C19 Malignant neoplasm of rectosigmoid junction: Secondary | ICD-10-CM

## 2016-05-10 DIAGNOSIS — C186 Malignant neoplasm of descending colon: Secondary | ICD-10-CM

## 2016-05-10 DIAGNOSIS — N6092 Unspecified benign mammary dysplasia of left breast: Secondary | ICD-10-CM

## 2016-05-10 DIAGNOSIS — D591 Other autoimmune hemolytic anemias: Secondary | ICD-10-CM

## 2016-05-10 DIAGNOSIS — C779 Secondary and unspecified malignant neoplasm of lymph node, unspecified: Secondary | ICD-10-CM

## 2016-05-10 DIAGNOSIS — Z95828 Presence of other vascular implants and grafts: Secondary | ICD-10-CM

## 2016-05-10 DIAGNOSIS — K5900 Constipation, unspecified: Secondary | ICD-10-CM

## 2016-05-10 DIAGNOSIS — G620 Drug-induced polyneuropathy: Secondary | ICD-10-CM

## 2016-05-10 DIAGNOSIS — Z5111 Encounter for antineoplastic chemotherapy: Secondary | ICD-10-CM | POA: Diagnosis not present

## 2016-05-10 DIAGNOSIS — I10 Essential (primary) hypertension: Secondary | ICD-10-CM

## 2016-05-10 DIAGNOSIS — R110 Nausea: Secondary | ICD-10-CM

## 2016-05-10 DIAGNOSIS — C20 Malignant neoplasm of rectum: Secondary | ICD-10-CM

## 2016-05-10 LAB — CBC WITH DIFFERENTIAL/PLATELET
BASO%: 1.7 % (ref 0.0–2.0)
BASOS ABS: 0.1 10*3/uL (ref 0.0–0.1)
EOS ABS: 0.1 10*3/uL (ref 0.0–0.5)
EOS%: 3.5 % (ref 0.0–7.0)
HCT: 36.9 % (ref 34.8–46.6)
HGB: 12.1 g/dL (ref 11.6–15.9)
LYMPH%: 33.6 % (ref 14.0–49.7)
MCH: 29.9 pg (ref 25.1–34.0)
MCHC: 32.8 g/dL (ref 31.5–36.0)
MCV: 91 fL (ref 79.5–101.0)
MONO#: 0.5 10*3/uL (ref 0.1–0.9)
MONO%: 12.8 % (ref 0.0–14.0)
NEUT#: 1.9 10*3/uL (ref 1.5–6.5)
NEUT%: 48.4 % (ref 38.4–76.8)
Platelets: 149 10*3/uL (ref 145–400)
RBC: 4.05 10*6/uL (ref 3.70–5.45)
RDW: 20.6 % — ABNORMAL HIGH (ref 11.2–14.5)
WBC: 4 10*3/uL (ref 3.9–10.3)
lymph#: 1.3 10*3/uL (ref 0.9–3.3)

## 2016-05-10 LAB — CEA (IN HOUSE-CHCC): CEA (CHCC-IN HOUSE): 4.01 ng/mL (ref 0.00–5.00)

## 2016-05-10 LAB — COMPREHENSIVE METABOLIC PANEL
ALK PHOS: 151 U/L — AB (ref 40–150)
ALT: 23 U/L (ref 0–55)
AST: 29 U/L (ref 5–34)
Albumin: 3.5 g/dL (ref 3.5–5.0)
Anion Gap: 10 mEq/L (ref 3–11)
BUN: 11.8 mg/dL (ref 7.0–26.0)
CHLORIDE: 110 meq/L — AB (ref 98–109)
CO2: 22 mEq/L (ref 22–29)
Calcium: 9.1 mg/dL (ref 8.4–10.4)
Creatinine: 0.6 mg/dL (ref 0.6–1.1)
GLUCOSE: 109 mg/dL (ref 70–140)
POTASSIUM: 3.6 meq/L (ref 3.5–5.1)
SODIUM: 142 meq/L (ref 136–145)
Total Bilirubin: 0.74 mg/dL (ref 0.20–1.20)
Total Protein: 5.8 g/dL — ABNORMAL LOW (ref 6.4–8.3)

## 2016-05-10 MED ORDER — DEXAMETHASONE SODIUM PHOSPHATE 10 MG/ML IJ SOLN
INTRAMUSCULAR | Status: AC
  Filled 2016-05-10: qty 1

## 2016-05-10 MED ORDER — SODIUM CHLORIDE 0.9% FLUSH
10.0000 mL | INTRAVENOUS | Status: DC | PRN
  Administered 2016-05-10: 10 mL
  Filled 2016-05-10: qty 10

## 2016-05-10 MED ORDER — OXALIPLATIN CHEMO INJECTION 100 MG/20ML
126.0000 mg/m2 | Freq: Once | INTRAVENOUS | Status: AC
  Administered 2016-05-10: 250 mg via INTRAVENOUS
  Filled 2016-05-10: qty 40

## 2016-05-10 MED ORDER — DEXAMETHASONE SODIUM PHOSPHATE 10 MG/ML IJ SOLN
8.0000 mg | Freq: Once | INTRAMUSCULAR | Status: AC
  Administered 2016-05-10: 8 mg via INTRAVENOUS

## 2016-05-10 MED ORDER — PALONOSETRON HCL INJECTION 0.25 MG/5ML
INTRAVENOUS | Status: AC
  Filled 2016-05-10: qty 5

## 2016-05-10 MED ORDER — SODIUM CHLORIDE 0.9% FLUSH
10.0000 mL | INTRAVENOUS | Status: DC | PRN
  Administered 2016-05-10: 10 mL via INTRAVENOUS
  Filled 2016-05-10: qty 10

## 2016-05-10 MED ORDER — PALONOSETRON HCL INJECTION 0.25 MG/5ML
0.2500 mg | Freq: Once | INTRAVENOUS | Status: AC
  Administered 2016-05-10: 0.25 mg via INTRAVENOUS

## 2016-05-10 MED ORDER — DEXTROSE 5 % IV SOLN
Freq: Once | INTRAVENOUS | Status: AC
  Administered 2016-05-10: 14:00:00 via INTRAVENOUS

## 2016-05-10 NOTE — Telephone Encounter (Signed)
Appointments scheduled per 1/2 LOS. Patient given AVS report and calendars with future scheduled appointments. °

## 2016-05-10 NOTE — Patient Instructions (Signed)
[facility] Discharge Instructions for Patients Receiving Chemotherapy  Today you received the following chemotherapy agents Oxaliplatin  To help prevent nausea and vomiting after your treatment, we encourage you to take your nausea medication as directed.   If you develop nausea and vomiting that is not controlled by your nausea medication, call the clinic.   BELOW ARE SYMPTOMS THAT SHOULD BE REPORTED IMMEDIATELY:  *FEVER GREATER THAN 100.5 F  *CHILLS WITH OR WITHOUT FEVER  NAUSEA AND VOMITING THAT IS NOT CONTROLLED WITH YOUR NAUSEA MEDICATION  *UNUSUAL SHORTNESS OF BREATH  *UNUSUAL BRUISING OR BLEEDING  TENDERNESS IN MOUTH AND THROAT WITH OR WITHOUT PRESENCE OF ULCERS  *URINARY PROBLEMS  *BOWEL PROBLEMS  UNUSUAL RASH Items with * indicate a potential emergency and should be followed up as soon as possible.  Feel free to call the clinic you have any questions or concerns. The clinic phone number is [phone].  Please show the CHEMO ALERT CARD at check-in to the Emergency Department and triage nurse.    

## 2016-05-10 NOTE — Progress Notes (Signed)
[place]  Telephone:([phone]) [phone] Fax:([phone]) [phone]  Clinic Follow up Note   Patient Care Team: [name], MD as PCP - General (Family Medicine) Autumn Messing III, MD as Consulting Physician (General Surgery) 05/10/2016   CHIEF COMPLAINTS:  Recently diagnosed stage III colon cancer   Oncology History   Rectal cancer [facility])   Staging form: Colon and Rectum, AJCC 7th Edition   - Pathologic stage from 01/28/2016: Stage IIIC (T3, N2b, cM0) - Signed by Truitt Merle, MD on 02/12/2016      Cancer of left colon ([place])   01/04/2016 Procedure    Colonoscopy by Dr. Estanislado Emms showed a partially obstructing tumor in the rectal sigmoid colon, biopsied.      01/05/2016 Imaging    CT abdomen and pelvis with contrast showed no dominant sigmoid colon mass, soft tissue fullness at the rectosigmoid junction, suspicious for the primary lesion. Borderline enlarged nodes in the sigmoid mesocolon and at the origin of the inferior mesenteric artery are indeterminate no evidence of December metastasis. Left hepatic lobe giant hemangioma.       01/28/2016 Initial Diagnosis    Rectal cancer ([place])     01/28/2016 Surgery    Endoscopic low anterior resection of rectosigmoid segmental colon for tumor       01/28/2016 Pathology Results    Invasive colonic adenocarcinoma, 4.5 cm extending into her chronic connective tissue, lymphovascular involvement by tumor, margins were negative, metastatic carcinoma in 9 of 21 lymph nodes.      02/24/2016 Imaging    CT chest w/o contrast IMPRESSION: 1. No evidence of pulmonary metastasis. 2. Stable large hepatic hemangioma.       HISTORY OF PRESENTING ILLNESS (05/14/2015):  Bethany Frederick 66 y.o. female is here to discussed her risk of breast cancer.   She has had palpable left breast mass for over 20 years, unchanged. Her mammogram in 2014 showed a 0.8 cm cyst. She does screening mammogram once a year. The lesion looks slightly more suspicious on the  mammogram in August 2016, and a biopsy was recommended, which showed sclerosing lesion with typical ductal hyperplasia. She was referred to see Dr. Marlou Starks and underwent left breast lumpectomy. She tolerated the surgery very well, and has recovered completely.  She feels well, has mild fatigue, she takes care two of her brothers, who live alone and have health issues. She lives with her hsuband and 54 yo son. She remains physically active, does not exercise regularly. She has mild arthritis, takes ibuprofen and Aleve as needed, no other complaints.  GYN HISTORY  Menarchal: 10 LMP: 27 Contraceptive: 24 years  HRT: 5 years, stopped at age of 44  G1P1: one son, she was 3 yo when she had her son   CURRENT THERAPY: Adjuvant chemotherapy with CAPOX (Xeloda 2000 mg twice daily, on day 1-14, oxaliplatin 178m/m2 on day 1) every 3 weeks, started 03/08/2016  [place] for follow-up and third cycle chemotherapy. She has been tolerating chemotherapy well overall, he did take longer time to recover from last cycle chemotherapy. She still has constipation and cold sensitivity has major issues after chemotherapy, mild and intermittent numbness and tingling of her fingers, no interfere with her hand functions. Mild fatigue, she is able to function well at home. Her weight is stable. No other new complaints  MEDICAL HISTORY:  Past Medical History:  Diagnosis Date  . Arthritis    back, hip, feet  . Cancer [place]    Sigmoid colon  .  Cataract, immature   . GERD (gastroesophageal reflux disease)   . Heart palpitations   . 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 Seasonal allergies   . Wears partial dentures    upper front - 1 tooth    SURGICAL HISTORY: Past Surgical History:  Procedure Laterality Date  . APPENDECTOMY    . BREAST LUMPECTOMY WITH RADIOACTIVE SEED LOCALIZATION Left 03/13/2015   Procedure: BREAST LUMPECTOMY WITH RADIOACTIVE SEED LOCALIZATION;  Surgeon: Autumn Messing III,  MD;  Location: [place];  Service: General;  Laterality: Left;  . COLONOSCOPY    . ENDOMETRIAL FULGURATION  06/13/2000  . INGUINAL HERNIA REPAIR  03/05/2012   Procedure: HERNIA REPAIR INGUINAL INCARCERATED;  Surgeon: Ralene Ok, MD;  Location: [place];  Service: General;  Laterality: N/A;  . INSERTION OF MESH  03/05/2012   Procedure: INSERTION OF MESH;  Surgeon: Ralene Ok, MD;  Location: [place];  Service: General;  Laterality: Left;  . LAPAROSCOPIC SIGMOID COLECTOMY Bilateral 01/28/2016   Procedure: LAPAROSCOPIC ASSISTED SIGMOID COLECTOMY;  Surgeon: Autumn Messing III, MD;  Location: [place];  Service: General;  Laterality: Bilateral;  . MYOMECTOMY  06/13/2000  . PORTACATH PLACEMENT N/A 02/18/2016   Procedure: INSERTION PORT-A-CATH;  Surgeon: Autumn Messing III, MD;  Location: [place];  Service: General;  Laterality: N/A;  . ROTATOR CUFF REPAIR Left    2013    SOCIAL HISTORY: Social History   Social History  . Marital status: Married    Spouse name: N/A  . Number of children: N/A  . Years of education: N/A   Occupational History  . Not on file.   Social History Main Topics  . Smoking status: Never Smoker  . Smokeless tobacco: Never Used  . Alcohol use Yes     Comment: occasionally  . Drug use: No  . Sexual activity: Not Currently   Other Topics Concern  . Not on file   Social History Narrative  . No narrative on file    FAMILY HISTORY: Family History  Problem Relation Age of Onset  . Cancer Brother 52    prostate cancer   . Cancer Paternal Aunt 74    breast cancer   . Cancer Cousin 40    ovarian and breast cancer (maternal cousin)     ALLERGIES:  is allergic to no known allergies.  MEDICATIONS:  Current Outpatient Prescriptions  Medication Sig Dispense Refill  . capecitabine (XELODA) 500 MG tablet Take 4 tablets (2,000 mg total) by mouth 2 (two) times daily after a meal. Take on days 1-14 of chemotherapy. 112 tablet 3  . clobetasol  cream (TEMOVATE) 2.33 % Apply 1 application topically daily as needed (RASH).     . famotidine (PEPCID) 20 MG tablet Take 20 mg by mouth 2 (two) times daily as needed for heartburn or indigestion.   0  . lidocaine-prilocaine (EMLA) cream Apply 1 application topically as needed. Apply to port site 1 hour prior to stick and cover w/plastic wrap 30 g 11  . ondansetron (ZOFRAN) 8 MG tablet Take 1 tablet (8 mg total) by mouth 2 (two) times daily as needed for refractory nausea / vomiting. Start on day 3 after chemotherapy. 30 tablet 1  . prochlorperazine (COMPAZINE) 10 MG tablet Take 1 tablet (10 mg total) by mouth every 6 (six) hours as needed (Nausea or vomiting). 30 tablet 1  . HYDROcodone-acetaminophen (NORCO/VICODIN) 5-325 MG tablet Take 1-2 tablets by mouth every 4 (four) hours as needed for moderate pain  or severe pain. (Patient not taking: Reported on 05/10/2016) 20 tablet 0   No current facility-administered medications for this visit.     REVIEW OF SYSTEMS: Constitutional: Denies fevers, chills or abnormal night sweats Eyes: Denies blurriness of vision, double vision or watery eyes Ears, nose, mouth, throat, and face: Denies mucositis or sore throat Respiratory: Denies cough, dyspnea or wheezes Cardiovascular: Denies palpitation, chest discomfort or lower extremity swelling Gastrointestinal: (+) Constipation and nausea. Skin: Denies abnormal skin rashes. (+) Dry hands Lymphatics: Denies new lymphadenopathy or easy bruising Neurological:Denies numbness, tingling or new weaknesses. (+) Tremors Behavioral/Psych: Mood is stable, no new changes  All other systems were reviewed with the patient and are negative.  PHYSICAL EXAMINATION: ECOG PERFORMANCE STATUS: 1 BP (!) 150/61 (BP Location: Left Arm, Patient Position: Sitting) Comment: Made Nurse aware of elevated BP  Pulse 80   Temp 98.2 F (36.8 C) (Oral)   Resp 17   Ht _0  (1.702 m)   Wt 188 lb 4.8 oz (85.4 kg)   SpO2 99%   BMI 29.49  kg/m   GENERAL:alert, no distress and comfortable  SKIN: skin color, texture, turgor are normal, no rashes or significant lesions EYES: normal, conjunctiva are pink and non-injected, sclera clear OROPHARYNX:no exudate, no erythema and lips, buccal mucosa, and tongue normal  NECK: supple, thyroid normal size, non-tender, without nodularity LYMPH:  no palpable lymphadenopathy in the cervical, axillary or inguinal LUNGS: clear to auscultation and percussion with normal breathing effort HEART: regular rate & rhythm and no murmurs and no lower extremity edema ABDOMEN:abdomen soft, non-tender and normal bowel sounds, midline incision has healed well, no discharge or skin erythema.  Musculoskeletal:no cyanosis of digits and no clubbing  PSYCH: alert & oriented x 3 with fluent speech NEURO: no focal motor/sensory deficits  LABORATORY DATA:  I have reviewed the data as listed CBC Latest Ref Rng & Units 05/10/2016 04/19/2016 03/29/2016  WBC 3.9 - 10.3 10e3/uL 4.0 3.9 4.4  Hemoglobin 11.6 - 15.9 g/dL 12.1 11.9 12.7  Hematocrit 34.8 - 46.6 % 36.9 36.1 38.3  Platelets 145 - 400 10e3/uL 149 182 208   CMP Latest Ref Rng & Units 05/10/2016 04/19/2016 03/29/2016  Glucose 70 - 140 mg/dl 109 101 104  BUN 7.0 - 26.0 mg/dL 11.8 12.3 12.8  Creatinine 0.6 - 1.1 mg/dL 0.6 0.7 0.6  Sodium 136 - 145 mEq/L 142 142 140  Potassium 3.5 - 5.1 mEq/L 3.6 3.8 4.2  Chloride 101 - 111 mmol/L - - -  CO2 22 - 29 mEq/L _1 Calcium 8.4 - 10.4 mg/dL 9.1 9.1 9.6  Total Protein 6.4 - 8.3 g/dL 5.8(L) 6.0(L) 6.5  Total Bilirubin 0.20 - 1.20 mg/dL 0.74 0.72 0.43  Alkaline Phos 40 - 150 U/L 151(H) 139 149  AST 5 - 34 U/L 29 40(H) 29  ALT 0 - 55 U/L 23 50 33   Results for CARETHA, RUMBAUGH (MRN [id number]) as of 04/19/2016 08:21  Ref. Range 02/12/2016 13:30 03/08/2016 09:51 03/29/2016 12:52  CEA ([facility]) Latest Ref Range: 0.00 - 5.00 ng/mL 1.56 1.55 2.02    PATHOLOGY REPORT  Diagnosis 01/04/2016 Colon, biopsy,  rectosigmoid junction - ADENOCARCINOMA, SEE COMMENT. Microscopic Comment Dr. Saralyn Pilar reviewed this case and concurs. The results were discussed with Dr. Watt Climes on 01/05/16. (BNS:gt, 01/05/16)  Diagnosis 01/28/2016 1. Soft tissue, biopsy, Implant on sigmoid colon - BENIGN CALCIFIED NODULE. - NO EVIDENCE OF MALIGNANCY. 2. Colon, segmental resection for tumor, Recto-sigmoid - INVASIVE COLONIC ADENOCARCINOMA, 4.5 CM  EXTENDING INTO PERICOLONIC CONNECTIVE TISSUE. - LYMPHATIC VASCULAR INVOLVEMENT BY TUMOR. - MARGINS NOT INVOLVED. - METASTATIC CARCINOMA IN 9 OF 21 LYMPH NODES (9/21). - SEE ONCOLOGY TABLE BELOW. 3. Colon, resection margin (donut), Proximal and distal of sigmoid - BENIGN PROXIMAL AND DISTAL ANASTOMATIC RINGS. - NO EVIDENCE OF MALIGNANCY. Microscopic Comment 2. COLON AND RECTUM (INCLUDING TRANS-ANAL RESECTION): Specimen: Sigmoid colon. Procedure: Segmental resection. Tumor site: Rectosigmoid. Specimen integrity: Intact. Macroscopic intactness of mesorectum: Near complete. Macroscopic tumor perforation: No. Invasive tumor: Maximum size: 4.5 cm. Histologic type(s): Colorectal adenocarcinoma. Histologic grade and differentiation: G2: moderately differentiated/low grade. Type of polyp in which invasive carcinoma arose: No residual polyp. Microscopic extension of invasive tumor: Focally into pericolonic connective tissue. Lymph-Vascular invasion: Present. Peri-neural invasion: No. Tumor deposit(s) (discontinuous extramural extension): No. Resection margins: Proximal margin: Free of tumor. Distal margin: Free of tumor. Circumferential (radial) (posterior ascending, posterior descending; lateral and posterior mid-rectum; and entire lower 1/3 rectum): Free of tumor. Mesenteric margin (sigmoid and transverse): Free of tumor. Distance closest margin (if all above margins negative): 1.5 cm from distal margin. Treatment effect (neo-adjuvant therapy): No. Additional polyp(s):  No. Non-neoplastic findings: Benign diverticulum. Lymph nodes: number examined 21; number positive: 9. Pathologic Staging: pT3, pN2b, pMX. Ancillary studies: Microsatellite instability by PCR and mismatch repair protein by immunohistochemistry pending. (JDP:ds 02/01/16) Claudette Laws MD Pathologist, Electronic Signature (Case signed 02/01/2016) Specimen Gross and Clinical Information  RADIOGRAPHIC STUDIES: I have personally reviewed the radiological images as listed and agreed with the findings in the report. No results found.   CT abdomen and pelvis with contrast 01/05/2016 IMPRESSION: 1. No dominant sigmoid colon mass identified. There is underdistention and soft tissue fullness at the rectosigmoid junction, suspicious for the primary lesion. Borderline enlarged nodes in the sigmoid mesocolon and at the origin of the inferior mesenteric artery are indeterminate. As these are in the drainage pathway for a sigmoid primary, localized nodal metastasis cannot be excluded. 2. No evidence of distant metastatic disease. 3.  Aortic atherosclerosis. 4. Uterine fibroid. 5. Left hepatic lobe giant hemangioma, as before.  CT chest 02/24/2016 IMPRESSION: 1. No evidence of pulmonary metastasis. 2. Stable large hepatic hemangioma.   Colonoscopy 01/04/2016 Dr. Watt Climes Findings: An ulcerated partially obstructing large mass was found in the rectosigmoid colon. The mass was partially circumferential, measured about 5 cm in length. Biopsy was taken. Multiple small milestone diverticula were found in the sigmoid colon. The exam otherwise was normal.   ASSESSMENT & PLAN:  65 y.o. Caucasian female, with recently diagnosed colorectal cancer   1. Colorectal cancer, invasive adenocarcinoma,pT3N2bM0, stage IIIC, MSI-stable  -Her primary cancer is located in the rectosigmoid colon. -I previously reviewed her lab findings with patient and her husband. -She has had complete surgical resection -CT scan  was negative for distant metastasis. -Giving the multiple positive lymph nodes, and locally advanced disease, she is at high risk for recurrence. -she is on adjuvant CAPOX, plan for 6 months, but OK to reduce or hold her chemo after 3 months if she has significant side effects, due to the small benefit of 6 months vs 3 months based on the recent IDEA data  -She has been tolerating chemotherapy well, has mild side effects, such as cold sensitivity, fatigue, mild nausea, manageable overall. -Lab results reviewed with patient, CBC and CMP are within normal limits, adequate for treatment, we'll proceed to cycle 4 chemotherapy today.  2. 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 This is considered a benign breast lesion, not high risk for breast cancer. -I previously used the Baker Janus model to calculate her risk of developing breast cancer, Based on her age, family history, pregnancy history, etc, her risk of developing breast cancer is 3.3% in 5 years and 11.7% in her lifetime. This is consider moderate risk. -We previously reviewed the risk factors for developing breast cancer, and strategies to prevent breast cancer, especially healthy diet, exercise, and maintain ideal weight.  -Chemoprevention with tamoxifen or anastrozole was discussed with her before, and she declined. -We discussed the breast cancer screening, including yearly screening mammogram with 3-D technology, self-exam every months and physician exam breast exam once a year.   3. Constipation -I advised the patient to take a stool softener regularly, and uses laxative as needed.  4. Nausea -I advised the patient to take her nausea medication about 30 minutes prior to eating. -I encouraged her to drink ginger tea or eat ginger candy. -better controlled lately   5. Cold sensitivity  -Secondary to oxaliplatin -She knows to  avoid cold exposure -She has very mild peripheral neuropathy, we'll continue monitoring closely.  6. HTN -Her blood pressure has been slightly elevated on her recent visits, no prior history of hypertension -I suggest her to check blood pressure at home or local pharmacy -We'll continue monitoring  Plan -Lab reviewed, adequate for treatment, we'll proceed with cycle  chemotherapy CAPOX today with full doses of oxaliplatin.  -She will return in 3 weeks for follow-up and chemo  All questions were answered. The patient knows to call the clinic with any problems, questions or concerns.  I spent 20 minutes counseling the patient face to face. The total time spent in the appointment was 25 minutes and more than 50% was on counseling.  Truitt Merle  05/10/2016

## 2016-05-11 ENCOUNTER — Telehealth: Payer: Self-pay | Admitting: *Deleted

## 2016-05-11 NOTE — Telephone Encounter (Signed)
Per 1/2 LOS I have scheduled appts and notified the scheduler

## 2016-05-18 ENCOUNTER — Telehealth: Payer: Self-pay | Admitting: *Deleted

## 2016-05-18 NOTE — Telephone Encounter (Signed)
Received message from pt wanting to talk to nurse or md about stopping treatments due to side effects.   Spoke with pt and was informed that pt still has severe cold sensitivity effects, has no energy, has no appetite but can still eat and drink fine; denied diarrhea.  Pt is currently taking Xeloda, and will complete the cycle on  Mon 05/23/16. Informed pt that message will be relayed to md.  Instructed pt to keep office visit on 05/31/16 as scheduled, and pt can discuss about continuing chemo treatments with md.  Pt voiced understanding. Pt's    Phone      [phone].

## 2016-05-18 NOTE — Telephone Encounter (Signed)
Please let her know that I will likely decrease her iv chemo dose or cancel it, but want her to continue oral chemo Xeloda for future cycles. I will discuss with her further on her next visit. Call us if needs IVF or see [place] before next visit.   Truitt Merle MD

## 2016-05-19 NOTE — Telephone Encounter (Signed)
Spoke with pt and informed pt of Dr. Ernestina Penna instructions.  Pt understood to call office early am if she decides needing IVF.  Confirmed that pt will keep scheduled appt on 05/31/16 .

## 2016-05-30 NOTE — Progress Notes (Signed)
[place]  [phone] [phone]  Clinic Follow up Note   Patient Care Team: [name], MD as PCP - General (Family Medicine) Autumn Messing III, MD as Consulting Physician (General Surgery) 05/31/2016   CHIEF COMPLAINTS:  Recently diagnosed stage III colon cancer   Oncology History   Rectal cancer [facility])   Staging form: Colon and Rectum, AJCC 7th Edition   - Pathologic stage from 01/28/2016: Stage IIIC (T3, N2b, cM0) - Signed by Truitt Merle, MD on 02/12/2016      Cancer of left colon ([place])   01/04/2016 Procedure    Colonoscopy by Dr. Estanislado Emms showed a partially obstructing tumor in the rectal sigmoid colon, biopsied.      01/05/2016 Imaging    CT abdomen and pelvis with contrast showed no dominant sigmoid colon mass, soft tissue fullness at the rectosigmoid junction, suspicious for the primary lesion. Borderline enlarged nodes in the sigmoid mesocolon and at the origin of the inferior mesenteric artery are indeterminate no evidence of December metastasis. Left hepatic lobe giant hemangioma.       01/28/2016 Initial Diagnosis    Rectal cancer ([place])     01/28/2016 Surgery    Endoscopic low anterior resection of rectosigmoid segmental colon for tumor       01/28/2016 Pathology Results    Invasive colonic adenocarcinoma, 4.5 cm extending into her chronic connective tissue, lymphovascular involvement by tumor, margins were negative, metastatic carcinoma in 9 of 21 lymph nodes.      02/24/2016 Imaging    CT chest w/o contrast IMPRESSION: 1. No evidence of pulmonary metastasis. 2. Stable large hepatic hemangioma.       HISTORY OF PRESENTING ILLNESS (05/14/2015):  Bethany Frederick 66 y.o. female is here to discussed her risk of breast cancer.   She has had palpable left breast mass for over 20 years, unchanged. Her mammogram in 2014 showed a 0.8 cm cyst. She does screening mammogram once a year. The lesion looks slightly more suspicious on the  mammogram in August 2016, and a biopsy was recommended, which showed sclerosing lesion with typical ductal hyperplasia. She was referred to see Dr. Marlou Starks and underwent left breast lumpectomy. She tolerated the surgery very well, and has recovered completely.  She feels well, has mild fatigue, she takes care two of her brothers, who live alone and have health issues. She lives with her hsuband and 2 yo son. She remains physically active, does not exercise regularly. She has mild arthritis, takes ibuprofen and Aleve as needed, no other complaints.  GYN HISTORY  Menarchal: 10 LMP: 65 Contraceptive: 23 years  HRT: 5 years, stopped at age of 21  G1P1: one son, she was 17 yo when she had her son   CURRENT THERAPY: Adjuvant chemotherapy with CAPOX (Xeloda 2000 mg twice daily, on day 1-14, oxaliplatin 149m/m2 on day 1) every 3 weeks, started 03/08/2016  IHaworthreturns for follow-up. Before her last cycle of chemotherapy, she decided to stop chemo because of how it made her feel. She has completed 4 cycles. She says she feels a little better, but still feels very weak. After her last cycle of chemo she felt very sick. She had no appetite, felt weak, tired, nauseous, constipated, and very cold. She still has some nausea, but not as much as previously. She doesn't have much of an appetite, but makes herself eat. She was taking the Xeloda, but her pharmacy didn't refill it, so she was unsure whether she  was suppose to continue it. She has lost 4 lbs in 3 weeks. She still has some numbness in her hands, but the tingling is gone. She drops things. She tries to get up and do things throughout the day, but she gets very tired and ends up sitting down again.  Denies any other concerns.   MEDICAL HISTORY:  Past Medical History:  Diagnosis Date  . Arthritis    back, hip, feet  . Cancer ([place])    Sigmoid colon  . Cataract, immature   . GERD (gastroesophageal reflux disease)   . Heart palpitations    . 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 Seasonal allergies   . Wears partial dentures    upper front - 1 tooth    SURGICAL HISTORY: Past Surgical History:  Procedure Laterality Date  . APPENDECTOMY    . BREAST LUMPECTOMY WITH RADIOACTIVE SEED LOCALIZATION Left 03/13/2015   Procedure: BREAST LUMPECTOMY WITH RADIOACTIVE SEED LOCALIZATION;  Surgeon: Autumn Messing III, MD;  Location: [place];  Service: General;  Laterality: Left;  . COLONOSCOPY    . ENDOMETRIAL FULGURATION  06/13/2000  . INGUINAL HERNIA REPAIR  03/05/2012   Procedure: HERNIA REPAIR INGUINAL INCARCERATED;  Surgeon: Ralene Ok, MD;  Location: [place];  Service: General;  Laterality: N/A;  . INSERTION OF MESH  03/05/2012   Procedure: INSERTION OF MESH;  Surgeon: Ralene Ok, MD;  Location: [place];  Service: General;  Laterality: Left;  . LAPAROSCOPIC SIGMOID COLECTOMY Bilateral 01/28/2016   Procedure: LAPAROSCOPIC ASSISTED SIGMOID COLECTOMY;  Surgeon: Autumn Messing III, MD;  Location: [place];  Service: General;  Laterality: Bilateral;  . MYOMECTOMY  06/13/2000  . PORTACATH PLACEMENT N/A 02/18/2016   Procedure: INSERTION PORT-A-CATH;  Surgeon: Autumn Messing III, MD;  Location: [place];  Service: General;  Laterality: N/A;  . ROTATOR CUFF REPAIR Left    2013    SOCIAL HISTORY: Social History   Social History  . Marital status: Married    Spouse name: N/A  . Number of children: N/A  . Years of education: N/A   Occupational History  . Not on file.   Social History Main Topics  . Smoking status: Never Smoker  . Smokeless tobacco: Never Used  . Alcohol use Yes     Comment: occasionally  . Drug use: No  . Sexual activity: Not Currently   Other Topics Concern  . Not on file   Social History Narrative  . No narrative on file    FAMILY HISTORY: Family History  Problem Relation Age of Onset  . Cancer Brother 26    prostate cancer   . Cancer Paternal Aunt 77    breast cancer    . Cancer Cousin 40    ovarian and breast cancer (maternal cousin)     ALLERGIES:  is allergic to no known allergies.  MEDICATIONS:  Current Outpatient Prescriptions  Medication Sig Dispense Refill  . clobetasol cream (TEMOVATE) 7.32 % Apply 1 application topically daily as needed (RASH).     . famotidine (PEPCID) 20 MG tablet Take 20 mg by mouth 2 (two) times daily as needed for heartburn or indigestion.   0  . lidocaine-prilocaine (EMLA) cream Apply 1 application topically as needed. Apply to port site 1 hour prior to stick and cover w/plastic wrap 30 g 11  . ondansetron (ZOFRAN) 8 MG tablet Take 1 tablet (8 mg total) by mouth 2 (two) times daily as needed for refractory nausea / vomiting. Start  on day 3 after chemotherapy. 30 tablet 1  . prochlorperazine (COMPAZINE) 10 MG tablet Take 1 tablet (10 mg total) by mouth every 6 (six) hours as needed (Nausea or vomiting). 30 tablet 1  . capecitabine (XELODA) 500 MG tablet Take 4 tablets (2,000 mg total) by mouth 2 (two) times daily after a meal. Take on days 1-14 of chemotherapy. 112 tablet 1  . HYDROcodone-acetaminophen (NORCO/VICODIN) 5-325 MG tablet Take 1-2 tablets by mouth every 4 (four) hours as needed for moderate pain or severe pain. (Patient not taking: Reported on 05/10/2016) 20 tablet 0   Current Facility-Administered Medications  Medication Dose Route Frequency Provider Last Rate Last Dose  . heparin lock flush 100 unit/mL  500 Units Intravenous Once PRN Truitt Merle, MD       Facility-Administered Medications Ordered in Other Visits  Medication Dose Route Frequency Provider Last Rate Last Dose  . sodium chloride flush (NS) 0.9 % injection 10 mL  10 mL Intravenous PRN Truitt Merle, MD   10 mL at 05/31/16 1303    REVIEW OF SYSTEMS: Constitutional: Denies fevers, chills or abnormal night sweats (+) loss of appetite, tired Eyes: Denies blurriness of vision, double vision or watery eyes Ears, nose, mouth, throat, and face: Denies mucositis  or sore throat Respiratory: Denies cough, dyspnea or wheezes Cardiovascular: Denies palpitation, chest discomfort or lower extremity swelling Gastrointestinal: (+) nausea. Skin: Denies abnormal skin rashes.  Lymphatics: Denies new lymphadenopathy or easy bruising Neurological:Denies numbness, tingling (+) weakness Behavioral/Psych: Mood is stable, no new changes  All other systems were reviewed with the patient and are negative.  PHYSICAL EXAMINATION: ECOG PERFORMANCE STATUS: 1  BP 133/70 (BP Location: Left Arm, Patient Position: Sitting)   Pulse 64   Temp 97.7 F (36.5 C) (Oral)   Resp 18   Ht _0  (1.702 m)   Wt 184 lb 1.6 oz (83.5 kg)   SpO2 99%   BMI 28.83 kg/m    GENERAL:alert, no distress and comfortable  SKIN: skin color, texture, turgor are normal, no rashes or significant lesions EYES: normal, conjunctiva are pink and non-injected, sclera clear OROPHARYNX:no exudate, no erythema and lips, buccal mucosa, and tongue normal  NECK: supple, thyroid normal size, non-tender, without nodularity LYMPH:  no palpable lymphadenopathy in the cervical, axillary or inguinal LUNGS: clear to auscultation and percussion with normal breathing effort HEART: regular rate & rhythm and no murmurs and no lower extremity edema ABDOMEN:abdomen soft, non-tender and normal bowel sounds, midline incision has healed well, no discharge or skin erythema.  Musculoskeletal:no cyanosis of digits and no clubbing  PSYCH: alert & oriented x 3 with fluent speech NEURO: no focal motor/sensory deficits  LABORATORY DATA:  I have reviewed the data as listed CBC Latest Ref Rng & Units 05/31/2016 05/10/2016 04/19/2016  WBC 3.9 - 10.3 10e3/uL 3.9 4.0 3.9  Hemoglobin 11.6 - 15.9 g/dL 11.7 12.1 11.9  Hematocrit 34.8 - 46.6 % 35.2 36.9 36.1  Platelets 145 - 400 10e3/uL 131(L) 149 182   CMP Latest Ref Rng & Units 05/31/2016 05/10/2016 04/19/2016  Glucose 70 - 140 mg/dl 100 109 101  BUN 7.0 - 26.0 mg/dL 12.9 11.8  12.3  Creatinine 0.6 - 1.1 mg/dL 0.6 0.6 0.7  Sodium 136 - 145 mEq/L 143 142 142  Potassium 3.5 - 5.1 mEq/L 3.4(L) 3.6 3.8  Chloride 101 - 111 mmol/L - - -  CO2 22 - 29 mEq/L _1 Calcium 8.4 - 10.4 mg/dL 9.0 9.1 9.1  Total Protein 6.4 -  8.3 g/dL 5.6(L) 5.8(L) 6.0(L)  Total Bilirubin 0.20 - 1.20 mg/dL 1.06 0.74 0.72  Alkaline Phos 40 - 150 U/L 157(H) 151(H) 139  AST 5 - 34 U/L 29 29 40(H)  ALT 0 - 55 U/L 18 23 50   Results for ONETHA, GAFFEY (MRN [id number]) as of 05/31/2016 13:25  Ref. Range 02/12/2016 13:30 03/08/2016 09:51 03/29/2016 12:52 05/10/2016 12:20  CEA ([facility]) Latest Ref Range: 0.00 - 5.00 ng/mL 1.56 1.55 2.02 4.01    PATHOLOGY REPORT  Diagnosis 01/04/2016 Colon, biopsy, rectosigmoid junction - ADENOCARCINOMA, SEE COMMENT. Microscopic Comment Dr. Saralyn Pilar reviewed this case and concurs. The results were discussed with Dr. Watt Climes on 01/05/16. (BNS:gt, 01/05/16)  Diagnosis 01/28/2016 1. Soft tissue, biopsy, Implant on sigmoid colon - BENIGN CALCIFIED NODULE. - NO EVIDENCE OF MALIGNANCY. 2. Colon, segmental resection for tumor, Recto-sigmoid - INVASIVE COLONIC ADENOCARCINOMA, 4.5 CM EXTENDING INTO PERICOLONIC CONNECTIVE TISSUE. - LYMPHATIC VASCULAR INVOLVEMENT BY TUMOR. - MARGINS NOT INVOLVED. - METASTATIC CARCINOMA IN 9 OF 21 LYMPH NODES (9/21). - SEE ONCOLOGY TABLE BELOW. 3. Colon, resection margin (donut), Proximal and distal of sigmoid - BENIGN PROXIMAL AND DISTAL ANASTOMATIC RINGS. - NO EVIDENCE OF MALIGNANCY. Microscopic Comment 2. COLON AND RECTUM (INCLUDING TRANS-ANAL RESECTION): Specimen: Sigmoid colon. Procedure: Segmental resection. Tumor site: Rectosigmoid. Specimen integrity: Intact. Macroscopic intactness of mesorectum: Near complete. Macroscopic tumor perforation: No. Invasive tumor: Maximum size: 4.5 cm. Histologic type(s): Colorectal adenocarcinoma. Histologic grade and differentiation: G2: moderately differentiated/low grade. Type of  polyp in which invasive carcinoma arose: No residual polyp. Microscopic extension of invasive tumor: Focally into pericolonic connective tissue. Lymph-Vascular invasion: Present. Peri-neural invasion: No. Tumor deposit(s) (discontinuous extramural extension): No. Resection margins: Proximal margin: Free of tumor. Distal margin: Free of tumor. Circumferential (radial) (posterior ascending, posterior descending; lateral and posterior mid-rectum; and entire lower 1/3 rectum): Free of tumor. Mesenteric margin (sigmoid and transverse): Free of tumor. Distance closest margin (if all above margins negative): 1.5 cm from distal margin. Treatment effect (neo-adjuvant therapy): No. Additional polyp(s): No. Non-neoplastic findings: Benign diverticulum. Lymph nodes: number examined 21; number positive: 9. Pathologic Staging: pT3, pN2b, pMX. Ancillary studies: Microsatellite instability by PCR and mismatch repair protein by immunohistochemistry pending. (JDP:ds 02/01/16) Claudette Laws MD Pathologist, Electronic Signature (Case signed 02/01/2016) Specimen Gross and Clinical Information  RADIOGRAPHIC STUDIES: I have personally reviewed the radiological images as listed and agreed with the findings in the report. No results found.   CT abdomen and pelvis with contrast 01/05/2016 IMPRESSION: 1. No dominant sigmoid colon mass identified. There is underdistention and soft tissue fullness at the rectosigmoid junction, suspicious for the primary lesion. Borderline enlarged nodes in the sigmoid mesocolon and at the origin of the inferior mesenteric artery are indeterminate. As these are in the drainage pathway for a sigmoid primary, localized nodal metastasis cannot be excluded. 2. No evidence of distant metastatic disease. 3.  Aortic atherosclerosis. 4. Uterine fibroid. 5. Left hepatic lobe giant hemangioma, as before.  CT chest 02/24/2016 IMPRESSION: 1. No evidence of pulmonary metastasis. 2.  Stable large hepatic hemangioma.   Colonoscopy 01/04/2016 Dr. Watt Climes Findings: An ulcerated partially obstructing large mass was found in the rectosigmoid colon. The mass was partially circumferential, measured about 5 cm in length. Biopsy was taken. Multiple small milestone diverticula were found in the sigmoid colon. The exam otherwise was normal.   ASSESSMENT & PLAN:  66 y.o. Caucasian female, with recently diagnosed colorectal cancer   1. Colorectal cancer, invasive adenocarcinoma,pT3N2bM0, stage IIIC, MSI-stable  -Her primary cancer is located  in the rectosigmoid colon. -I previously reviewed her lab findings with patient and her husband. -She has had complete surgical resection -Previous CT scan was negative for distant metastasis. -Giving the multiple positive lymph nodes, and locally advanced disease, she is at high risk for recurrence. -she is on adjuvant CAPOX, plan for 6 months, but OK to reduce or hold her chemo after 3 months if she has significant side effects, due to the small benefit of 6 months vs 3 months based on the recent IDEA data  -She tolerated cycle 4 chemotherapy poorly, had a significant prolonged fatigue, anorexia, constipation and cold sensitivity and requested de-escalation of her chemotherapy. -I'll hold oxaliplatin from now, she'll continue Xeloda for 2-3 more cycles  -Lab results reviewed with patient, CBC and CMP are within normal limits, adequate for treatment -I'll give her 1 more week to recover, she will start cycle 5 Xeloda in 1 week.  2. 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 This is considered a benign breast lesion, not high risk for breast cancer. -I previously used the Baker Janus model to calculate her risk of developing breast cancer, Based on her age, family history, pregnancy history, etc, her risk of developing breast cancer is 3.3% in  5 years and 11.7% in her lifetime. This is consider moderate risk. -We previously reviewed the risk factors for developing breast cancer, and strategies to prevent breast cancer, especially healthy diet, exercise, and maintain ideal weight.  -Chemoprevention with tamoxifen or anastrozole was discussed with her before, and she declined. -We previously discussed the breast cancer screening, including yearly screening mammogram with 3-D technology, self-exam every months and physician exam breast exam once a year.   3. Constipation -I advised the patient to take a stool softener regularly, and uses laxative as needed.  4. Nausea -I advised the patient to take her nausea medication about 30 minutes prior to eating. -I encouraged her to drink ginger tea or eat ginger candy. -better controlled lately   5. Cold sensitivity  -Secondary to oxaliplatin -She knows to avoid cold exposure -She has very mild peripheral neuropathy, we'll continue monitoring closely.  6. HTN -Her blood pressure has been slightly elevated on her recent visits, no prior history of hypertension -I suggest her to check blood pressure at home or local pharmacy -We'll continue monitoring  Plan -Lab reviewed, adequate for treatment. We will stop oxaliplatin from now, due to her poor tolerance. -Continue Xeloda for 2-3 more cycles. Start again in 1 week.  -She will return in 4 weeks for follow-up.  All questions were answered. The patient knows to call the clinic with any problems, questions or concerns.  I spent 20 minutes counseling the patient face to face. The total time spent in the appointment was 25 minutes and more than 50% was on counseling.  This document serves as a record of services personally performed by Truitt Merle, MD. It was created on her behalf by Martinique Casey, a trained medical scribe. The creation of this record is based on the scribe's personal observations and the provider's statements to them. This  document has been checked and approved by the attending provider.  I have reviewed the above documentation for accuracy and completeness, and I agree with the above information.      Truitt Merle  05/31/2016

## 2016-05-31 ENCOUNTER — Ambulatory Visit: Payer: Federal, State, Local not specified - PPO

## 2016-05-31 ENCOUNTER — Ambulatory Visit (HOSPITAL_BASED_OUTPATIENT_CLINIC_OR_DEPARTMENT_OTHER): Payer: Federal, State, Local not specified - PPO | Admitting: Hematology

## 2016-05-31 ENCOUNTER — Telehealth: Payer: Self-pay | Admitting: Hematology

## 2016-05-31 ENCOUNTER — Other Ambulatory Visit (HOSPITAL_BASED_OUTPATIENT_CLINIC_OR_DEPARTMENT_OTHER): Payer: Federal, State, Local not specified - PPO

## 2016-05-31 ENCOUNTER — Encounter: Payer: Self-pay | Admitting: Hematology

## 2016-05-31 VITALS — BP 133/70 | HR 64 | Temp 97.7°F | Resp 18 | Ht 67.0 in | Wt 184.1 lb

## 2016-05-31 DIAGNOSIS — K5900 Constipation, unspecified: Secondary | ICD-10-CM

## 2016-05-31 DIAGNOSIS — G620 Drug-induced polyneuropathy: Secondary | ICD-10-CM | POA: Diagnosis not present

## 2016-05-31 DIAGNOSIS — Z95828 Presence of other vascular implants and grafts: Secondary | ICD-10-CM

## 2016-05-31 DIAGNOSIS — C19 Malignant neoplasm of rectosigmoid junction: Secondary | ICD-10-CM | POA: Diagnosis not present

## 2016-05-31 DIAGNOSIS — N6092 Unspecified benign mammary dysplasia of left breast: Secondary | ICD-10-CM | POA: Diagnosis not present

## 2016-05-31 DIAGNOSIS — C186 Malignant neoplasm of descending colon: Secondary | ICD-10-CM

## 2016-05-31 DIAGNOSIS — C779 Secondary and unspecified malignant neoplasm of lymph node, unspecified: Secondary | ICD-10-CM

## 2016-05-31 DIAGNOSIS — C20 Malignant neoplasm of rectum: Secondary | ICD-10-CM

## 2016-05-31 DIAGNOSIS — R110 Nausea: Secondary | ICD-10-CM

## 2016-05-31 DIAGNOSIS — I10 Essential (primary) hypertension: Secondary | ICD-10-CM

## 2016-05-31 LAB — COMPREHENSIVE METABOLIC PANEL
ALK PHOS: 157 U/L — AB (ref 40–150)
ALT: 18 U/L (ref 0–55)
AST: 29 U/L (ref 5–34)
Albumin: 3.4 g/dL — ABNORMAL LOW (ref 3.5–5.0)
Anion Gap: 9 mEq/L (ref 3–11)
BUN: 12.9 mg/dL (ref 7.0–26.0)
CALCIUM: 9 mg/dL (ref 8.4–10.4)
CHLORIDE: 111 meq/L — AB (ref 98–109)
CO2: 23 mEq/L (ref 22–29)
Creatinine: 0.6 mg/dL (ref 0.6–1.1)
Glucose: 100 mg/dl (ref 70–140)
POTASSIUM: 3.4 meq/L — AB (ref 3.5–5.1)
SODIUM: 143 meq/L (ref 136–145)
Total Bilirubin: 1.06 mg/dL (ref 0.20–1.20)
Total Protein: 5.6 g/dL — ABNORMAL LOW (ref 6.4–8.3)

## 2016-05-31 LAB — CBC WITH DIFFERENTIAL/PLATELET
BASO%: 2.2 % — ABNORMAL HIGH (ref 0.0–2.0)
Basophils Absolute: 0.1 10*3/uL (ref 0.0–0.1)
EOS ABS: 0.1 10*3/uL (ref 0.0–0.5)
EOS%: 2.4 % (ref 0.0–7.0)
HEMATOCRIT: 35.2 % (ref 34.8–46.6)
HGB: 11.7 g/dL (ref 11.6–15.9)
LYMPH%: 37 % (ref 14.0–49.7)
MCH: 30.4 pg (ref 25.1–34.0)
MCHC: 33.1 g/dL (ref 31.5–36.0)
MCV: 92 fL (ref 79.5–101.0)
MONO#: 0.7 10*3/uL (ref 0.1–0.9)
MONO%: 17.4 % — AB (ref 0.0–14.0)
NEUT#: 1.6 10*3/uL (ref 1.5–6.5)
NEUT%: 41 % (ref 38.4–76.8)
Platelets: 131 10*3/uL — ABNORMAL LOW (ref 145–400)
RBC: 3.83 10*6/uL (ref 3.70–5.45)
RDW: 22.2 % — ABNORMAL HIGH (ref 11.2–14.5)
WBC: 3.9 10*3/uL (ref 3.9–10.3)
lymph#: 1.5 10*3/uL (ref 0.9–3.3)

## 2016-05-31 MED ORDER — HEPARIN SOD (PORK) LOCK FLUSH 100 UNIT/ML IV SOLN
500.0000 [IU] | Freq: Once | INTRAVENOUS | Status: DC | PRN
Start: 2016-05-31 — End: 2016-05-31
  Filled 2016-05-31: qty 5

## 2016-05-31 MED ORDER — SODIUM CHLORIDE 0.9% FLUSH
10.0000 mL | INTRAVENOUS | Status: AC | PRN
  Administered 2016-05-31: 10 mL via INTRAVENOUS
  Filled 2016-05-31: qty 10

## 2016-05-31 MED ORDER — CAPECITABINE 500 MG PO TABS: 1000.0000 mg/m2 | ORAL_TABLET | Freq: Two times a day (BID) | ORAL | 1 refills | Status: DC

## 2016-05-31 NOTE — Telephone Encounter (Signed)
Treatment , labs, flush and follow up appointments for 06/21/16 was cancelled per Dr Burr Medico (verbal).  Appointments scheduled per 05/31/16 los. Patient was given a copy of the appointment schedule and AVS report, per 05/31/16 los.

## 2016-05-31 NOTE — Patient Instructions (Signed)

## 2016-06-03 ENCOUNTER — Other Ambulatory Visit: Payer: Self-pay | Admitting: *Deleted

## 2016-06-03 DIAGNOSIS — C19 Malignant neoplasm of rectosigmoid junction: Secondary | ICD-10-CM

## 2016-06-03 MED ORDER — CAPECITABINE 500 MG PO TABS: 1000.0000 mg/m2 | ORAL_TABLET | Freq: Two times a day (BID) | ORAL | 1 refills | Status: DC

## 2016-06-17 ENCOUNTER — Other Ambulatory Visit: Payer: Self-pay | Admitting: Hematology

## 2016-06-21 ENCOUNTER — Other Ambulatory Visit: Payer: Federal, State, Local not specified - PPO

## 2016-06-21 ENCOUNTER — Ambulatory Visit: Payer: Federal, State, Local not specified - PPO | Admitting: Hematology

## 2016-06-21 ENCOUNTER — Ambulatory Visit: Payer: Federal, State, Local not specified - PPO

## 2016-06-28 NOTE — Progress Notes (Signed)
[place]  [phone] [phone]  Clinic Follow up Note   Patient Care Team: [name], MD as PCP - General (Family Medicine) Autumn Messing III, MD as Consulting Physician (General Surgery) 06/29/2016   CHIEF COMPLAINTS:  Recently diagnosed stage III colon cancer   Oncology History   Rectal cancer [facility])   Staging form: Colon and Rectum, AJCC 7th Edition   - Pathologic stage from 01/28/2016: Stage IIIC (T3, N2b, cM0) - Signed by Truitt Merle, MD on 02/12/2016      Cancer of left colon ([place])   01/04/2016 Procedure    Colonoscopy by Dr. Estanislado Emms showed a partially obstructing tumor in the rectal sigmoid colon, biopsied.      01/05/2016 Imaging    CT abdomen and pelvis with contrast showed no dominant sigmoid colon mass, soft tissue fullness at the rectosigmoid junction, suspicious for the primary lesion. Borderline enlarged nodes in the sigmoid mesocolon and at the origin of the inferior mesenteric artery are indeterminate no evidence of December metastasis. Left hepatic lobe giant hemangioma.       01/28/2016 Initial Diagnosis    Rectal cancer ([place])     01/28/2016 Surgery    Endoscopic low anterior resection of rectosigmoid segmental colon for tumor       01/28/2016 Pathology Results    Invasive colonic adenocarcinoma, 4.5 cm extending into Bethany Frederick chronic connective tissue, lymphovascular involvement by tumor, margins were negative, metastatic carcinoma in 9 of 21 lymph nodes.      02/24/2016 Imaging    CT chest w/o contrast IMPRESSION: 1. No evidence of pulmonary metastasis. 2. Stable large hepatic hemangioma.       HISTORY OF PRESENTING ILLNESS (05/14/2015):  Bethany Frederick 66 y.o. female is here to discussed Bethany Frederick risk of breast cancer.   She has had palpable left breast mass for over 20 years, unchanged. Bethany Frederick mammogram in 2014 showed a 0.8 cm cyst. She does screening mammogram once a year. The lesion looks slightly more suspicious on the  mammogram in August 2016, and a biopsy was recommended, which showed sclerosing lesion with typical ductal hyperplasia. She was referred to see Dr. Marlou Starks and underwent left breast lumpectomy. She tolerated the surgery very well, and has recovered completely.  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 She lives with Bethany Frederick hsuband and 60 yo son. She remains physically active, does not exercise regularly. She has mild arthritis, takes ibuprofen and Aleve as needed, no other complaints.  GYN HISTORY  Menarchal: 10 LMP: 30 Contraceptive: 23 years  HRT: 5 years, stopped at age of 24  G1P1: one son, she was 1 yo when she had Bethany Frederick son   CURRENT THERAPY: Adjuvant chemotherapy with CAPOX (Xeloda 2000 mg twice daily, on day 1-14, oxaliplatin 12m/m2 on day 1) every 3 weeks, started 03/08/2016, Oxaliplatin held after 4 cycles due to poor tolerance   INTERIM HISTORY  [name] for follow-up. She reports feeling well overall, and finds that she can do most everything she wants to. She reports some neuropathy in Bethany Frederick hands with numbness and soreness. She reports difficulty picking up very small things.   MEDICAL HISTORY:  Past Medical History:  Diagnosis Date  . Arthritis    back, hip, feet  . [place]    Sigmoid colon  . Cataract, immature   . GERD (gastroesophageal reflux disease)   . Heart palpitations   . 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 Seasonal allergies   . Wears partial dentures    upper front - 1 tooth    SURGICAL HISTORY: Past Surgical History:  Procedure Laterality Date  . APPENDECTOMY    . BREAST LUMPECTOMY WITH RADIOACTIVE SEED LOCALIZATION Left 03/13/2015   Procedure: BREAST LUMPECTOMY WITH RADIOACTIVE SEED LOCALIZATION;  Surgeon: Chevis Pretty III, MD;  Location: [facility];  Service: General;  Laterality: Left;  . COLONOSCOPY    . ENDOMETRIAL FULGURATION  06/13/2000  . INGUINAL HERNIA  REPAIR  03/05/2012   Procedure: HERNIA REPAIR INGUINAL INCARCERATED;  Surgeon: Axel Filler, MD;  Location: [facility] OR;  Service: General;  Laterality: N/A;  . INSERTION OF MESH  03/05/2012   Procedure: INSERTION OF MESH;  Surgeon: Axel Filler, MD;  Location: [facility] OR;  Service: General;  Laterality: Left;  . LAPAROSCOPIC SIGMOID COLECTOMY Bilateral 01/28/2016   Procedure: LAPAROSCOPIC ASSISTED SIGMOID COLECTOMY;  Surgeon: Chevis Pretty III, MD;  Location: [facility] OR;  Service: General;  Laterality: Bilateral;  . MYOMECTOMY  06/13/2000  . PORTACATH PLACEMENT N/A 02/18/2016   Procedure: INSERTION PORT-A-CATH;  Surgeon: Chevis Pretty III, MD;  Location: [facility];  Service: General;  Laterality: N/A;  . ROTATOR CUFF REPAIR Left    2013    SOCIAL HISTORY: Social History   Social History  . Marital status: Married    Spouse name: N/A  . Number of children: N/A  . Years of education: N/A   Occupational History  . Not on file.   Social History Main Topics  . Smoking status: Never Smoker  . Smokeless tobacco: Never Used  . Alcohol use Yes     Comment: occasionally  . Drug use: No  . Sexual activity: Not Currently   Other Topics Concern  . Not on file   Social History Narrative  . No narrative on file    FAMILY HISTORY: Family History  Problem Relation Age of Onset  . Cancer Brother 83    prostate cancer   . Cancer Paternal Aunt 72    breast cancer   . Cancer Cousin 40    ovarian and breast cancer (maternal cousin)     ALLERGIES:  is allergic to no known allergies.  MEDICATIONS:  Current Outpatient Prescriptions  Medication Sig Dispense Refill  . capecitabine (XELODA) 500 MG tablet Take 4 tablets (2,000 mg total) by mouth 2 (two) times daily after a meal. Take on days 1-14 of chemotherapy. 112 tablet 1  . clobetasol cream (TEMOVATE) 0.05 % Apply 1 application topically daily as needed (RASH).     . famotidine (PEPCID) 20 MG tablet Take 20 mg by mouth 2 (two) times  daily as needed for heartburn or indigestion.   0  . HYDROcodone-acetaminophen (NORCO/VICODIN) 5-325 MG tablet Take 1-2 tablets by mouth every 4 (four) hours as needed for moderate pain or severe pain. (Patient not taking: Reported on 05/10/2016) 20 tablet 0  . lidocaine-prilocaine (EMLA) cream Apply 1 application topically as needed. Apply to port site 1 hour prior to stick and cover w/plastic wrap 30 g 11  . ondansetron (ZOFRAN) 8 MG tablet Take 1 tablet (8 mg total) by mouth 2 (two) times daily as needed for refractory nausea / vomiting. Start on day 3 after chemotherapy. 30 tablet 1  . prochlorperazine (COMPAZINE) 10 MG tablet Take 1 tablet (10 mg total) by mouth every 6 (six) hours as needed (Nausea or vomiting). 30 tablet 1   No current facility-administered medications for this  visit.    Facility-Administered Medications Ordered in Other Visits  Medication Dose Route Frequency Provider Last Rate Last Dose  . sodium chloride flush (NS) 0.9 % injection 10 mL  10 mL Intravenous PRN Truitt Merle, MD   10 mL at 05/31/16 1303    REVIEW OF SYSTEMS: Constitutional: Denies fevers, chills or abnormal night sweats (+) increase in energy Eyes: Denies blurriness of vision, double vision or watery eyes Ears, nose, mouth, throat, and face: Denies mucositis or sore throat Respiratory: Denies cough, dyspnea or wheezes Cardiovascular: Denies palpitation, chest discomfort or lower extremity swelling Gastrointestinal: (+) nausea. Skin: Denies abnormal skin rashes.  Lymphatics: Denies new lymphadenopathy or easy bruising Neurological: (+) weakness, (+) numbness in fingertips, (+) neuropathy in fingertips and toes Behavioral/Psych: Mood is stable, no new changes  All other systems were reviewed with the patient and are negative.  PHYSICAL EXAMINATION: ECOG PERFORMANCE STATUS: 1  BP (!) 134/59 (BP Location: Left Arm, Patient Position: Sitting)   Pulse 62   Temp 98.2 F (36.8 C) (Oral)   Resp 18   Ht 5'  7" (1.702 m)   Wt 181 lb 14.4 oz (82.5 kg)   SpO2 99%   BMI 28.49 kg/m    GENERAL:alert, no distress and comfortable  SKIN: skin color, texture, turgor are normal, no rashes or significant lesions EYES: normal, conjunctiva are pink and non-injected, sclera clear OROPHARYNX:no exudate, no erythema and lips, buccal mucosa, and tongue normal  NECK: supple, thyroid normal size, non-tender, without nodularity LYMPH:  no palpable lymphadenopathy in the cervical, axillary or inguinal LUNGS: clear to auscultation and percussion with normal breathing effort HEART: regular rate & rhythm and no murmurs and no lower extremity edema ABDOMEN:abdomen soft, non-tender and normal bowel sounds, midline incision has healed well, no discharge or skin erythema.  Musculoskeletal:no cyanosis of digits and no clubbing  PSYCH: alert & oriented x 3 with fluent speech NEURO: no focal motor/sensory deficits  LABORATORY DATA:  I have reviewed the data as listed CBC Latest Ref Rng & Units 06/29/2016 05/31/2016 05/10/2016  WBC 3.9 - 10.3 10e3/uL 4.5 3.9 4.0  Hemoglobin 11.6 - 15.9 g/dL 12.5 11.7 12.1  Hematocrit 34.8 - 46.6 % 37.7 35.2 36.9  Platelets 145 - 400 10e3/uL 168 131(L) 149   CMP Latest Ref Rng & Units 06/29/2016 05/31/2016 05/10/2016  Glucose 70 - 140 mg/dl 111 100 109  BUN 7.0 - 26.0 mg/dL 11.1 12.9 11.8  Creatinine 0.6 - 1.1 mg/dL 0.6 0.6 0.6  Sodium 136 - 145 mEq/L 141 143 142  Potassium 3.5 - 5.1 mEq/L 4.0 3.4(L) 3.6  Chloride 101 - 111 mmol/L - - -  CO2 22 - 29 mEq/L _0 Calcium 8.4 - 10.4 mg/dL 9.4 9.0 9.1  Total Protein 6.4 - 8.3 g/dL 6.1(L) 5.6(L) 5.8(L)  Total Bilirubin 0.20 - 1.20 mg/dL 0.87 1.06 0.74  Alkaline Phos 40 - 150 U/L 247(H) 157(H) 151(H)  AST 5 - 34 U/L _1 ALT 0 - 55 U/L _2 Results for SHENICKA, SUNDERLIN (MRN [id number]) as of 05/31/2016 13:25  Ref. Range 02/12/2016 13:30 03/08/2016 09:51 03/29/2016 12:52 05/10/2016 12:20  CEA ([facility]) Latest Ref Range: 0.00  - 5.00 ng/mL 1.56 1.55 2.02 4.01    PATHOLOGY REPORT  Diagnosis 01/04/2016 Colon, biopsy, rectosigmoid junction - ADENOCARCINOMA, SEE COMMENT.  Diagnosis 01/28/2016 1. Soft tissue, biopsy, Implant on sigmoid colon - BENIGN CALCIFIED NODULE. - NO EVIDENCE OF MALIGNANCY. 2. Colon, segmental resection for  tumor, Recto-sigmoid - INVASIVE COLONIC ADENOCARCINOMA, 4.5 CM EXTENDING INTO PERICOLONIC CONNECTIVE TISSUE. - LYMPHATIC VASCULAR INVOLVEMENT BY TUMOR. - MARGINS NOT INVOLVED. - METASTATIC CARCINOMA IN 9 OF 21 LYMPH NODES (9/21). - SEE ONCOLOGY TABLE BELOW. 3. Colon, resection margin (donut), Proximal and distal of sigmoid - BENIGN PROXIMAL AND DISTAL ANASTOMATIC RINGS. - NO EVIDENCE OF MALIGNANCY. Specimen Gross and Clinical Information  RADIOGRAPHIC STUDIES: I have personally reviewed the radiological images as listed and agreed with the findings in the report. No results found.   CT abdomen and pelvis with contrast 01/05/2016 IMPRESSION: 1. No dominant sigmoid colon mass identified. There is underdistention and soft tissue fullness at the rectosigmoid junction, suspicious for the primary lesion. Borderline enlarged nodes in the sigmoid mesocolon and at the origin of the inferior mesenteric artery are indeterminate. As these are in the drainage pathway for a sigmoid primary, localized nodal metastasis cannot be excluded. 2. No evidence of distant metastatic disease. 3.  Aortic atherosclerosis. 4. Uterine fibroid. 5. Left hepatic lobe giant hemangioma, as before.  CT chest 02/24/2016 IMPRESSION: 1. No evidence of pulmonary metastasis. 2. Stable large hepatic hemangioma.   Colonoscopy 01/04/2016 Dr. Watt Climes Findings: An ulcerated partially obstructing large mass was found in the rectosigmoid colon. The mass was partially circumferential, measured about 5 cm in length. Biopsy was taken. Multiple small milestone diverticula were found in the sigmoid colon. The exam otherwise  was normal.   ASSESSMENT & PLAN:  66 y.o. Caucasian female, with recently diagnosed colorectal cancer   1. Colorectal cancer, invasive adenocarcinoma,pT3N2bM0, stage IIIC, MSI-stable  -Bethany Frederick primary cancer is located in the rectosigmoid colon. -I previously reviewed Bethany Frederick lab findings with patient and Bethany Frederick husband. -She has had complete surgical resection -Previous CT scan was negative for distant metastasis. -Giving the multiple positive lymph nodes, and locally advanced disease, she is at high risk for recurrence. -she started adjuvant CAPOX, plan for 6 months -The goal of therapy is curative -She tolerated cycle 4 chemotherapy poorly, had a significant prolonged fatigue, anorexia, constipation and cold sensitivity and requested de-escalation of Bethany Frederick chemotherapy and oxaliplatin was held -She is tolerating single agent Xeloda very well  -Lab results reviewed with patient, CBC and CMP are within normal limits, adequate for treatment. she has started cycle 6 today -If the patient continues to feel well, I would recommend 2 more cycles of Xeloda to completed 6 months of adjuvant therapy   2. 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 This is considered a benign breast lesion, not high risk for breast cancer. -I previously used the Baker Janus model to calculate Bethany Frederick risk of developing breast cancer, Based on Bethany Frederick age, family history, pregnancy history, etc, Bethany Frederick risk of developing breast cancer is 3.3% in 5 years and 11.7% in Bethany Frederick lifetime. This is consider moderate risk. -We previously reviewed the risk factors for developing breast cancer, and strategies to prevent breast cancer, especially healthy diet, exercise, and maintain ideal weight.  -Chemoprevention with tamoxifen or anastrozole was discussed with Bethany Frederick before, and she declined. -We previously discussed the breast cancer screening,  including yearly screening mammogram with 3-D technology, self-exam every months and physician exam breast exam once a year.  --I discussed the risk of cancer recurrence in the future. I discussed the surveillance plan, which is a physical exam and lab test (including CBC, CMP and CEA) every 3 months for the first 2 years, then every 6-12 months, colonoscopy  in one year, and surveilliance CT scan every 6-12 month for up to 5 year.  -We will plan to remove Bethany Frederick port after Bethany Frederick first surveillance CT scan  3. Constipation -I advised the patient to take a stool softener regularly, and uses laxative as needed.  4. Nausea -I advised the patient to take Bethany Frederick nausea medication about 30 minutes prior to eating. -I encouraged Bethany Frederick to drink ginger tea or eat ginger candy. -Much better now since we held oxaliplatin   5. Cold sensitivity  -Secondary to oxaliplatin -She knows to avoid cold exposure -She has very mild peripheral neuropathy, we'll continue monitoring closely. -This is resolved now.  6. Neuropathy -The patient's neuropathy is slowly improving. -I advised Bethany Frederick to take B12 to help speed recovery. -The patient understands that recovery from neuropathy is slow.  7. HTN -Bethany Frederick blood pressure has been slightly elevated on Bethany Frederick recent visits, no prior history of hypertension -I suggest Bethany Frederick to check blood pressure at home or local pharmacy -We'll continue monitoring  Plan - she has started cycle 6 chemo with Xeloda today  -Refill 2 cycles of Xeloda today. -Follow up in 3 weeks with labs and flush   All questions were answered. The patient knows to call the clinic with any problems, questions or concerns.  I spent 20 minutes counseling the patient face to face. The total time spent in the appointment was 25 minutes and more than 50% was on counseling.  This document serves as a record of services personally performed by Truitt Merle, MD. It was created on Bethany Frederick behalf by Maryla Morrow, a trained  medical scribe. The creation of this record is based on the scribe's personal observations and the provider's statements to them. This document has been checked and approved by the attending provider.  I have reviewed the above documentation for accuracy and completeness, and I agree with the above information.      Truitt Merle  06/29/2016

## 2016-06-29 ENCOUNTER — Ambulatory Visit (HOSPITAL_BASED_OUTPATIENT_CLINIC_OR_DEPARTMENT_OTHER): Payer: Federal, State, Local not specified - PPO | Admitting: Hematology

## 2016-06-29 ENCOUNTER — Encounter: Payer: Self-pay | Admitting: Hematology

## 2016-06-29 ENCOUNTER — Ambulatory Visit: Payer: Federal, State, Local not specified - PPO

## 2016-06-29 ENCOUNTER — Telehealth: Payer: Self-pay | Admitting: Hematology

## 2016-06-29 ENCOUNTER — Other Ambulatory Visit (HOSPITAL_BASED_OUTPATIENT_CLINIC_OR_DEPARTMENT_OTHER): Payer: Federal, State, Local not specified - PPO

## 2016-06-29 ENCOUNTER — Telehealth: Payer: Self-pay | Admitting: *Deleted

## 2016-06-29 VITALS — BP 134/59 | HR 62 | Temp 98.2°F | Resp 18 | Ht 67.0 in | Wt 181.9 lb

## 2016-06-29 DIAGNOSIS — C779 Secondary and unspecified malignant neoplasm of lymph node, unspecified: Secondary | ICD-10-CM

## 2016-06-29 DIAGNOSIS — N6092 Unspecified benign mammary dysplasia of left breast: Secondary | ICD-10-CM

## 2016-06-29 DIAGNOSIS — K5900 Constipation, unspecified: Secondary | ICD-10-CM

## 2016-06-29 DIAGNOSIS — C20 Malignant neoplasm of rectum: Secondary | ICD-10-CM

## 2016-06-29 DIAGNOSIS — C19 Malignant neoplasm of rectosigmoid junction: Secondary | ICD-10-CM

## 2016-06-29 DIAGNOSIS — R110 Nausea: Secondary | ICD-10-CM

## 2016-06-29 DIAGNOSIS — C186 Malignant neoplasm of descending colon: Secondary | ICD-10-CM

## 2016-06-29 DIAGNOSIS — I10 Essential (primary) hypertension: Secondary | ICD-10-CM

## 2016-06-29 DIAGNOSIS — D591 Other autoimmune hemolytic anemias: Secondary | ICD-10-CM | POA: Diagnosis not present

## 2016-06-29 DIAGNOSIS — G629 Polyneuropathy, unspecified: Secondary | ICD-10-CM

## 2016-06-29 DIAGNOSIS — Z95828 Presence of other vascular implants and grafts: Secondary | ICD-10-CM

## 2016-06-29 LAB — CEA (IN HOUSE-CHCC): CEA (CHCC-In House): 2.69 ng/mL (ref 0.00–5.00)

## 2016-06-29 LAB — COMPREHENSIVE METABOLIC PANEL
ALT: 20 U/L (ref 0–55)
AST: 30 U/L (ref 5–34)
Albumin: 3.6 g/dL (ref 3.5–5.0)
Alkaline Phosphatase: 247 U/L — ABNORMAL HIGH (ref 40–150)
Anion Gap: 8 mEq/L (ref 3–11)
BUN: 11.1 mg/dL (ref 7.0–26.0)
CALCIUM: 9.4 mg/dL (ref 8.4–10.4)
CHLORIDE: 109 meq/L (ref 98–109)
CO2: 24 meq/L (ref 22–29)
CREATININE: 0.6 mg/dL (ref 0.6–1.1)
EGFR: >90 mL/min/{1.73_m2} (ref >90)
Glucose: 111 mg/dl (ref 70–140)
Potassium: 4 mEq/L (ref 3.5–5.1)
Sodium: 141 mEq/L (ref 136–145)
Total Bilirubin: 0.87 mg/dL (ref 0.20–1.20)
Total Protein: 6.1 g/dL — ABNORMAL LOW (ref 6.4–8.3)

## 2016-06-29 LAB — CBC WITH DIFFERENTIAL/PLATELET
BASO%: 1.4 % (ref 0.0–2.0)
Basophils Absolute: 0.1 10*3/uL (ref 0.0–0.1)
EOS%: 2.7 % (ref 0.0–7.0)
Eosinophils Absolute: 0.1 10*3/uL (ref 0.0–0.5)
HEMATOCRIT: 37.7 % (ref 34.8–46.6)
HGB: 12.5 g/dL (ref 11.6–15.9)
LYMPH#: 1.4 10*3/uL (ref 0.9–3.3)
LYMPH%: 30.3 % (ref 14.0–49.7)
MCH: 31.8 pg (ref 25.1–34.0)
MCHC: 33.1 g/dL (ref 31.5–36.0)
MCV: 96.1 fL (ref 79.5–101.0)
MONO#: 0.5 10*3/uL (ref 0.1–0.9)
MONO%: 10.8 % (ref 0.0–14.0)
NEUT%: 54.8 % (ref 38.4–76.8)
NEUTROS ABS: 2.4 10*3/uL (ref 1.5–6.5)
Platelets: 168 10*3/uL (ref 145–400)
RBC: 3.92 10*6/uL (ref 3.70–5.45)
RDW: 20.6 % — ABNORMAL HIGH (ref 11.2–14.5)
WBC: 4.5 10*3/uL (ref 3.9–10.3)

## 2016-06-29 MED ORDER — SODIUM CHLORIDE 0.9% FLUSH
10.0000 mL | INTRAVENOUS | Status: DC | PRN
  Administered 2016-06-29: 10 mL via INTRAVENOUS
  Filled 2016-06-29: qty 10

## 2016-06-29 MED ORDER — CAPECITABINE 500 MG PO TABS: 1000.0000 mg/m2 | ORAL_TABLET | Freq: Two times a day (BID) | ORAL | 1 refills | Status: DC

## 2016-06-29 MED ORDER — HEPARIN SOD (PORK) LOCK FLUSH 100 UNIT/ML IV SOLN
500.0000 [IU] | Freq: Once | INTRAVENOUS | Status: AC | PRN
  Administered 2016-06-29: 500 [IU] via INTRAVENOUS
  Filled 2016-06-29: qty 5

## 2016-06-29 NOTE — Telephone Encounter (Signed)
Appointments scheduled per 2/21 LOS. Patient given AVS report and calendars with future scheduled appointments. Patient only available on Monday and Tuesdays.

## 2016-06-29 NOTE — Telephone Encounter (Signed)
[email or web address] @ [phone].

## 2016-07-04 ENCOUNTER — Other Ambulatory Visit: Payer: Self-pay | Admitting: *Deleted

## 2016-07-04 DIAGNOSIS — C19 Malignant neoplasm of rectosigmoid junction: Secondary | ICD-10-CM

## 2016-07-04 MED ORDER — CAPECITABINE 500 MG PO TABS: 1000.0000 mg/m2 | ORAL_TABLET | Freq: Two times a day (BID) | ORAL | 1 refills | Status: DC

## 2016-07-12 NOTE — Progress Notes (Signed)
[name]  [phone] [phone]  Clinic Follow up Note   Patient Care Team: Bethany Rasmussen, MD as PCP - General (Family Medicine) Autumn Messing III, MD as Consulting Physician (General Surgery) 07/18/2016   CHIEF COMPLAINTS:  Recently diagnosed stage III colon cancer   Oncology History   Rectal cancer [facility])   Staging form: Colon and Rectum, AJCC 7th Edition   - Pathologic stage from 01/28/2016: Stage IIIC (T3, N2b, cM0) - Signed by Truitt Merle, MD on 02/12/2016      Cancer of left colon ([place])   01/04/2016 Procedure    Colonoscopy by Dr. Estanislado Emms showed a partially obstructing tumor in the rectal sigmoid colon, biopsied.      01/05/2016 Imaging    CT abdomen and pelvis with contrast showed no dominant sigmoid colon mass, soft tissue fullness at the rectosigmoid junction, suspicious for the primary lesion. Borderline enlarged nodes in the sigmoid mesocolon and at the origin of the inferior mesenteric artery are indeterminate no evidence of December metastasis. Left hepatic lobe giant hemangioma.       01/28/2016 Initial Diagnosis    Rectal cancer ([place])     01/28/2016 Surgery    Endoscopic low anterior resection of rectosigmoid segmental colon for tumor       01/28/2016 Pathology Results    Invasive colonic adenocarcinoma, 4.5 cm extending into her chronic connective tissue, lymphovascular involvement by tumor, margins were negative, metastatic carcinoma in 9 of 21 lymph nodes.      02/24/2016 Imaging    CT chest w/o contrast IMPRESSION: 1. No evidence of pulmonary metastasis. 2. Stable large hepatic hemangioma.       HISTORY OF PRESENTING ILLNESS (05/14/2015):  Bethany Frederick 66 y.o. female is here to discussed her risk of breast cancer.   She has had palpable left breast mass for over 20 years, unchanged. Her mammogram in 2014 showed a 0.8 cm cyst. She does screening mammogram once a year. The lesion looks slightly more suspicious on the  mammogram in August 2016, and a biopsy was recommended, which showed sclerosing lesion with typical ductal hyperplasia. She was referred to see Dr. Marlou Starks and underwent left breast lumpectomy. She tolerated the surgery very well, and has recovered completely.  She feels well, has mild fatigue, she takes care two of her brothers, who live alone and have health issues. She lives with her hsuband and 86 yo son. She remains physically active, does not exercise regularly. She has mild arthritis, takes ibuprofen and Aleve as needed, no other complaints.  GYN HISTORY  Menarchal: 10 LMP: 26 Contraceptive: 24 years  HRT: 5 years, stopped at age of 45  G1P1: one son, she was 53 yo when she had her son   CURRENT THERAPY: Adjuvant chemotherapy with CAPOX (Xeloda 2000 mg twice daily, on day 1-14, oxaliplatin 184m/m2 on day 1) every 3 weeks, started 03/08/2016, Oxaliplatin held after 4 cycles due to poor tolerance   INTERIM HISTORY  [name] for follow-up. She has been doing well. She has some neuropathy in her fingers and feet and some fatigue from the chemo, but otherwise tolerates it well. She can hold objects very well, denies dropping things. The neuropathy is tolerable. She has some very dry skin on her hands. For the past few weeks she has noticed an abdominal hernia, but denies abdominal pain. She has had a previous hernia repair and an apendectomy. Denies any other concerns.   MEDICAL HISTORY:  Past Medical History:  Diagnosis  Date  . Arthritis    back, hip, feet  . Cancer ([place])    Sigmoid colon  . Cataract, immature   . GERD (gastroesophageal reflux disease)   . Heart palpitations   . 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 Seasonal allergies   . Wears partial dentures    upper front - 1 tooth    SURGICAL HISTORY: Past Surgical History:  Procedure Laterality Date  . APPENDECTOMY    . BREAST LUMPECTOMY WITH RADIOACTIVE SEED LOCALIZATION Left 03/13/2015   Procedure: BREAST  LUMPECTOMY WITH RADIOACTIVE SEED LOCALIZATION;  Surgeon: Autumn Messing III, MD;  Location: [place];  Service: General;  Laterality: Left;  . COLONOSCOPY    . ENDOMETRIAL FULGURATION  06/13/2000  . INGUINAL HERNIA REPAIR  03/05/2012   Procedure: HERNIA REPAIR INGUINAL INCARCERATED;  Surgeon: Ralene Ok, MD;  Location: [place];  Service: General;  Laterality: N/A;  . INSERTION OF MESH  03/05/2012   Procedure: INSERTION OF MESH;  Surgeon: Ralene Ok, MD;  Location: [place];  Service: General;  Laterality: Left;  . LAPAROSCOPIC SIGMOID COLECTOMY Bilateral 01/28/2016   Procedure: LAPAROSCOPIC ASSISTED SIGMOID COLECTOMY;  Surgeon: Autumn Messing III, MD;  Location: [place];  Service: General;  Laterality: Bilateral;  . MYOMECTOMY  06/13/2000  . PORTACATH PLACEMENT N/A 02/18/2016   Procedure: INSERTION PORT-A-CATH;  Surgeon: Autumn Messing III, MD;  Location: [place];  Service: General;  Laterality: N/A;  . ROTATOR CUFF REPAIR Left    2013    SOCIAL HISTORY: Social History   Social History  . Marital status: Married    Spouse name: N/A  . Number of children: N/A  . Years of education: N/A   Occupational History  . Not on file.   Social History Main Topics  . Smoking status: Never Smoker  . Smokeless tobacco: Never Used  . Alcohol use Yes     Comment: occasionally  . Drug use: No  . Sexual activity: Not Currently   Other Topics Concern  . Not on file   Social History Narrative  . No narrative on file    FAMILY HISTORY: Family History  Problem Relation Age of Onset  . Cancer Brother 54    prostate cancer   . Cancer Paternal Aunt 59    breast cancer   . Cancer Cousin 40    ovarian and breast cancer (maternal cousin)     ALLERGIES:  is allergic to no known allergies.  MEDICATIONS:  Current Outpatient Prescriptions  Medication Sig Dispense Refill  . capecitabine (XELODA) 500 MG tablet Take 4 tablets (2,000 mg total) by mouth 2 (two) times daily  after a meal. Take on days 1-14 of chemotherapy. 112 tablet 1  . clobetasol cream (TEMOVATE) 6.20 % Apply 1 application topically daily as needed (RASH).     . famotidine (PEPCID) 20 MG tablet Take 20 mg by mouth 2 (two) times daily as needed for heartburn or indigestion.   0  . lidocaine-prilocaine (EMLA) cream Apply 1 application topically as needed. Apply to port site 1 hour prior to stick and cover w/plastic wrap 30 g 11  . ondansetron (ZOFRAN) 8 MG tablet Take 1 tablet (8 mg total) by mouth 2 (two) times daily as needed for refractory nausea / vomiting. Start on day 3 after chemotherapy. 30 tablet 1  . prochlorperazine (COMPAZINE) 10 MG tablet Take 1 tablet (10 mg total) by mouth every 6 (six) hours as needed (Nausea or vomiting). 30 tablet 1  No current facility-administered medications for this visit.    Facility-Administered Medications Ordered in Other Visits  Medication Dose Route Frequency Provider Last Rate Last Dose  . sodium chloride flush (NS) 0.9 % injection 10 mL  10 mL Intravenous PRN Truitt Merle, MD   10 mL at 05/31/16 1303    REVIEW OF SYSTEMS: Constitutional: Denies fevers, chills or abnormal night sweats (+) fatigue Eyes: Denies blurriness of vision, double vision or watery eyes Ears, nose, mouth, throat, and face: Denies mucositis or sore throat Respiratory: Denies cough, dyspnea or wheezes Cardiovascular: Denies palpitation, chest discomfort or lower extremity swelling Gastrointestinal: Negtive (+) abdominal hernia Skin: Denies abnormal skin rashes. (+) dry skin Lymphatics: Denies new lymphadenopathy or easy bruising Neurological: (+) neuropathy in fingertips and toes Behavioral/Psych: Mood is stable, no new changes  All other systems were reviewed with the patient and are negative.  PHYSICAL EXAMINATION: ECOG PERFORMANCE STATUS: 1  BP 133/62 (BP Location: Left Arm, Patient Position: Sitting)   Pulse 67   Temp 98.1 F (36.7 C) (Oral)   Resp 18   Ht 5' 7"  (1.702 m)   Wt 182 lb 4.8 oz (82.7 kg)   SpO2 98%   BMI 28.55 kg/m    GENERAL:alert, no distress and comfortable  SKIN: skin color, texture, turgor are normal, no rashes or significant lesions EYES: normal, conjunctiva are pink and non-injected, sclera clear OROPHARYNX:no exudate, no erythema and lips, buccal mucosa, and tongue normal  NECK: supple, thyroid normal size, non-tender, without nodularity LYMPH:  no palpable lymphadenopathy in the cervical, axillary or inguinal LUNGS: clear to auscultation and percussion with normal breathing effort HEART: regular rate & rhythm and no murmurs and no lower extremity edema ABDOMEN:abdomen soft, non-tender and normal bowel sounds, midline incision has healed well, no discharge or skin erythema.  Musculoskeletal:no cyanosis of digits and no clubbing  PSYCH: alert & oriented x 3 with fluent speech NEURO: no focal motor/sensory deficits  LABORATORY DATA:  I have reviewed the data as listed CBC Latest Ref Rng & Units 07/18/2016 06/29/2016 05/31/2016  WBC 3.9 - 10.3 10e3/uL 6.2 4.5 3.9  Hemoglobin 11.6 - 15.9 g/dL 12.3 12.5 11.7  Hematocrit 34.8 - 46.6 % 36.5 37.7 35.2  Platelets 145 - 400 10e3/uL 157 168 131(L)   CMP Latest Ref Rng & Units 07/18/2016 06/29/2016 05/31/2016  Glucose 70 - 140 mg/dl 101 111 100  BUN 7.0 - 26.0 mg/dL 16.6 11.1 12.9  Creatinine 0.6 - 1.1 mg/dL 0.7 0.6 0.6  Sodium 136 - 145 mEq/L 142 141 143  Potassium 3.5 - 5.1 mEq/L 3.9 4.0 3.4(L)  Chloride 101 - 111 mmol/L - - -  CO2 22 - 29 mEq/L _0 Calcium 8.4 - 10.4 mg/dL 9.7 9.4 9.0  Total Protein 6.4 - 8.3 g/dL 6.1(L) 6.1(L) 5.6(L)  Total Bilirubin 0.20 - 1.20 mg/dL 0.57 0.87 1.06  Alkaline Phos 40 - 150 U/L 197(H) 247(H) 157(H)  AST 5 - 34 U/L _1 ALT 0 - 55 U/L _2 Results for Bethany Frederick, Bethany Frederick (MRN [id number]) as of 07/12/2016 08:12  Ref. Range 02/12/2016 13:30 03/08/2016 09:51 03/29/2016 12:52 05/10/2016 12:20 06/29/2016 10:28  CEA ([facility]) Latest Ref  Range: 0.00 - 5.00 ng/mL 1.56 1.55 2.02 4.01 2.69   PATHOLOGY REPORT  Diagnosis 01/04/2016 Colon, biopsy, rectosigmoid junction - ADENOCARCINOMA, SEE COMMENT.  Diagnosis 01/28/2016 1. Soft tissue, biopsy, Implant on sigmoid colon - BENIGN CALCIFIED NODULE. - NO EVIDENCE OF MALIGNANCY. 2. Colon,  segmental resection for tumor, Recto-sigmoid - INVASIVE COLONIC ADENOCARCINOMA, 4.5 CM EXTENDING INTO PERICOLONIC CONNECTIVE TISSUE. - LYMPHATIC VASCULAR INVOLVEMENT BY TUMOR. - MARGINS NOT INVOLVED. - METASTATIC CARCINOMA IN 9 OF 21 LYMPH NODES (9/21). - SEE ONCOLOGY TABLE BELOW. 3. Colon, resection margin (donut), Proximal and distal of sigmoid - BENIGN PROXIMAL AND DISTAL ANASTOMATIC RINGS. - NO EVIDENCE OF MALIGNANCY. Specimen Gross and Clinical Information  RADIOGRAPHIC STUDIES: I have personally reviewed the radiological images as listed and agreed with the findings in the report. No results found.   CT abdomen and pelvis with contrast 01/05/2016 IMPRESSION: 1. No dominant sigmoid colon mass identified. There is underdistention and soft tissue fullness at the rectosigmoid junction, suspicious for the primary lesion. Borderline enlarged nodes in the sigmoid mesocolon and at the origin of the inferior mesenteric artery are indeterminate. As these are in the drainage pathway for a sigmoid primary, localized nodal metastasis cannot be excluded. 2. No evidence of distant metastatic disease. 3.  Aortic atherosclerosis. 4. Uterine fibroid. 5. Left hepatic lobe giant hemangioma, as before.  CT chest 02/24/2016 IMPRESSION: 1. No evidence of pulmonary metastasis. 2. Stable large hepatic hemangioma.  Colonoscopy 01/04/2016 Dr. Watt Climes Findings: An ulcerated partially obstructing large mass was found in the rectosigmoid colon. The mass was partially circumferential, measured about 5 cm in length. Biopsy was taken. Multiple small milestone diverticula were found in the sigmoid colon. The  exam otherwise was normal.   ASSESSMENT & PLAN:  66 y.o. Caucasian female, with recently diagnosed colorectal cancer   1. Colorectal cancer, invasive adenocarcinoma,pT3N2bM0, stage IIIC, MSI-stable  -Her primary cancer is located in the rectosigmoid colon. -I previously reviewed her staging scan findings with patient and her husband. -She has had complete surgical resection -Giving the multiple positive lymph nodes, and locally advanced disease, she is at high risk for recurrence. -she started adjuvant CAPOX, plan for 6 months -The goal of therapy is curative -She tolerated the cycle 4 chemotherapy poorly, had a significant prolonged fatigue, anorexia, constipation and cold sensitivity and requested de-escalation of her chemotherapy and oxaliplatin was held -She is tolerating single agent Xeloda very well  -Lab results reviewed with patient, CBC and CMP are within normal limits, adequate for treatment.  -If the patient continues to feel well, I would recommend completing 6 months of adjuvant therapy with Xeloda.  --I again discussed the risk of cancer recurrence in the future. I discussed the surveillance plan, which is a physical exam and lab test (including CBC, CMP and CEA) every 3 months for the first 2 years, then every 6-12 months, colonoscopy in one year, and surveilliance CT scan every 6-12 month for up to 5 year.  -2 cycles of oral chemotherapy left.  -Her skin has been dry. I encouraged her to moisturize well.  -We will plan to remove her port after her first surveillance CT scan -Next CT scan after treatment. 2 cycles remaining. Order this at next visit.   2. 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 This is considered a benign breast lesion, not high risk for breast cancer. -I previously used the Baker Janus model to calculate her risk of developing breast cancer, Based on  her age, family history, pregnancy history, etc, her risk of developing breast cancer is 3.3% in 5 years and 11.7% in her lifetime. This is consider moderate risk. -We previously reviewed the risk factors for developing breast cancer, and strategies to prevent  breast cancer, especially healthy diet, exercise, and maintain ideal weight.  -Chemoprevention with tamoxifen or anastrozole was discussed with her before, and she declined. -We previously discussed the breast cancer screening, including yearly screening mammogram with 3-D technology, self-exam every months and physician exam breast exam once a year.   3. Constipation -I advised the patient to take a stool softener regularly, and uses laxative as needed.  4. 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  5. HTN -Her blood pressure has been slightly elevated on her recent visits, no prior history of hypertension -I suggest her to check blood pressure at home or local pharmacy -We'll continue monitoring  Plan - she will start cycle 7 Xeloda tomorrow.  -Follow up in 3 weeks with labs before cycle 8 Xeloda (last cycle). Order surveillance CT scan at next visit.   All questions were answered. The patient knows to call the clinic with any problems, questions or concerns.  I spent 20 minutes counseling the patient face to face. The total time spent in the appointment was 25 minutes and more than 50% was on counseling.  This document serves as a record of services personally performed by Truitt Merle, MD. It was created on her behalf by Martinique Casey, a trained medical scribe. The creation of this record is based on the scribe's personal observations and the provider's statements to them. This document has been checked and approved by the attending provider.  I have reviewed the above documentation  for accuracy and completeness, and I agree with the above information.      Truitt Merle  07/18/2016

## 2016-07-18 ENCOUNTER — Ambulatory Visit: Payer: Federal, State, Local not specified - PPO

## 2016-07-18 ENCOUNTER — Other Ambulatory Visit (HOSPITAL_BASED_OUTPATIENT_CLINIC_OR_DEPARTMENT_OTHER): Payer: Federal, State, Local not specified - PPO

## 2016-07-18 ENCOUNTER — Encounter: Payer: Self-pay | Admitting: Hematology

## 2016-07-18 ENCOUNTER — Ambulatory Visit (HOSPITAL_BASED_OUTPATIENT_CLINIC_OR_DEPARTMENT_OTHER): Payer: Federal, State, Local not specified - PPO | Admitting: Hematology

## 2016-07-18 ENCOUNTER — Telehealth: Payer: Self-pay | Admitting: Hematology

## 2016-07-18 VITALS — BP 133/62 | HR 67 | Temp 98.1°F | Resp 18 | Ht 67.0 in | Wt 182.3 lb

## 2016-07-18 DIAGNOSIS — G620 Drug-induced polyneuropathy: Secondary | ICD-10-CM | POA: Diagnosis not present

## 2016-07-18 DIAGNOSIS — N6092 Unspecified benign mammary dysplasia of left breast: Secondary | ICD-10-CM

## 2016-07-18 DIAGNOSIS — I10 Essential (primary) hypertension: Secondary | ICD-10-CM

## 2016-07-18 DIAGNOSIS — C186 Malignant neoplasm of descending colon: Secondary | ICD-10-CM

## 2016-07-18 DIAGNOSIS — C20 Malignant neoplasm of rectum: Secondary | ICD-10-CM

## 2016-07-18 DIAGNOSIS — C19 Malignant neoplasm of rectosigmoid junction: Secondary | ICD-10-CM | POA: Diagnosis not present

## 2016-07-18 DIAGNOSIS — C779 Secondary and unspecified malignant neoplasm of lymph node, unspecified: Secondary | ICD-10-CM | POA: Diagnosis not present

## 2016-07-18 DIAGNOSIS — K5900 Constipation, unspecified: Secondary | ICD-10-CM | POA: Diagnosis not present

## 2016-07-18 DIAGNOSIS — Z95828 Presence of other vascular implants and grafts: Secondary | ICD-10-CM

## 2016-07-18 LAB — COMPREHENSIVE METABOLIC PANEL
ALT: 14 U/L (ref 0–55)
ANION GAP: 9 meq/L (ref 3–11)
AST: 22 U/L (ref 5–34)
Albumin: 3.8 g/dL (ref 3.5–5.0)
Alkaline Phosphatase: 197 U/L — ABNORMAL HIGH (ref 40–150)
BILIRUBIN TOTAL: 0.57 mg/dL (ref 0.20–1.20)
BUN: 16.6 mg/dL (ref 7.0–26.0)
CALCIUM: 9.7 mg/dL (ref 8.4–10.4)
CO2: 24 meq/L (ref 22–29)
CREATININE: 0.7 mg/dL (ref 0.6–1.1)
Chloride: 109 mEq/L (ref 98–109)
EGFR: >90 mL/min/{1.73_m2} (ref >90)
Glucose: 101 mg/dl (ref 70–140)
Potassium: 3.9 mEq/L (ref 3.5–5.1)
Sodium: 142 mEq/L (ref 136–145)
TOTAL PROTEIN: 6.1 g/dL — AB (ref 6.4–8.3)

## 2016-07-18 LAB — CBC WITH DIFFERENTIAL/PLATELET
BASO%: 0.5 % (ref 0.0–2.0)
Basophils Absolute: 0 10*3/uL (ref 0.0–0.1)
EOS%: 2.4 % (ref 0.0–7.0)
Eosinophils Absolute: 0.2 10*3/uL (ref 0.0–0.5)
HEMATOCRIT: 36.5 % (ref 34.8–46.6)
HEMOGLOBIN: 12.3 g/dL (ref 11.6–15.9)
LYMPH#: 1.8 10*3/uL (ref 0.9–3.3)
LYMPH%: 28.8 % (ref 14.0–49.7)
MCH: 32.1 pg (ref 25.1–34.0)
MCHC: 33.7 g/dL (ref 31.5–36.0)
MCV: 95.3 fL (ref 79.5–101.0)
MONO#: 0.6 10*3/uL (ref 0.1–0.9)
MONO%: 9.2 % (ref 0.0–14.0)
NEUT%: 59.1 % (ref 38.4–76.8)
NEUTROS ABS: 3.6 10*3/uL (ref 1.5–6.5)
PLATELETS: 157 10*3/uL (ref 145–400)
RBC: 3.83 10*6/uL (ref 3.70–5.45)
RDW: 18.5 % — ABNORMAL HIGH (ref 11.2–14.5)
WBC: 6.2 10*3/uL (ref 3.9–10.3)

## 2016-07-18 LAB — CEA (IN HOUSE-CHCC): CEA (CHCC-In House): 2.52 ng/mL (ref 0.00–5.00)

## 2016-07-18 MED ORDER — SODIUM CHLORIDE 0.9% FLUSH
10.0000 mL | INTRAVENOUS | Status: DC | PRN
  Administered 2016-07-18: 10 mL via INTRAVENOUS
  Filled 2016-07-18: qty 10

## 2016-07-18 NOTE — Patient Instructions (Signed)
Implanted Port Home Guide An implanted port is a type of central line that is placed under the skin. Central lines are used to provide IV access when treatment or nutrition needs to be given through a person's veins. Implanted ports are used for long-term IV access. An implanted port may be placed because:  You need IV medicine that would be irritating to the small veins in your hands or arms.  You need long-term IV medicines, such as antibiotics.  You need IV nutrition for a long period.  You need frequent blood draws for lab tests.  You need dialysis.  Implanted ports are usually placed in the chest area, but they can also be placed in the upper arm, the abdomen, or the leg. An implanted port has two main parts:  Reservoir. The reservoir is round and will appear as a small, raised area under your skin. The reservoir is the part where a needle is inserted to give medicines or draw blood.  Catheter. The catheter is a thin, flexible tube that extends from the reservoir. The catheter is placed into a large vein. Medicine that is inserted into the reservoir goes into the catheter and then into the vein.  How will I care for my incision site? Do not get the incision site wet. Bathe or shower as directed by your health care provider. How is my port accessed? Special steps must be taken to access the port:  Before the port is accessed, a numbing cream can be placed on the skin. This helps numb the skin over the port site.  Your health care provider uses a sterile technique to access the port. ? Your health care provider must put on a mask and sterile gloves. ? The skin over your port is cleaned carefully with an antiseptic and allowed to dry. ? The port is gently pinched between sterile gloves, and a needle is inserted into the port.  Only "non-coring" port needles should be used to access the port. Once the port is accessed, a blood return should be checked. This helps ensure that the port  is in the vein and is not clogged.  If your port needs to remain accessed for a constant infusion, a clear (transparent) bandage will be placed over the needle site. The bandage and needle will need to be changed every week, or as directed by your health care provider.  Keep the bandage covering the needle clean and dry. Do not get it wet. Follow your health care provider's instructions on how to take a shower or bath while the port is accessed.  If your port does not need to stay accessed, no bandage is needed over the port.  What is flushing? Flushing helps keep the port from getting clogged. Follow your health care provider's instructions on how and when to flush the port. Ports are usually flushed with saline solution or a medicine called heparin. The need for flushing will depend on how the port is used.  If the port is used for intermittent medicines or blood draws, the port will need to be flushed: ? After medicines have been given. ? After blood has been drawn. ? As part of routine maintenance.  If a constant infusion is running, the port may not need to be flushed.  How long will my port stay implanted? The port can stay in for as long as your health care provider thinks it is needed. When it is time for the port to come out, surgery will be   done to remove it. The procedure is similar to the one performed when the port was put in. When should I seek immediate medical care? When you have an implanted port, you should seek immediate medical care if:  You notice a bad smell coming from the incision site.  You have swelling, redness, or drainage at the incision site.  You have more swelling or pain at the port site or the surrounding area.  You have a fever that is not controlled with medicine.  This information is not intended to replace advice given to you by your health care provider. Make sure you discuss any questions you have with your health care provider. Document  Released: 04/25/2005 Document Revised: 10/01/2015 Document Reviewed: 12/31/2012 Elsevier Interactive Patient Education  2017 [employer]  

## 2016-07-18 NOTE — Telephone Encounter (Signed)
Appointments scheduled per 3/12 LOS. Patient states that she is to return in 3 weeks instead of 3 months. Patient given AVS report and calendars with future scheduled appointments.

## 2016-08-05 ENCOUNTER — Telehealth: Payer: Self-pay | Admitting: Hematology

## 2016-08-05 NOTE — Telephone Encounter (Signed)
YF out - moved 4/3 appointments for 4/4. Per patient she needs to have lab and see YF before starting her meds on 4/4. Patient has new date/time for 4/4 @ 8:30 am.

## 2016-08-09 ENCOUNTER — Ambulatory Visit: Payer: Federal, State, Local not specified - PPO | Admitting: Hematology

## 2016-08-09 ENCOUNTER — Other Ambulatory Visit: Payer: Federal, State, Local not specified - PPO

## 2016-08-10 ENCOUNTER — Ambulatory Visit: Payer: Federal, State, Local not specified - PPO

## 2016-08-10 ENCOUNTER — Other Ambulatory Visit (HOSPITAL_BASED_OUTPATIENT_CLINIC_OR_DEPARTMENT_OTHER): Payer: Federal, State, Local not specified - PPO

## 2016-08-10 ENCOUNTER — Encounter: Payer: Self-pay | Admitting: Hematology

## 2016-08-10 ENCOUNTER — Telehealth: Payer: Self-pay | Admitting: Hematology

## 2016-08-10 ENCOUNTER — Ambulatory Visit (HOSPITAL_BASED_OUTPATIENT_CLINIC_OR_DEPARTMENT_OTHER): Payer: Federal, State, Local not specified - PPO | Admitting: Hematology

## 2016-08-10 DIAGNOSIS — N6092 Unspecified benign mammary dysplasia of left breast: Secondary | ICD-10-CM | POA: Diagnosis not present

## 2016-08-10 DIAGNOSIS — K5900 Constipation, unspecified: Secondary | ICD-10-CM | POA: Diagnosis not present

## 2016-08-10 DIAGNOSIS — C186 Malignant neoplasm of descending colon: Secondary | ICD-10-CM

## 2016-08-10 DIAGNOSIS — C779 Secondary and unspecified malignant neoplasm of lymph node, unspecified: Secondary | ICD-10-CM | POA: Diagnosis not present

## 2016-08-10 DIAGNOSIS — C19 Malignant neoplasm of rectosigmoid junction: Secondary | ICD-10-CM

## 2016-08-10 DIAGNOSIS — I10 Essential (primary) hypertension: Secondary | ICD-10-CM | POA: Diagnosis not present

## 2016-08-10 DIAGNOSIS — G620 Drug-induced polyneuropathy: Secondary | ICD-10-CM | POA: Diagnosis not present

## 2016-08-10 DIAGNOSIS — C20 Malignant neoplasm of rectum: Secondary | ICD-10-CM

## 2016-08-10 DIAGNOSIS — Z95828 Presence of other vascular implants and grafts: Secondary | ICD-10-CM

## 2016-08-10 LAB — COMPREHENSIVE METABOLIC PANEL
ALT: 19 U/L (ref 0–55)
ANION GAP: 7 meq/L (ref 3–11)
AST: 24 U/L (ref 5–34)
Albumin: 3.8 g/dL (ref 3.5–5.0)
Alkaline Phosphatase: 186 U/L — ABNORMAL HIGH (ref 40–150)
BUN: 15.9 mg/dL (ref 7.0–26.0)
CHLORIDE: 110 meq/L — AB (ref 98–109)
CO2: 22 meq/L (ref 22–29)
Calcium: 9.6 mg/dL (ref 8.4–10.4)
Creatinine: 0.7 mg/dL (ref 0.6–1.1)
Glucose: 113 mg/dl (ref 70–140)
POTASSIUM: 4.2 meq/L (ref 3.5–5.1)
Sodium: 139 mEq/L (ref 136–145)
Total Bilirubin: 0.72 mg/dL (ref 0.20–1.20)
Total Protein: 6.2 g/dL — ABNORMAL LOW (ref 6.4–8.3)

## 2016-08-10 LAB — CBC WITH DIFFERENTIAL/PLATELET
BASO%: 2 % (ref 0.0–2.0)
Basophils Absolute: 0.1 10*3/uL (ref 0.0–0.1)
EOS%: 3 % (ref 0.0–7.0)
Eosinophils Absolute: 0.1 10*3/uL (ref 0.0–0.5)
HCT: 38.4 % (ref 34.8–46.6)
HGB: 13 g/dL (ref 11.6–15.9)
LYMPH#: 1.2 10*3/uL (ref 0.9–3.3)
LYMPH%: 28.6 % (ref 14.0–49.7)
MCH: 32.4 pg (ref 25.1–34.0)
MCHC: 33.9 g/dL (ref 31.5–36.0)
MCV: 95.6 fL (ref 79.5–101.0)
MONO#: 0.4 10*3/uL (ref 0.1–0.9)
MONO%: 9.9 % (ref 0.0–14.0)
NEUT#: 2.4 10*3/uL (ref 1.5–6.5)
NEUT%: 56.5 % (ref 38.4–76.8)
PLATELETS: 148 10*3/uL (ref 145–400)
RBC: 4.01 10*6/uL (ref 3.70–5.45)
RDW: 21.1 % — AB (ref 11.2–14.5)
WBC: 4.3 10*3/uL (ref 3.9–10.3)

## 2016-08-10 MED ORDER — HEPARIN SOD (PORK) LOCK FLUSH 100 UNIT/ML IV SOLN
500.0000 [IU] | Freq: Once | INTRAVENOUS | Status: AC | PRN
  Administered 2016-08-10: 500 [IU] via INTRAVENOUS
  Filled 2016-08-10: qty 5

## 2016-08-10 MED ORDER — SODIUM CHLORIDE 0.9% FLUSH
10.0000 mL | INTRAVENOUS | Status: DC | PRN
  Administered 2016-08-10: 10 mL via INTRAVENOUS
  Filled 2016-08-10: qty 10

## 2016-08-10 NOTE — Progress Notes (Signed)
[place]  [phone] [phone]  Clinic Follow up Note   Patient Care Team: Bethany Rasmussen, MD as PCP - General (Family Medicine) Bethany Messing III, MD as Consulting Physician (General Surgery) 08/10/2016   CHIEF COMPLAINTS:  Recently diagnosed stage Frederick colon cancer   Oncology History   Rectal cancer [facility])   Staging form: Colon and Rectum, AJCC 7th Edition   - Pathologic stage from 01/28/2016: Stage IIIC (T3, N2b, cM0) - Signed by Bethany Merle, MD on 02/12/2016      Cancer of left colon ([place])   01/04/2016 Procedure    Colonoscopy by Dr. Estanislado Frederick showed a partially obstructing tumor in the rectal sigmoid colon, biopsied.      01/05/2016 Imaging    CT abdomen and pelvis with contrast showed no dominant sigmoid colon mass, soft tissue fullness at the rectosigmoid junction, suspicious for the primary lesion. Borderline enlarged nodes in the sigmoid mesocolon and at the origin of the inferior mesenteric artery are indeterminate no evidence of December metastasis. Left hepatic lobe giant hemangioma.       01/28/2016 Initial Diagnosis    Rectal cancer ([place])     01/28/2016 Surgery    Endoscopic low anterior resection of rectosigmoid segmental colon for tumor       01/28/2016 Pathology Results    Invasive colonic adenocarcinoma, 4.5 cm extending into her chronic connective tissue, lymphovascular involvement by tumor, margins were negative, metastatic carcinoma in 9 of 21 lymph nodes.      02/24/2016 Imaging    CT chest w/o contrast IMPRESSION: 1. No evidence of pulmonary metastasis. 2. Stable large hepatic hemangioma.       HISTORY OF PRESENTING ILLNESS (05/14/2015):  Bethany Frederick 66 y.o. female is here to discussed her risk of breast cancer.   She has had palpable left breast mass for over 20 years, unchanged. Her mammogram in 2014 showed a 0.8 cm cyst. She does screening mammogram once a year. The lesion looks slightly more suspicious on the  mammogram in August 2016, and a biopsy was recommended, which showed sclerosing lesion with typical ductal hyperplasia. She was referred to see Dr. Marlou Frederick and underwent left breast lumpectomy. She tolerated the surgery very well, and has recovered completely.  She feels well, has mild fatigue, she takes care two of her brothers, who live alone and have health issues. She lives with her hsuband and 18 yo son. She remains physically active, does not exercise regularly. She has mild arthritis, takes ibuprofen and Aleve as needed, no other complaints.  GYN HISTORY  Menarchal: 10 LMP: 69 Contraceptive: 23 years  HRT: 5 years, stopped at age of 35  G1P1: one son, she was 9 yo when she had her son   CURRENT THERAPY: Adjuvant chemotherapy with CAPOX (Xeloda 2000 mg twice daily, on day 1-14, oxaliplatin 131m/m2 on day 1) every 3 weeks, started 03/08/2016, Oxaliplatin held after 4 cycles due to poor tolerance   INTERIM HISTORY  [name] for follow-up. She has been doing well. She has some neuropathy in her fingers and feet and some fatigue from the chemo, but otherwise tolerates it well. She reports it is worse in her feet than in her hands. Describes it as numbness. The neuropathy is tolerable. Denies issues with appetite. Reports an episode of diarrhea towards the end of the Xeloda cycle. Feels better when she is off the cycle. She is scheduled to start her 8th cycle of Xeloda today.  MEDICAL HISTORY:  Past Medical  History:  Diagnosis Date  . Arthritis    back, hip, feet  . Cancer ([place])    Sigmoid colon  . Cataract, immature   . GERD (gastroesophageal reflux disease)   . Heart palpitations   . 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 Seasonal allergies   . Wears partial dentures    upper front - 1 tooth    SURGICAL HISTORY: Past Surgical History:  Procedure Laterality Date  . APPENDECTOMY    . BREAST LUMPECTOMY WITH RADIOACTIVE SEED LOCALIZATION Left 03/13/2015   Procedure: BREAST  LUMPECTOMY WITH RADIOACTIVE SEED LOCALIZATION;  Surgeon: Bethany Messing III, MD;  Location: [facility];  Service: General;  Laterality: Left;  . COLONOSCOPY    . ENDOMETRIAL FULGURATION  06/13/2000  . INGUINAL HERNIA REPAIR  03/05/2012   Procedure: HERNIA REPAIR INGUINAL INCARCERATED;  Surgeon: Ralene Ok, MD;  Location: [place];  Service: General;  Laterality: N/A;  . INSERTION OF MESH  03/05/2012   Procedure: INSERTION OF MESH;  Surgeon: Ralene Ok, MD;  Location: [place];  Service: General;  Laterality: Left;  . LAPAROSCOPIC SIGMOID COLECTOMY Bilateral 01/28/2016   Procedure: LAPAROSCOPIC ASSISTED SIGMOID COLECTOMY;  Surgeon: Bethany Messing III, MD;  Location: [place];  Service: General;  Laterality: Bilateral;  . MYOMECTOMY  06/13/2000  . PORTACATH PLACEMENT N/A 02/18/2016   Procedure: INSERTION PORT-A-CATH;  Surgeon: Bethany Messing III, MD;  Location: [place];  Service: General;  Laterality: N/A;  . ROTATOR CUFF REPAIR Left    2013    SOCIAL HISTORY: Social History   Social History  . Marital status: Married    Spouse name: N/A  . Number of children: N/A  . Years of education: N/A   Occupational History  . Not on file.   Social History Main Topics  . Smoking status: Never Smoker  . Smokeless tobacco: Never Used  . Alcohol use Yes     Comment: occasionally  . Drug use: No  . Sexual activity: Not Currently   Other Topics Concern  . Not on file   Social History Narrative  . No narrative on file    FAMILY HISTORY: Family History  Problem Relation Age of Onset  . Cancer Brother 53    prostate cancer   . Cancer Paternal Aunt 19    breast cancer   . Cancer Cousin 40    ovarian and breast cancer (maternal cousin)     ALLERGIES:  is allergic to no known allergies.  MEDICATIONS:  Current Outpatient Prescriptions  Medication Sig Dispense Refill  . clobetasol cream (TEMOVATE) 0.24 % Apply 1 application topically daily as needed (RASH).     .  famotidine (PEPCID) 20 MG tablet Take 20 mg by mouth 2 (two) times daily as needed for heartburn or indigestion.   0  . lidocaine-prilocaine (EMLA) cream Apply 1 application topically as needed. Apply to port site 1 hour prior to stick and cover w/plastic wrap 30 g 11  . ondansetron (ZOFRAN) 8 MG tablet Take 1 tablet (8 mg total) by mouth 2 (two) times daily as needed for refractory nausea / vomiting. Start on day 3 after chemotherapy. 30 tablet 1  . prochlorperazine (COMPAZINE) 10 MG tablet Take 1 tablet (10 mg total) by mouth every 6 (six) hours as needed (Nausea or vomiting). 30 tablet 1  . capecitabine (XELODA) 500 MG tablet Take 4 tablets (2,000 mg total) by mouth 2 (two) times daily after a meal. Take on days 1-14 of chemotherapy. (  Patient not taking: Reported on 08/10/2016) 112 tablet 1   No current facility-administered medications for this visit.    Facility-Administered Medications Ordered in Other Visits  Medication Dose Route Frequency Provider Last Rate Last Dose  . sodium chloride flush (NS) 0.9 % injection 10 mL  10 mL Intravenous PRN Bethany Merle, MD   10 mL at 05/31/16 1303    REVIEW OF SYSTEMS: Constitutional: Denies fevers, chills or abnormal night sweats (+) fatigue Eyes: Denies blurriness of vision, double vision or watery eyes Ears, nose, mouth, throat, and face: Denies mucositis or sore throat Respiratory: Denies cough, dyspnea or wheezes Cardiovascular: Denies palpitation, chest discomfort or lower extremity swelling Gastrointestinal: Negtive (+) abdominal hernia Skin: Denies abnormal skin rashes. (+) dry skin Lymphatics: Denies new lymphadenopathy or easy bruising Neurological: (+) neuropathy in fingertips and feet Behavioral/Psych: Mood is stable, no new changes  All other systems were reviewed with the patient and are negative.  PHYSICAL EXAMINATION: ECOG PERFORMANCE STATUS: 1 VS reviewed, WNL GENERAL:alert, no distress and comfortable  SKIN: skin color, texture,  turgor are normal, no rashes or significant lesions EYES: normal, conjunctiva are pink and non-injected, sclera clear OROPHARYNX:no exudate, no erythema and lips, buccal mucosa, and tongue normal  NECK: supple, thyroid normal size, non-tender, without nodularity LYMPH:  no palpable lymphadenopathy in the cervical, axillary or inguinal LUNGS: clear to auscultation and percussion with normal breathing effort HEART: regular rate & rhythm and no murmurs and no lower extremity edema ABDOMEN:abdomen soft, non-tender and normal bowel sounds, midline incision has healed well, no discharge or skin erythema.  Musculoskeletal:no cyanosis of digits and no clubbing. (+) Bilateral ankle edema. PSYCH: alert & oriented x 3 with fluent speech NEURO: no focal motor/sensory deficits  LABORATORY DATA:  I have reviewed the data as listed CBC Latest Ref Rng & Units 08/10/2016 07/18/2016 06/29/2016  WBC 3.9 - 10.3 10e3/uL 4.3 6.2 4.5  Hemoglobin 11.6 - 15.9 g/dL 13.0 12.3 12.5  Hematocrit 34.8 - 46.6 % 38.4 36.5 37.7  Platelets 145 - 400 10e3/uL 148 157 168   CMP Latest Ref Rng & Units 08/10/2016 07/18/2016 06/29/2016  Glucose 70 - 140 mg/dl 113 101 111  BUN 7.0 - 26.0 mg/dL 15.9 16.6 11.1  Creatinine 0.6 - 1.1 mg/dL 0.7 0.7 0.6  Sodium 136 - 145 mEq/L 139 142 141  Potassium 3.5 - 5.1 mEq/L 4.2 3.9 4.0  Chloride 101 - 111 mmol/L - - -  CO2 22 - 29 mEq/L _0 Calcium 8.4 - 10.4 mg/dL 9.6 9.7 9.4  Total Protein 6.4 - 8.3 g/dL 6.2(L) 6.1(L) 6.1(L)  Total Bilirubin 0.20 - 1.20 mg/dL 0.72 0.57 0.87  Alkaline Phos 40 - 150 U/L 186(H) 197(H) 247(H)  AST 5 - 34 U/L _1 ALT 0 - 55 U/L _2 Results for TYNA, HUERTAS (MRN [id number]) as of 08/10/2016 10:09  Ref. Range 03/08/2016 09:51 03/29/2016 12:52 05/10/2016 12:20 06/29/2016 10:28 07/18/2016 07:36  CEA ([facility]) Latest Ref Range: 0.00 - 5.00 ng/mL 1.55 2.02 4.01 2.69 2.52    PATHOLOGY REPORT  Diagnosis 01/04/2016 Colon, biopsy, rectosigmoid  junction - ADENOCARCINOMA, SEE COMMENT.  Diagnosis 01/28/2016 1. Soft tissue, biopsy, Implant on sigmoid colon - BENIGN CALCIFIED NODULE. - NO EVIDENCE OF MALIGNANCY. 2. Colon, segmental resection for tumor, Recto-sigmoid - INVASIVE COLONIC ADENOCARCINOMA, 4.5 CM EXTENDING INTO PERICOLONIC CONNECTIVE TISSUE. - LYMPHATIC VASCULAR INVOLVEMENT BY TUMOR. - MARGINS NOT INVOLVED. - METASTATIC CARCINOMA IN 9 OF 21 LYMPH NODES (9/21). -  SEE ONCOLOGY TABLE BELOW. 3. Colon, resection margin (donut), Proximal and distal of sigmoid - BENIGN PROXIMAL AND DISTAL ANASTOMATIC RINGS. - NO EVIDENCE OF MALIGNANCY. Specimen Gross and Clinical Information  RADIOGRAPHIC STUDIES: I have personally reviewed the radiological images as listed and agreed with the findings in the report. No results found.   CT abdomen and pelvis with contrast 01/05/2016 IMPRESSION: 1. No dominant sigmoid colon mass identified. There is underdistention and soft tissue fullness at the rectosigmoid junction, suspicious for the primary lesion. Borderline enlarged nodes in the sigmoid mesocolon and at the origin of the inferior mesenteric artery are indeterminate. As these are in the drainage pathway for a sigmoid primary, localized nodal metastasis cannot be excluded. 2. No evidence of distant metastatic disease. 3.  Aortic atherosclerosis. 4. Uterine fibroid. 5. Left hepatic lobe giant hemangioma, as before.  CT chest 02/24/2016 IMPRESSION: 1. No evidence of pulmonary metastasis. 2. Stable large hepatic hemangioma.  Colonoscopy 01/04/2016 Dr. Watt Climes Findings: An ulcerated partially obstructing large mass was found in the rectosigmoid colon. The mass was partially circumferential, measured about 5 cm in length. Biopsy was taken. Multiple small milestone diverticula were found in the sigmoid colon. The exam otherwise was normal.   ASSESSMENT & PLAN:  66 y.o. Caucasian female, with recently diagnosed colorectal cancer    1. Colorectal cancer, invasive adenocarcinoma,pT3N2bM0, stage IIIC, MSI-stable  -Her primary cancer is located in the rectosigmoid colon. -I previously reviewed her staging scan findings with patient and her husband. -She has had complete surgical resection -Giving the multiple positive lymph nodes, and locally advanced disease, she is at high risk for recurrence. -she started adjuvant CAPOX, plan for 6 months -The goal of therapy is curative -She tolerated the cycle 4 chemotherapy poorly, had a significant prolonged fatigue, anorexia, constipation and cold sensitivity and requested de-escalation of her chemotherapy and oxaliplatin was held -She is tolerating single agent Xeloda very well  -Lab results reviewed with patient, CBC and CMP are within normal limits, adequate for treatment.  -If the patient continues to feel well, I would recommend completing 6 months of adjuvant therapy with Xeloda.  --I again discussed the risk of cancer recurrence in the future. I discussed the surveillance plan, which is a physical exam and lab test (including CBC, CMP and CEA) every 3 months for the first 2 years, then every 6-12 months, colonoscopy in one year, and surveilliance CT scan every 6-12 month for up to 5 year.  -Her skin has been dry. I encouraged her to moisturize well.  -Start 8th (and last) cycle Xeloda tonight. -Next CT scan after this treatment. We will plan to remove her port after her first surveillance CT scan.  2. 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 This is considered a benign breast lesion, not high risk for breast cancer. -I previously used the Baker Janus model to calculate her risk of developing breast cancer, Based on her age, family history, pregnancy history, etc, her risk of developing breast cancer is 3.3% in 5 years and 11.7% in her lifetime. This is consider moderate  risk. -We previously reviewed the risk factors for developing breast cancer, and strategies to prevent breast cancer, especially healthy diet, exercise, and maintain ideal weight.  -Chemoprevention with tamoxifen or anastrozole was discussed with her before, and she declined. -We previously discussed the breast cancer screening, including yearly screening mammogram with 3-D technology, self-exam every months and physician exam breast  exam once a year.   3. Constipation -I advised the patient to take a stool softener regularly, and uses laxative as needed.  4. 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  5. HTN -Her blood pressure has been slightly elevated on her recent visits, no prior history of hypertension -I suggest her to check blood pressure at home or local pharmacy -We'll continue monitoring  Plan -she will start cycle 8 Xeloda tonight (last cycle) -Flush port today. -Follow up in 6 weeks. -Labs, flush, and CT of abdomen, chest and pelvis with contrast one week before follow up.  All questions were answered. The patient knows to call the clinic with any problems, questions or concerns.  I spent 20 minutes counseling the patient face to face. The total time spent in the appointment was 25 minutes and more than 50% was on counseling.  Bethany Frederick  08/10/2016   This document serves as a record of services personally performed by Bethany Merle, MD. It was created on her behalf by Darcus Austin, a trained medical scribe. The creation of this record is based on the scribe's personal observations and the provider's statements to them. This document has been checked and approved by the attending provider.

## 2016-08-10 NOTE — Telephone Encounter (Signed)
Gave patient avs report and appointments for May. Central radiology will call re scan. °

## 2016-08-16 DIAGNOSIS — C187 Malignant neoplasm of sigmoid colon: Secondary | ICD-10-CM | POA: Diagnosis not present

## 2016-09-14 ENCOUNTER — Encounter (HOSPITAL_COMMUNITY): Payer: Self-pay

## 2016-09-14 ENCOUNTER — Other Ambulatory Visit (HOSPITAL_BASED_OUTPATIENT_CLINIC_OR_DEPARTMENT_OTHER): Payer: Federal, State, Local not specified - PPO

## 2016-09-14 ENCOUNTER — Ambulatory Visit: Payer: Federal, State, Local not specified - PPO

## 2016-09-14 ENCOUNTER — Ambulatory Visit (HOSPITAL_COMMUNITY)
Admission: RE | Admit: 2016-09-14 | Discharge: 2016-09-14 | Disposition: A | Discharge status: Home / Self Care | Payer: Federal, State, Local not specified - PPO | Source: Ambulatory Visit | Attending: Hematology | Admitting: Hematology

## 2016-09-14 DIAGNOSIS — C19 Malignant neoplasm of rectosigmoid junction: Secondary | ICD-10-CM | POA: Diagnosis not present

## 2016-09-14 DIAGNOSIS — C779 Secondary and unspecified malignant neoplasm of lymph node, unspecified: Secondary | ICD-10-CM | POA: Diagnosis not present

## 2016-09-14 DIAGNOSIS — D1803 Hemangioma of intra-abdominal structures: Secondary | ICD-10-CM | POA: Diagnosis not present

## 2016-09-14 DIAGNOSIS — K429 Umbilical hernia without obstruction or gangrene: Secondary | ICD-10-CM | POA: Insufficient documentation

## 2016-09-14 DIAGNOSIS — C20 Malignant neoplasm of rectum: Secondary | ICD-10-CM

## 2016-09-14 DIAGNOSIS — K5730 Diverticulosis of large intestine without perforation or abscess without bleeding: Secondary | ICD-10-CM | POA: Insufficient documentation

## 2016-09-14 DIAGNOSIS — C186 Malignant neoplasm of descending colon: Secondary | ICD-10-CM | POA: Insufficient documentation

## 2016-09-14 DIAGNOSIS — Z95828 Presence of other vascular implants and grafts: Secondary | ICD-10-CM

## 2016-09-14 DIAGNOSIS — D259 Leiomyoma of uterus, unspecified: Secondary | ICD-10-CM | POA: Insufficient documentation

## 2016-09-14 LAB — COMPREHENSIVE METABOLIC PANEL
ALT: 29 U/L (ref 0–55)
ANION GAP: 8 meq/L (ref 3–11)
AST: 29 U/L (ref 5–34)
Albumin: 3.9 g/dL (ref 3.5–5.0)
Alkaline Phosphatase: 152 U/L — ABNORMAL HIGH (ref 40–150)
BUN: 13.5 mg/dL (ref 7.0–26.0)
CALCIUM: 9.6 mg/dL (ref 8.4–10.4)
CHLORIDE: 107 meq/L (ref 98–109)
CO2: 26 mEq/L (ref 22–29)
Creatinine: 0.7 mg/dL (ref 0.6–1.1)
EGFR: >90 mL/min/{1.73_m2} (ref >90)
Glucose: 101 mg/dl (ref 70–140)
POTASSIUM: 4.2 meq/L (ref 3.5–5.1)
Sodium: 141 mEq/L (ref 136–145)
Total Bilirubin: 0.86 mg/dL (ref 0.20–1.20)
Total Protein: 6.5 g/dL (ref 6.4–8.3)

## 2016-09-14 LAB — CBC WITH DIFFERENTIAL/PLATELET
BASO%: 1.9 % (ref 0.0–2.0)
BASOS ABS: 0.1 10*3/uL (ref 0.0–0.1)
EOS%: 2.3 % (ref 0.0–7.0)
Eosinophils Absolute: 0.1 10*3/uL (ref 0.0–0.5)
HEMATOCRIT: 40.7 % (ref 34.8–46.6)
HGB: 13.3 g/dL (ref 11.6–15.9)
LYMPH#: 1.6 10*3/uL (ref 0.9–3.3)
LYMPH%: 29.9 % (ref 14.0–49.7)
MCH: 31.8 pg (ref 25.1–34.0)
MCHC: 32.7 g/dL (ref 31.5–36.0)
MCV: 97.4 fL (ref 79.5–101.0)
MONO#: 0.4 10*3/uL (ref 0.1–0.9)
MONO%: 8.3 % (ref 0.0–14.0)
NEUT#: 3.1 10*3/uL (ref 1.5–6.5)
NEUT%: 57.6 % (ref 38.4–76.8)
Platelets: 120 10*3/uL — ABNORMAL LOW (ref 145–400)
RBC: 4.18 10*6/uL (ref 3.70–5.45)
RDW: 18.9 % — ABNORMAL HIGH (ref 11.2–14.5)
WBC: 5.4 10*3/uL (ref 3.9–10.3)

## 2016-09-14 LAB — CEA (IN HOUSE-CHCC): CEA (CHCC-IN HOUSE): 2.82 ng/mL (ref 0.00–5.00)

## 2016-09-14 MED ORDER — IOPAMIDOL (ISOVUE-300) INJECTION 61%
INTRAVENOUS | Status: AC
  Administered 2016-09-14: 100 mL
  Filled 2016-09-14: qty 100

## 2016-09-14 MED ORDER — HEPARIN SOD (PORK) LOCK FLUSH 100 UNIT/ML IV SOLN
500.0000 [IU] | Freq: Once | INTRAVENOUS | Status: AC
  Administered 2016-09-14: 500 [IU] via INTRAVENOUS

## 2016-09-14 MED ORDER — HEPARIN SOD (PORK) LOCK FLUSH 100 UNIT/ML IV SOLN
INTRAVENOUS | Status: AC
  Filled 2016-09-14: qty 5

## 2016-09-14 MED ORDER — SODIUM CHLORIDE 0.9% FLUSH
10.0000 mL | INTRAVENOUS | Status: DC | PRN
  Administered 2016-09-14: 10 mL via INTRAVENOUS
  Filled 2016-09-14: qty 10

## 2016-09-14 NOTE — Patient Instructions (Signed)

## 2016-09-21 ENCOUNTER — Telehealth: Payer: Self-pay | Admitting: Hematology

## 2016-09-21 ENCOUNTER — Ambulatory Visit (HOSPITAL_BASED_OUTPATIENT_CLINIC_OR_DEPARTMENT_OTHER): Payer: Federal, State, Local not specified - PPO | Admitting: Hematology

## 2016-09-21 ENCOUNTER — Encounter: Payer: Self-pay | Admitting: Hematology

## 2016-09-21 VITALS — BP 128/67 | HR 71 | Temp 97.9°F | Resp 18 | Ht 67.0 in | Wt 185.5 lb

## 2016-09-21 DIAGNOSIS — C186 Malignant neoplasm of descending colon: Secondary | ICD-10-CM

## 2016-09-21 DIAGNOSIS — G620 Drug-induced polyneuropathy: Secondary | ICD-10-CM

## 2016-09-21 DIAGNOSIS — N6092 Unspecified benign mammary dysplasia of left breast: Secondary | ICD-10-CM

## 2016-09-21 DIAGNOSIS — C779 Secondary and unspecified malignant neoplasm of lymph node, unspecified: Secondary | ICD-10-CM | POA: Diagnosis not present

## 2016-09-21 DIAGNOSIS — C19 Malignant neoplasm of rectosigmoid junction: Secondary | ICD-10-CM

## 2016-09-21 DIAGNOSIS — N6022 Fibroadenosis of left breast: Secondary | ICD-10-CM

## 2016-09-21 DIAGNOSIS — K5900 Constipation, unspecified: Secondary | ICD-10-CM

## 2016-09-21 NOTE — Progress Notes (Signed)
[facility]  Telephone:(336) [phone] Fax:(336) [phone]  Clinic Follow up Note   Patient Care Team: [name], MD as PCP - General (Family Medicine) Jovita Kussmaul, MD as Consulting Physician (General Surgery) 09/21/2016   CHIEF COMPLAINTS:  Recently diagnosed stage III colon cancer   Oncology History   Rectal cancer [facility])   Staging form: Colon and Rectum, AJCC 7th Edition   - Pathologic stage from 01/28/2016: Stage IIIC (T3, N2b, cM0) - Signed by Truitt Merle, MD on 02/12/2016      Cancer of left colon ([place])   01/04/2016 Procedure    Colonoscopy by Dr. Estanislado Emms showed a partially obstructing tumor in the rectal sigmoid colon, biopsied.      01/05/2016 Imaging    CT abdomen and pelvis with contrast showed no dominant sigmoid colon mass, soft tissue fullness at the rectosigmoid junction, suspicious for the primary lesion. Borderline enlarged nodes in the sigmoid mesocolon and at the origin of the inferior mesenteric artery are indeterminate no evidence of December metastasis. Left hepatic lobe giant hemangioma.       01/28/2016 Initial Diagnosis    Rectal cancer ([place])     01/28/2016 Surgery    Endoscopic low anterior resection of rectosigmoid segmental colon for tumor       01/28/2016 Pathology Results    Invasive colonic adenocarcinoma, 4.5 cm extending into her chronic connective tissue, lymphovascular involvement by tumor, margins were negative, metastatic carcinoma in 9 of 21 lymph nodes.      02/24/2016 Imaging    CT chest w/o contrast IMPRESSION: 1. No evidence of pulmonary metastasis. 2. Stable large hepatic hemangioma.      03/08/2016 - 08/10/2016 Adjuvant Chemotherapy    Adjuvant chemotherapy with CAPOX (Xeloda 2000 mg twice daily, on day 1-14, oxaliplatin 161m/m2 on day 1) every 3 weeks,  Oxaliplatin held after 4 cycles and she had additional 4 cycles of Xeloda.       09/14/2016 Imaging    CT CAP W Contrast IMPRESSION: No evidence of locally  recurrent or metastatic carcinoma.  Small periumbilical hernia containing small bowel.  Stable 5 cm benign hemangioma in left hepatic lobe.  Stable small calcified uterine fibroid.  Colonic diverticulosis. No radiographic evidence of diverticulitis.       HISTORY OF PRESENTING ILLNESS (05/14/2015):  Bethany [id number].o. female is here to discussed her risk of breast cancer.   She has had palpable left breast mass for over 20 years, unchanged. Her mammogram in 2014 showed a 0.8 cm cyst. She does screening mammogram once a year. The lesion looks slightly more suspicious on the mammogram in August 2016, and a biopsy was recommended, which showed sclerosing lesion with typical ductal hyperplasia. She was referred to see Dr. TMarlou Starksand underwent left breast lumpectomy. She tolerated the surgery very well, and has recovered completely.  She feels well, has mild fatigue, she takes care two of her brothers, who live alone and have health issues. She lives with her hsuband and 281yo son. She remains physically active, does not exercise regularly. She has mild arthritis, takes ibuprofen and Aleve as needed, no other complaints.  GYN HISTORY  Menarchal: 10 LMP: 527Contraceptive: 24 years  HRT: 5 years, stopped at age of 89 G1P1: one son, she was 89yo when she had her son   CURRENT THERAPY: Surveillance  IArimoreturns for follow-up. She presents to the clinic today with her son. She has been doing well. She  reports her numbness in the toes are slowly recovering. She over all she is recovering from chemo therapy She denies pain, cramps, or bleeding. She has had regular BM. She has been urinating more often and once in a while it will be painful. She has swelling in her feet.    MEDICAL HISTORY:  Past Medical History:  Diagnosis Date  . Arthritis    back, hip, feet  . Cancer ([place])    Sigmoid colon  . Cataract, immature   . GERD (gastroesophageal reflux disease)   .  Heart palpitations   . 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 Seasonal allergies   . Wears partial dentures    upper front - 1 tooth    SURGICAL HISTORY: Past Surgical History:  Procedure Laterality Date  . APPENDECTOMY    . BREAST LUMPECTOMY WITH RADIOACTIVE SEED LOCALIZATION Left 03/13/2015   Procedure: BREAST LUMPECTOMY WITH RADIOACTIVE SEED LOCALIZATION;  Surgeon: Autumn Messing III, MD;  Location: [place];  Service: General;  Laterality: Left;  . COLONOSCOPY    . ENDOMETRIAL FULGURATION  06/13/2000  . INGUINAL HERNIA REPAIR  03/05/2012   Procedure: HERNIA REPAIR INGUINAL INCARCERATED;  Surgeon: Ralene Ok, MD;  Location: [place];  Service: General;  Laterality: N/A;  . INSERTION OF MESH  03/05/2012   Procedure: INSERTION OF MESH;  Surgeon: Ralene Ok, MD;  Location: [place];  Service: General;  Laterality: Left;  . LAPAROSCOPIC SIGMOID COLECTOMY Bilateral 01/28/2016   Procedure: LAPAROSCOPIC ASSISTED SIGMOID COLECTOMY;  Surgeon: Autumn Messing III, MD;  Location: [place];  Service: General;  Laterality: Bilateral;  . MYOMECTOMY  06/13/2000  . PORTACATH PLACEMENT N/A 02/18/2016   Procedure: INSERTION PORT-A-CATH;  Surgeon: Autumn Messing III, MD;  Location: [place];  Service: General;  Laterality: N/A;  . ROTATOR CUFF REPAIR Left    2013    SOCIAL HISTORY: Social History   Social History  . Marital status: Married    Spouse name: N/A  . Number of children: N/A  . Years of education: N/A   Occupational History  . Not on file.   Social History Main Topics  . Smoking status: Never Smoker  . Smokeless tobacco: Never Used  . Alcohol use Yes     Comment: occasionally  . Drug use: No  . Sexual activity: Not Currently   Other Topics Concern  . Not on file   Social History Narrative  . No narrative on file    FAMILY HISTORY: Family History  Problem Relation Age of Onset  . Cancer Brother 27       prostate cancer   . Cancer Paternal Aunt  37       breast cancer   . Cancer Cousin 40       ovarian and breast cancer (maternal cousin)     ALLERGIES:  is allergic to no known allergies.  MEDICATIONS:  Current Outpatient Prescriptions  Medication Sig Dispense Refill  . clobetasol cream (TEMOVATE) 4.09 % Apply 1 application topically daily as needed (RASH).     . famotidine (PEPCID) 20 MG tablet Take 20 mg by mouth 2 (two) times daily as needed for heartburn or indigestion.   0  . lidocaine-prilocaine (EMLA) cream Apply 1 application topically as needed. Apply to port site 1 hour prior to stick and cover w/plastic wrap 30 g 11  . ondansetron (ZOFRAN) 8 MG tablet Take 1 tablet (8 mg total) by mouth 2 (two) times daily as needed for refractory  nausea / vomiting. Start on day 3 after chemotherapy. (Patient not taking: Reported on 09/21/2016) 30 tablet 1  . prochlorperazine (COMPAZINE) 10 MG tablet Take 1 tablet (10 mg total) by mouth every 6 (six) hours as needed (Nausea or vomiting). (Patient not taking: Reported on 09/21/2016) 30 tablet 1   No current facility-administered medications for this visit.    Facility-Administered Medications Ordered in Other Visits  Medication Dose Route Frequency Provider Last Rate Last Dose  . sodium chloride flush (NS) 0.9 % injection 10 mL  10 mL Intravenous PRN Truitt Merle, MD   10 mL at 05/31/16 1303    REVIEW OF SYSTEMS: Constitutional: Denies fevers, chills or abnormal night sweats (+) fatigue Eyes: Denies blurriness of vision, double vision or watery eyes Ears, nose, mouth, throat, and face: Denies mucositis or sore throat Respiratory: Denies cough, dyspnea or wheezes Cardiovascular: Denies palpitation, chest discomfort (+) feet/ankle swelling Gastrointestinal: Negtive (+) abdominal hernia Urinary: (+) Polyuria (+) occasional dysuria Skin: Denies abnormal skin rashes. (+) dry skin Lymphatics: Denies new lymphadenopathy or easy bruising Neurological: (+) neuropathy in fingertips and  feet Behavioral/Psych: Mood is stable, no new changes  All other systems were reviewed with the patient and are negative.  PHYSICAL EXAMINATION: ECOG PERFORMANCE STATUS: 1 BP 128/67 (BP Location: Left Arm)   Pulse 71   Temp 97.9 F (36.6 C) (Oral)   Resp 18   Ht _0  (1.702 m)   Wt 185 lb 8 oz (84.1 kg)   SpO2 97%   BMI 29.05 kg/m   GENERAL:alert, no distress and comfortable  SKIN: skin color, texture, turgor are normal, no rashes or significant lesions EYES: normal, conjunctiva are pink and non-injected, sclera clear OROPHARYNX:no exudate, no erythema and lips, buccal mucosa, and tongue normal  NECK: supple, thyroid normal size, non-tender, without nodularity LYMPH:  no palpable lymphadenopathy in the cervical, axillary or inguinal LUNGS: clear to auscultation and percussion with normal breathing effort HEART: regular rate & rhythm and no murmurs and no lower extremity edema ABDOMEN:abdomen soft, non-tender and normal bowel sounds, midline incision has healed well, no discharge or skin erythema.  Musculoskeletal:no cyanosis of digits and no clubbing. (+) Bilateral ankle edema. PSYCH: alert & oriented x 3 with fluent speech NEURO: no focal motor/sensory deficits  LABORATORY DATA:  I have reviewed the data as listed CBC Latest Ref Rng & Units 09/14/2016 08/10/2016 07/18/2016  WBC 3.9 - 10.3 10e3/uL 5.4 4.3 6.2  Hemoglobin 11.6 - 15.9 g/dL 13.3 13.0 12.3  Hematocrit 34.8 - 46.6 % 40.7 38.4 36.5  Platelets 145 - 400 10e3/uL 120(L) 148 157   CMP Latest Ref Rng & Units 09/14/2016 08/10/2016 07/18/2016  Glucose 70 - 140 mg/dl 101 113 101  BUN 7.0 - 26.0 mg/dL 13.5 15.9 16.6  Creatinine 0.6 - 1.1 mg/dL 0.7 0.7 0.7  Sodium 136 - 145 mEq/L 141 139 142  Potassium 3.5 - 5.1 mEq/L 4.2 4.2 3.9  Chloride 101 - 111 mmol/L - - -  CO2 22 - 29 mEq/L _1 Calcium 8.4 - 10.4 mg/dL 9.6 9.6 9.7  Total Protein 6.4 - 8.3 g/dL 6.5 6.2(L) 6.1(L)  Total Bilirubin 0.20 - 1.20 mg/dL 0.86 0.72 0.57   Alkaline Phos 40 - 150 U/L 152(H) 186(H) 197(H)  AST 5 - 34 U/L _2 ALT 0 - 55 U/L _3 Results for SYRAI, GLADWIN (MRN [id number]) as of 09/21/2016 13:49  Ref. Range 06/29/2016 10:28 07/18/2016 07:36 09/14/2016 12:31  CEA ([facility]) Latest Ref Range: 0.00 - 5.00 ng/mL 2.69 2.52 2.82     PATHOLOGY REPORT  Diagnosis 01/04/2016 Colon, biopsy, rectosigmoid junction - ADENOCARCINOMA, SEE COMMENT.  Diagnosis 01/28/2016 1. Soft tissue, biopsy, Implant on sigmoid colon - BENIGN CALCIFIED NODULE. - NO EVIDENCE OF MALIGNANCY. 2. Colon, segmental resection for tumor, Recto-sigmoid - INVASIVE COLONIC ADENOCARCINOMA, 4.5 CM EXTENDING INTO PERICOLONIC CONNECTIVE TISSUE. - LYMPHATIC VASCULAR INVOLVEMENT BY TUMOR. - MARGINS NOT INVOLVED. - METASTATIC CARCINOMA IN 9 OF 21 LYMPH NODES (9/21). - SEE ONCOLOGY TABLE BELOW. 3. Colon, resection margin (donut), Proximal and distal of sigmoid - BENIGN PROXIMAL AND DISTAL ANASTOMATIC RINGS. - NO EVIDENCE OF MALIGNANCY. Specimen Gross and Clinical Information  RADIOGRAPHIC STUDIES: I have personally reviewed the radiological images as listed and agreed with the findings in the report. Ct Chest W Contrast  Result Date: 09/14/2016 CLINICAL DATA:  Followup left colon carcinoma. Status post surgery and chemotherapy. EXAM: CT CHEST, ABDOMEN, AND PELVIS WITH CONTRAST TECHNIQUE: Multidetector CT imaging of the chest, abdomen and pelvis was performed following the standard protocol during bolus administration of intravenous contrast. CONTRAST:  163m ISOVUE-300 IOPAMIDOL (ISOVUE-300) INJECTION 61% COMPARISON:  Chest CT on 02/24/2016 and AP CT on 01/05/2016 FINDINGS: CT CHEST FINDINGS Cardiovascular: No acute findings. Aortic and coronary artery atherosclerosis. Mediastinum/Lymph Nodes: No masses or pathologically enlarged lymph nodes identified. Lungs/Pleura: No pulmonary infiltrate or mass identified. No effusion present. Stable bilateral lower lobe  scarring. Musculoskeletal:  No suspicious bone lesions identified. CT ABDOMEN AND PELVIS FINDINGS Hepatobiliary: Stable 5 cm benign hemangioma in the left hepatic lobe. No other liver masses are identified. Gallbladder is unremarkable. Pancreas:  No mass or inflammatory changes. Spleen:  Within normal limits in size and appearance. Adrenals/Urinary tract: No masses or hydronephrosis. Stable small cyst in lower pole of right kidney. Unremarkable unopacified urinary bladder. Stomach/Bowel: Surgical anastomosis again seen at the rectosigmoid junction. No recurrent mass identified. Diverticulosis is seen involving the descending and sigmoid colon, however there is no evidence of diverticulitis. No evidence of obstruction, inflammatory process, or abnormal fluid collections. Vascular/Lymphatic: No pathologically enlarged lymph nodes identified. No abdominal aortic aneurysm. Aortic atherosclerosis. Reproductive: Stable calcified subserosal fibroid in right anterior uterus measuring 2.3 cm. No adnexal mass or free fluid identified. Other: Small periumbilical hernia seen containing a loop of small bowel. No evidence of small bowel obstruction or ischemia. Musculoskeletal:  No suspicious bone lesions identified. IMPRESSION: No evidence of locally recurrent or metastatic carcinoma. Small periumbilical hernia containing small bowel. Stable 5 cm benign hemangioma in left hepatic lobe. Stable small calcified uterine fibroid. Colonic diverticulosis. No radiographic evidence of diverticulitis. Electronically Signed   By: JEarle GellM.D.   On: 09/14/2016 17:01   Ct Abdomen Pelvis W Contrast  Result Date: 09/14/2016 CLINICAL DATA:  Followup left colon carcinoma. Status post surgery and chemotherapy. EXAM: CT CHEST, ABDOMEN, AND PELVIS WITH CONTRAST TECHNIQUE: Multidetector CT imaging of the chest, abdomen and pelvis was performed following the standard protocol during bolus administration of intravenous contrast. CONTRAST:   1014mISOVUE-300 IOPAMIDOL (ISOVUE-300) INJECTION 61% COMPARISON:  Chest CT on 02/24/2016 and AP CT on 01/05/2016 FINDINGS: CT CHEST FINDINGS Cardiovascular: No acute findings. Aortic and coronary artery atherosclerosis. Mediastinum/Lymph Nodes: No masses or pathologically enlarged lymph nodes identified. Lungs/Pleura: No pulmonary infiltrate or mass identified. No effusion present. Stable bilateral lower lobe scarring. Musculoskeletal:  No suspicious bone lesions identified. CT ABDOMEN AND PELVIS FINDINGS Hepatobiliary: Stable 5 cm benign hemangioma in the left hepatic lobe. No other liver masses  are identified. Gallbladder is unremarkable. Pancreas:  No mass or inflammatory changes. Spleen:  Within normal limits in size and appearance. Adrenals/Urinary tract: No masses or hydronephrosis. Stable small cyst in lower pole of right kidney. Unremarkable unopacified urinary bladder. Stomach/Bowel: Surgical anastomosis again seen at the rectosigmoid junction. No recurrent mass identified. Diverticulosis is seen involving the descending and sigmoid colon, however there is no evidence of diverticulitis. No evidence of obstruction, inflammatory process, or abnormal fluid collections. Vascular/Lymphatic: No pathologically enlarged lymph nodes identified. No abdominal aortic aneurysm. Aortic atherosclerosis. Reproductive: Stable calcified subserosal fibroid in right anterior uterus measuring 2.3 cm. No adnexal mass or free fluid identified. Other: Small periumbilical hernia seen containing a loop of small bowel. No evidence of small bowel obstruction or ischemia. Musculoskeletal:  No suspicious bone lesions identified. IMPRESSION: No evidence of locally recurrent or metastatic carcinoma. Small periumbilical hernia containing small bowel. Stable 5 cm benign hemangioma in left hepatic lobe. Stable small calcified uterine fibroid. Colonic diverticulosis. No radiographic evidence of diverticulitis. Electronically Signed   By:  Earle Gell M.D.   On: 09/14/2016 17:01     CT abdomen and pelvis with contrast 01/05/2016 IMPRESSION: 1. No dominant sigmoid colon mass identified. There is underdistention and soft tissue fullness at the rectosigmoid junction, suspicious for the primary lesion. Borderline enlarged nodes in the sigmoid mesocolon and at the origin of the inferior mesenteric artery are indeterminate. As these are in the drainage pathway for a sigmoid primary, localized nodal metastasis cannot be excluded. 2. No evidence of distant metastatic disease. 3.  Aortic atherosclerosis. 4. Uterine fibroid. 5. Left hepatic lobe giant hemangioma, as before.  CT chest 02/24/2016 IMPRESSION: 1. No evidence of pulmonary metastasis. 2. Stable large hepatic hemangioma.  Colonoscopy 01/04/2016 Dr. Watt Climes Findings: An ulcerated partially obstructing large mass was found in the rectosigmoid colon. The mass was partially circumferential, measured about 5 cm in length. Biopsy was taken. Multiple small milestone diverticula were found in the sigmoid colon. The exam otherwise was normal.   ASSESSMENT & PLAN:  66 y.o. Caucasian female, with recently diagnosed colorectal cancer   1. Colorectal cancer, invasive adenocarcinoma,pT3N2bM0, stage IIIC, MSI-stable  -Her primary cancer is located in the rectosigmoid colon. -I previously reviewed her staging scan findings with patient and her husband. -She has had complete surgical resection -Giving the multiple positive lymph nodes, and locally advanced disease, she is at high risk for recurrence. -she received adjuvant chemotherapy CAPOX for 8 cycles, oxaliplatin was held after cycle 4 due to poor tolerance. -She has recovered well from adjuvant chemotherapy, with mild residual neuropathy. -I reviewed her surveillance CT scan, which showed no evidence of recurrence -Labs reviewed Her CEA and kidney/liver function is normal, her examination is unremarkable, no clinical concern of  recurrence. -Will send message to Dr.Toth about getting port removed. She will also get hernia resolved at the same time -Continue colon cancer surveillance -she will get colonoscopy in August  -I encouraged her to exercise and have a healthy diet   2. 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 This is considered a benign breast lesion, not high risk for breast cancer. -Continue annual screening mammogram, self exam and routine follow-up.  3. Constipation -I advised the patient to take a stool softener regularly, and uses laxative as needed.  4. 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  Plan -Lab and CT scan reviewed, NED  -Continue surveillance, labs and f/u in 3 months -remove port and get hernia repaired by Dr. Marlou Starks, I sent a message to Dr. Marlou Starks -Repeat colonoscopy in August 2018   All questions were answered. The patient knows to call the clinic with any problems, questions or concerns.  I spent 20 minutes counseling the patient face to face. The total time spent in the appointment was 25 minutes and more than 50% was on counseling.  Truitt Merle  09/21/2016   This document serves as a record of services personally performed by Truitt Merle, MD. It was created on her behalf by Joslyn Devon, a trained medical scribe. The creation of this record is based on the scribe's personal observations and the provider's statements to them. This document has been checked and approved by the attending provider.

## 2016-09-21 NOTE — Telephone Encounter (Signed)
Gave patient AVS and calender per 5/16 los. Lab and f.u in three months.

## 2016-10-04 DIAGNOSIS — H353131 Nonexudative age-related macular degeneration, bilateral, early dry stage: Secondary | ICD-10-CM | POA: Diagnosis not present

## 2016-10-04 DIAGNOSIS — H52223 Regular astigmatism, bilateral: Secondary | ICD-10-CM | POA: Diagnosis not present

## 2016-10-04 DIAGNOSIS — H04123 Dry eye syndrome of bilateral lacrimal glands: Secondary | ICD-10-CM | POA: Diagnosis not present

## 2016-10-04 DIAGNOSIS — H524 Presbyopia: Secondary | ICD-10-CM | POA: Diagnosis not present

## 2016-10-04 DIAGNOSIS — H2513 Age-related nuclear cataract, bilateral: Secondary | ICD-10-CM | POA: Diagnosis not present

## 2016-10-04 DIAGNOSIS — H40023 Open angle with borderline findings, high risk, bilateral: Secondary | ICD-10-CM | POA: Diagnosis not present

## 2016-10-14 ENCOUNTER — Telehealth: Payer: Self-pay | Admitting: Hematology

## 2016-10-14 NOTE — Telephone Encounter (Signed)
6/15 flush appt at 2 pm per MD

## 2016-10-21 ENCOUNTER — Ambulatory Visit (HOSPITAL_BASED_OUTPATIENT_CLINIC_OR_DEPARTMENT_OTHER): Payer: Federal, State, Local not specified - PPO

## 2016-10-21 DIAGNOSIS — C779 Secondary and unspecified malignant neoplasm of lymph node, unspecified: Secondary | ICD-10-CM

## 2016-10-21 DIAGNOSIS — Z95828 Presence of other vascular implants and grafts: Secondary | ICD-10-CM

## 2016-10-21 DIAGNOSIS — Z452 Encounter for adjustment and management of vascular access device: Secondary | ICD-10-CM

## 2016-10-21 DIAGNOSIS — C179 Malignant neoplasm of small intestine, unspecified: Secondary | ICD-10-CM | POA: Diagnosis not present

## 2016-10-21 DIAGNOSIS — C186 Malignant neoplasm of descending colon: Secondary | ICD-10-CM

## 2016-10-21 MED ORDER — HEPARIN SOD (PORK) LOCK FLUSH 100 UNIT/ML IV SOLN
500.0000 [IU] | Freq: Once | INTRAVENOUS | Status: AC | PRN
  Administered 2016-10-21: 500 [IU] via INTRAVENOUS
  Filled 2016-10-21: qty 5

## 2016-10-21 MED ORDER — SODIUM CHLORIDE 0.9% FLUSH
10.0000 mL | INTRAVENOUS | Status: DC | PRN
  Administered 2016-10-21: 10 mL via INTRAVENOUS
  Filled 2016-10-21: qty 10

## 2016-11-24 ENCOUNTER — Ambulatory Visit (HOSPITAL_BASED_OUTPATIENT_CLINIC_OR_DEPARTMENT_OTHER): Payer: Federal, State, Local not specified - PPO

## 2016-11-24 DIAGNOSIS — C19 Malignant neoplasm of rectosigmoid junction: Secondary | ICD-10-CM | POA: Diagnosis not present

## 2016-11-24 DIAGNOSIS — Z452 Encounter for adjustment and management of vascular access device: Secondary | ICD-10-CM | POA: Diagnosis not present

## 2016-11-24 DIAGNOSIS — C186 Malignant neoplasm of descending colon: Secondary | ICD-10-CM

## 2016-11-24 DIAGNOSIS — C779 Secondary and unspecified malignant neoplasm of lymph node, unspecified: Secondary | ICD-10-CM

## 2016-11-24 DIAGNOSIS — Z95828 Presence of other vascular implants and grafts: Secondary | ICD-10-CM

## 2016-11-24 MED ORDER — HEPARIN SOD (PORK) LOCK FLUSH 100 UNIT/ML IV SOLN
500.0000 [IU] | Freq: Once | INTRAVENOUS | Status: AC | PRN
  Administered 2016-11-24: 500 [IU] via INTRAVENOUS
  Filled 2016-11-24: qty 5

## 2016-11-24 MED ORDER — SODIUM CHLORIDE 0.9% FLUSH
10.0000 mL | INTRAVENOUS | Status: DC | PRN
  Administered 2016-11-24: 10 mL via INTRAVENOUS
  Filled 2016-11-24: qty 10

## 2016-12-29 ENCOUNTER — Ambulatory Visit (HOSPITAL_BASED_OUTPATIENT_CLINIC_OR_DEPARTMENT_OTHER): Payer: Federal, State, Local not specified - PPO | Admitting: Hematology

## 2016-12-29 ENCOUNTER — Telehealth: Payer: Self-pay | Admitting: Hematology

## 2016-12-29 ENCOUNTER — Other Ambulatory Visit (HOSPITAL_BASED_OUTPATIENT_CLINIC_OR_DEPARTMENT_OTHER): Payer: Federal, State, Local not specified - PPO

## 2016-12-29 VITALS — BP 128/54 | HR 68 | Temp 98.0°F | Resp 17 | Ht 67.0 in | Wt 190.8 lb

## 2016-12-29 DIAGNOSIS — C20 Malignant neoplasm of rectum: Secondary | ICD-10-CM

## 2016-12-29 DIAGNOSIS — C779 Secondary and unspecified malignant neoplasm of lymph node, unspecified: Secondary | ICD-10-CM | POA: Diagnosis not present

## 2016-12-29 DIAGNOSIS — G620 Drug-induced polyneuropathy: Secondary | ICD-10-CM

## 2016-12-29 DIAGNOSIS — C186 Malignant neoplasm of descending colon: Secondary | ICD-10-CM

## 2016-12-29 DIAGNOSIS — C179 Malignant neoplasm of small intestine, unspecified: Secondary | ICD-10-CM

## 2016-12-29 DIAGNOSIS — N6022 Fibroadenosis of left breast: Secondary | ICD-10-CM | POA: Diagnosis not present

## 2016-12-29 DIAGNOSIS — C19 Malignant neoplasm of rectosigmoid junction: Secondary | ICD-10-CM | POA: Diagnosis not present

## 2016-12-29 LAB — COMPREHENSIVE METABOLIC PANEL
ALT: 22 U/L (ref 0–55)
ANION GAP: 6 meq/L (ref 3–11)
AST: 19 U/L (ref 5–34)
Albumin: 3.8 g/dL (ref 3.5–5.0)
Alkaline Phosphatase: 176 U/L — ABNORMAL HIGH (ref 40–150)
BUN: 20.4 mg/dL (ref 7.0–26.0)
CALCIUM: 9.5 mg/dL (ref 8.4–10.4)
CHLORIDE: 106 meq/L (ref 98–109)
CO2: 26 mEq/L (ref 22–29)
Creatinine: 0.7 mg/dL (ref 0.6–1.1)
EGFR: 88 mL/min/{1.73_m2} — AB (ref >90)
Glucose: 102 mg/dl (ref 70–140)
POTASSIUM: 4.7 meq/L (ref 3.5–5.1)
Sodium: 138 mEq/L (ref 136–145)
Total Bilirubin: 0.57 mg/dL (ref 0.20–1.20)
Total Protein: 6.7 g/dL (ref 6.4–8.3)

## 2016-12-29 LAB — CBC WITH DIFFERENTIAL/PLATELET
BASO%: 1.3 % (ref 0.0–2.0)
BASOS ABS: 0.1 10*3/uL (ref 0.0–0.1)
EOS%: 4.6 % (ref 0.0–7.0)
Eosinophils Absolute: 0.2 10*3/uL (ref 0.0–0.5)
HEMATOCRIT: 42.9 % (ref 34.8–46.6)
HGB: 14.2 g/dL (ref 11.6–15.9)
LYMPH#: 1.3 10*3/uL (ref 0.9–3.3)
LYMPH%: 30.1 % (ref 14.0–49.7)
MCH: 29.3 pg (ref 25.1–34.0)
MCHC: 33 g/dL (ref 31.5–36.0)
MCV: 88.7 fL (ref 79.5–101.0)
MONO#: 0.4 10*3/uL (ref 0.1–0.9)
MONO%: 9.6 % (ref 0.0–14.0)
NEUT#: 2.4 10*3/uL (ref 1.5–6.5)
NEUT%: 54.4 % (ref 38.4–76.8)
PLATELETS: 139 10*3/uL — AB (ref 145–400)
RBC: 4.84 10*6/uL (ref 3.70–5.45)
RDW: 14 % (ref 11.2–14.5)
WBC: 4.4 10*3/uL (ref 3.9–10.3)

## 2016-12-29 LAB — CEA (IN HOUSE-CHCC): CEA (CHCC-IN HOUSE): 2.53 ng/mL (ref 0.00–5.00)

## 2016-12-29 NOTE — Progress Notes (Signed)
Bethany Frederick  [phone] [phone]  Clinic Follow up Note   Patient Care Team: [name], MD as PCP - General (Family Medicine) Jovita Kussmaul, MD as Consulting Physician (General Surgery) 12/29/2016   CHIEF COMPLAINTS:  F/u stage III colon cancer   Oncology History   Rectal cancer [facility])   Staging form: Colon and Rectum, AJCC 7th Edition   - Pathologic stage from 01/28/2016: Stage IIIC (T3, N2b, cM0) - Signed by Truitt Merle, MD on 02/12/2016      Cancer of left colon ([place])   01/04/2016 Procedure    Colonoscopy by Dr. Estanislado Emms showed a partially obstructing tumor in the rectal sigmoid colon, biopsied.      01/05/2016 Imaging    CT abdomen and pelvis with contrast showed no dominant sigmoid colon mass, soft tissue fullness at the rectosigmoid junction, suspicious for the primary lesion. Borderline enlarged nodes in the sigmoid mesocolon and at the origin of the inferior mesenteric artery are indeterminate no evidence of December metastasis. Left hepatic lobe giant hemangioma.       01/28/2016 Initial Diagnosis    Rectal cancer ([place])     01/28/2016 Surgery    Endoscopic low anterior resection of rectosigmoid segmental colon for tumor       01/28/2016 Pathology Results    Invasive colonic adenocarcinoma, 4.5 cm extending into her chronic connective tissue, lymphovascular involvement by tumor, margins were negative, metastatic carcinoma in 9 of 21 lymph nodes.      02/24/2016 Imaging    CT chest w/o contrast IMPRESSION: 1. No evidence of pulmonary metastasis. 2. Stable large hepatic hemangioma.      03/08/2016 - 08/10/2016 Adjuvant Chemotherapy    Adjuvant chemotherapy with CAPOX (Xeloda 2000 mg twice daily, on day 1-14, oxaliplatin 165m/m2 on day 1) every 3 weeks,  Oxaliplatin held after 4 cycles and she had additional 4 cycles of Xeloda.       09/14/2016 Imaging    CT CAP W Contrast IMPRESSION: No evidence of locally recurrent or  metastatic carcinoma.  Small periumbilical hernia containing small bowel.  Stable 5 cm benign hemangioma in left hepatic lobe.  Stable small calcified uterine fibroid.  Colonic diverticulosis. No radiographic evidence of diverticulitis.      09/14/2016 Imaging    CT CAP w contrast  IMPRESSION: No evidence of locally recurrent or metastatic carcinoma.  Small periumbilical hernia containing small bowel.  Stable 5 cm benign hemangioma in left hepatic lobe.  Stable small calcified uterine fibroid.  Colonic diverticulosis. No radiographic evidence of diverticulitis.       HISTORY OF PRESENTING ILLNESS (05/14/2015):  Bethany CARRENO66y.o. female is here to discussed her risk of breast cancer.   She has had palpable left breast mass for over 20 years, unchanged. Her mammogram in 2014 showed a 0.8 cm cyst. She does screening mammogram once a year. The lesion looks slightly more suspicious on the mammogram in August 2016, and a biopsy was recommended, which showed sclerosing lesion with typical ductal hyperplasia. She was referred to see Dr. TMarlou Starksand underwent left breast lumpectomy. She tolerated the surgery very well, and has recovered completely.  She feels well, has mild fatigue, she takes care two of her brothers, who live alone and have health issues. She lives with her hsuband and 261yo son. She remains physically active, does not exercise regularly. She has mild arthritis, takes ibuprofen and Aleve as needed, no other complaints.  GYN HISTORY  Menarchal: 10 LMP:  7 Contraceptive: 24 years  HRT: 5 years, stopped at age of 62  G1P1: one son, she was 66 yo when she had her son   CURRENT THERAPY: Surveillance  Vesta returns for follow-up. She presents to the clinic today with her son. She has been doing well. She has recovered well from chemotherapy. She has mild residual numbness on her toes, neuropathy on fingers has resolved. Overall improving. Her  appetite and energy level has recovered very well. She denies any significant pain, nausea, bloating, or other complaints.   MEDICAL HISTORY:  Past Medical History:  Diagnosis Date  . Arthritis    back, hip, feet  . Cancer ([place])    Sigmoid colon  . Cataract, immature   . GERD (gastroesophageal reflux disease)   . Heart palpitations   . 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 Seasonal allergies   . Wears partial dentures    upper front - 1 tooth    SURGICAL HISTORY: Past Surgical History:  Procedure Laterality Date  . APPENDECTOMY    . BREAST LUMPECTOMY WITH RADIOACTIVE SEED LOCALIZATION Left 03/13/2015   Procedure: BREAST LUMPECTOMY WITH RADIOACTIVE SEED LOCALIZATION;  Surgeon: Autumn Messing III, MD;  Location: [place];  Service: General;  Laterality: Left;  . COLONOSCOPY    . ENDOMETRIAL FULGURATION  06/13/2000  . INGUINAL HERNIA REPAIR  03/05/2012   Procedure: HERNIA REPAIR INGUINAL INCARCERATED;  Surgeon: Ralene Ok, MD;  Location: [place];  Service: General;  Laterality: N/A;  . INSERTION OF MESH  03/05/2012   Procedure: INSERTION OF MESH;  Surgeon: Ralene Ok, MD;  Location: [place];  Service: General;  Laterality: Left;  . LAPAROSCOPIC SIGMOID COLECTOMY Bilateral 01/28/2016   Procedure: LAPAROSCOPIC ASSISTED SIGMOID COLECTOMY;  Surgeon: Autumn Messing III, MD;  Location: [place];  Service: General;  Laterality: Bilateral;  . MYOMECTOMY  06/13/2000  . PORTACATH PLACEMENT N/A 02/18/2016   Procedure: INSERTION PORT-A-CATH;  Surgeon: Autumn Messing III, MD;  Location: [place];  Service: General;  Laterality: N/A;  . ROTATOR CUFF REPAIR Left    2013    SOCIAL HISTORY: Social History   Social History  . Marital status: Married    Spouse name: N/A  . Number of children: N/A  . Years of education: N/A   Occupational History  . Not on file.   Social History Main Topics  . Smoking status: Never Smoker  . Smokeless tobacco: Never Used  .  Alcohol use Yes     Comment: occasionally  . Drug use: No  . Sexual activity: Not Currently   Other Topics Concern  . Not on file   Social History Narrative  . No narrative on file    FAMILY HISTORY: Family History  Problem Relation Age of Onset  . Cancer Brother 64       prostate cancer   . Cancer Paternal Aunt 46       breast cancer   . Cancer Cousin 40       ovarian and breast cancer (maternal cousin)     ALLERGIES:  is allergic to no known allergies.  MEDICATIONS:  Current Outpatient Prescriptions  Medication Sig Dispense Refill  . clobetasol cream (TEMOVATE) 0.73 % Apply 1 application topically daily as needed (RASH).     . famotidine (PEPCID) 20 MG tablet Take 20 mg by mouth 2 (two) times daily as needed for heartburn or indigestion.   0  . lidocaine-prilocaine (EMLA) cream Apply 1 application  topically as needed. Apply to port site 1 hour prior to stick and cover w/plastic wrap 30 g 11  . ondansetron (ZOFRAN) 8 MG tablet Take 1 tablet (8 mg total) by mouth 2 (two) times daily as needed for refractory nausea / vomiting. Start on day 3 after chemotherapy. (Patient not taking: Reported on 09/21/2016) 30 tablet 1  . prochlorperazine (COMPAZINE) 10 MG tablet Take 1 tablet (10 mg total) by mouth every 6 (six) hours as needed (Nausea or vomiting). (Patient not taking: Reported on 09/21/2016) 30 tablet 1   No current facility-administered medications for this visit.    Facility-Administered Medications Ordered in Other Visits  Medication Dose Route Frequency Provider Last Rate Last Dose  . sodium chloride flush (NS) 0.9 % injection 10 mL  10 mL Intravenous PRN Truitt Merle, MD   10 mL at 05/31/16 1303    REVIEW OF SYSTEMS:  Constitutional: Denies fevers, chills or abnormal night sweats (+) fatigue Eyes: Denies blurriness of vision, double vision or watery eyes Ears, nose, mouth, throat, and face: Denies mucositis or sore throat Respiratory: Denies cough, dyspnea or  wheezes Cardiovascular: Denies palpitation, chest discomfort (+) feet/ankle swelling Gastrointestinal: Negtive (+) abdominal hernia Urinary: (+) Polyuria (+) occasional dysuria Skin: Denies abnormal skin rashes. (+) dry skin Lymphatics: Denies new lymphadenopathy or easy bruising Neurological: (+) neuropathy in fingertips and feet Behavioral/Psych: Mood is stable, no new changes  All other systems were reviewed with the patient and are negative.  PHYSICAL EXAMINATION: ECOG PERFORMANCE STATUS: 1 BP (!) 128/54 (BP Location: Left Arm, Patient Position: Sitting) Comment: RN Myrtle aware of bp  Pulse 68   Temp 98 F (36.7 C) (Oral)   Resp 17   Ht _0  (1.702 m)   Wt 190 lb 12.8 oz (86.5 kg)   SpO2 98%   BMI 29.88 kg/m   GENERAL:alert, no distress and comfortable  SKIN: skin color, texture, turgor are normal, no rashes or significant lesions EYES: normal, conjunctiva are pink and non-injected, sclera clear OROPHARYNX:no exudate, no erythema and lips, buccal mucosa, and tongue normal  NECK: supple, thyroid normal size, non-tender, without nodularity LYMPH:  no palpable lymphadenopathy in the cervical, axillary or inguinal LUNGS: clear to auscultation and percussion with normal breathing effort HEART: regular rate & rhythm and no murmurs and no lower extremity edema ABDOMEN:abdomen soft, non-tender and normal bowel sounds, midline incision has healed well, no discharge or skin erythema.  Musculoskeletal:no cyanosis of digits and no clubbing. (+) Bilateral ankle edema. PSYCH: alert & oriented x 3 with fluent speech NEURO: no focal motor/sensory deficits  LABORATORY DATA:  I have reviewed the data as listed CBC Latest Ref Rng & Units 12/29/2016 09/14/2016 08/10/2016  WBC 3.9 - 10.3 10e3/uL 4.4 5.4 4.3  Hemoglobin 11.6 - 15.9 g/dL 14.2 13.3 13.0  Hematocrit 34.8 - 46.6 % 42.9 40.7 38.4  Platelets 145 - 400 10e3/uL 139(L) 120(L) 148   CMP Latest Ref Rng & Units 12/29/2016 09/14/2016 08/10/2016   Glucose 70 - 140 mg/dl 102 101 113  BUN 7.0 - 26.0 mg/dL 20.4 13.5 15.9  Creatinine 0.6 - 1.1 mg/dL 0.7 0.7 0.7  Sodium 136 - 145 mEq/L 138 141 139  Potassium 3.5 - 5.1 mEq/L 4.7 4.2 4.2  Chloride 101 - 111 mmol/L - - -  CO2 22 - 29 mEq/L _1 Calcium 8.4 - 10.4 mg/dL 9.5 9.6 9.6  Total Protein 6.4 - 8.3 g/dL 6.7 6.5 6.2(L)  Total Bilirubin 0.20 - 1.20 mg/dL 0.57 0.86  0.72  Alkaline Phos 40 - 150 U/L 176(H) 152(H) 186(H)  AST 5 - 34 U/L _0 ALT 0 - 55 U/L _1 Results for SONJI, STARKES (MRN [id number]) as of 09/21/2016 13:49  Ref. Range 06/29/2016 10:28 07/18/2016 07:36 09/14/2016 12:31  CEA ([facility]) Latest Ref Range: 0.00 - 5.00 ng/mL 2.69 2.52 2.82     PATHOLOGY REPORT  Diagnosis 01/04/2016 Colon, biopsy, rectosigmoid junction - ADENOCARCINOMA, SEE COMMENT.  Diagnosis 01/28/2016 1. Soft tissue, biopsy, Implant on sigmoid colon - BENIGN CALCIFIED NODULE. - NO EVIDENCE OF MALIGNANCY. 2. Colon, segmental resection for tumor, Recto-sigmoid - INVASIVE COLONIC ADENOCARCINOMA, 4.5 CM EXTENDING INTO PERICOLONIC CONNECTIVE TISSUE. - LYMPHATIC VASCULAR INVOLVEMENT BY TUMOR. - MARGINS NOT INVOLVED. - METASTATIC CARCINOMA IN 9 OF 21 LYMPH NODES (9/21). - SEE ONCOLOGY TABLE BELOW. 3. Colon, resection margin (donut), Proximal and distal of sigmoid - BENIGN PROXIMAL AND DISTAL ANASTOMATIC RINGS. - NO EVIDENCE OF MALIGNANCY. Specimen Gross and Clinical Information  RADIOGRAPHIC STUDIES: I have personally reviewed the radiological images as listed and agreed with the findings in the report. No results found.   CT abdomen and pelvis with contrast 01/05/2016 IMPRESSION: 1. No dominant sigmoid colon mass identified. There is underdistention and soft tissue fullness at the rectosigmoid junction, suspicious for the primary lesion. Borderline enlarged nodes in the sigmoid mesocolon and at the origin of the inferior mesenteric artery are indeterminate. As these are in  the drainage pathway for a sigmoid primary, localized nodal metastasis cannot be excluded. 2. No evidence of distant metastatic disease. 3.  Aortic atherosclerosis. 4. Uterine fibroid. 5. Left hepatic lobe giant hemangioma, as before.  CT chest 02/24/2016 IMPRESSION: 1. No evidence of pulmonary metastasis. 2. Stable large hepatic hemangioma.  Colonoscopy 01/04/2016 Dr. Watt Climes Findings: An ulcerated partially obstructing large mass was found in the rectosigmoid colon. The mass was partially circumferential, measured about 5 cm in length. Biopsy was taken. Multiple small milestone diverticula were found in the sigmoid colon. The exam otherwise was normal.  CT CAP w contrast 09/14/2016 IMPRESSION: No evidence of locally recurrent or metastatic carcinoma.  Small periumbilical hernia containing small bowel.  Stable 5 cm benign hemangioma in left hepatic lobe.  Stable small calcified uterine fibroid.  Colonic diverticulosis. No radiographic evidence of diverticulitis.   ASSESSMENT & PLAN:  66 y.o. Caucasian female, with recently diagnosed colorectal cancer   1. Colorectal cancer, invasive adenocarcinoma,pT3N2bM0, stage IIIC, MSI-stable  -Her primary cancer is located in the rectosigmoid colon. -I previously reviewed her staging scan findings with patient and her husband. -She has had complete surgical resection -Giving the multiple positive lymph nodes, and locally advanced disease, she is at high risk for recurrence. -she received adjuvant chemotherapy CAPOX for 8 cycles, oxaliplatin was held after cycle 4 due to poor tolerance. -She has recovered well from adjuvant chemotherapy, with mild residual neuropathy which is improving. -I previously reviewed her surveillance CT scan from 09/14/2016, which showed no evidence of recurrence -She is clinically doing very well, lab reviewed, physical exam today was unremarkable, no clinical concern for recurrence. Continue colon cancer  surveillance. I'll plan to see her back in 4 months with lab and surveillance CT chest, abdomen and pelvis a few days ago. - Port flush in 6 weeks. She plans to remove her port during her hernia surgery in a few months.  2. 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 This is considered a benign breast lesion, not high risk for breast cancer. -Continue annual screening mammogram, self exam and routine follow-up.  3. 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  Plan -Lab reviewed, she is doing well, no clinical concern for recurrence -Return for follow-up in 4 months, with lab and CT chest, abdomen and pelvis with contrast a few days before -Port flush in 6 weeks, she plans to remove her port during her hernia repair surgery in a few months.  All questions were answered. The patient knows to call the clinic with any problems, questions or concerns.  I spent 20 minutes counseling the patient face to face. The total time spent in the appointment was 25 minutes and more than 50% was on counseling.   Truitt Merle  12/29/2016   .

## 2016-12-29 NOTE — Telephone Encounter (Signed)
Gave patient avs and calendar with upcoming appts.  °

## 2016-12-30 ENCOUNTER — Encounter: Payer: Self-pay | Admitting: Hematology

## 2017-01-04 ENCOUNTER — Telehealth: Payer: Self-pay | Admitting: *Deleted

## 2017-01-04 NOTE — Telephone Encounter (Signed)
TCT patient. Spoke with her and informed her that her recent CEA was normal. Pt verbalized understanding. No questions or concerns voiced at this time.

## 2017-01-11 DIAGNOSIS — Z1231 Encounter for screening mammogram for malignant neoplasm of breast: Secondary | ICD-10-CM | POA: Diagnosis not present

## 2017-01-11 DIAGNOSIS — Z01419 Encounter for gynecological examination (general) (routine) without abnormal findings: Secondary | ICD-10-CM | POA: Diagnosis not present

## 2017-01-11 DIAGNOSIS — Z6830 Body mass index (BMI) 30.0-30.9, adult: Secondary | ICD-10-CM | POA: Diagnosis not present

## 2017-01-12 ENCOUNTER — Ambulatory Visit (HOSPITAL_BASED_OUTPATIENT_CLINIC_OR_DEPARTMENT_OTHER): Payer: Federal, State, Local not specified - PPO

## 2017-01-12 VITALS — BP 140/70 | HR 53 | Temp 97.8°F | Resp 18

## 2017-01-12 DIAGNOSIS — C19 Malignant neoplasm of rectosigmoid junction: Secondary | ICD-10-CM | POA: Diagnosis not present

## 2017-01-12 DIAGNOSIS — Z452 Encounter for adjustment and management of vascular access device: Secondary | ICD-10-CM | POA: Diagnosis not present

## 2017-01-12 DIAGNOSIS — C186 Malignant neoplasm of descending colon: Secondary | ICD-10-CM

## 2017-01-12 DIAGNOSIS — Z95828 Presence of other vascular implants and grafts: Secondary | ICD-10-CM

## 2017-01-12 MED ORDER — SODIUM CHLORIDE 0.9% FLUSH
10.0000 mL | INTRAVENOUS | Status: DC | PRN
  Administered 2017-01-12: 10 mL via INTRAVENOUS
  Filled 2017-01-12: qty 10

## 2017-01-12 MED ORDER — HEPARIN SOD (PORK) LOCK FLUSH 100 UNIT/ML IV SOLN
500.0000 [IU] | Freq: Once | INTRAVENOUS | Status: AC | PRN
  Administered 2017-01-12: 500 [IU] via INTRAVENOUS
  Filled 2017-01-12: qty 5

## 2017-01-12 NOTE — Patient Instructions (Signed)
Implanted Port Home Guide An implanted port is a type of central line that is placed under the skin. Central lines are used to provide IV access when treatment or nutrition needs to be given through a person's veins. Implanted ports are used for long-term IV access. An implanted port may be placed because:  You need IV medicine that would be irritating to the small veins in your hands or arms.  You need long-term IV medicines, such as antibiotics.  You need IV nutrition for a long period.  You need frequent blood draws for lab tests.  You need dialysis.  Implanted ports are usually placed in the chest area, but they can also be placed in the upper arm, the abdomen, or the leg. An implanted port has two main parts:  Reservoir. The reservoir is round and will appear as a small, raised area under your skin. The reservoir is the part where a needle is inserted to give medicines or draw blood.  Catheter. The catheter is a thin, flexible tube that extends from the reservoir. The catheter is placed into a large vein. Medicine that is inserted into the reservoir goes into the catheter and then into the vein.  How will I care for my incision site? Do not get the incision site wet. Bathe or shower as directed by your health care provider. How is my port accessed? Special steps must be taken to access the port:  Before the port is accessed, a numbing cream can be placed on the skin. This helps numb the skin over the port site.  Your health care provider uses a sterile technique to access the port. ? Your health care provider must put on a mask and sterile gloves. ? The skin over your port is cleaned carefully with an antiseptic and allowed to dry. ? The port is gently pinched between sterile gloves, and a needle is inserted into the port.  Only "non-coring" port needles should be used to access the port. Once the port is accessed, a blood return should be checked. This helps ensure that the port  is in the vein and is not clogged.  If your port needs to remain accessed for a constant infusion, a clear (transparent) bandage will be placed over the needle site. The bandage and needle will need to be changed every week, or as directed by your health care provider.  Keep the bandage covering the needle clean and dry. Do not get it wet. Follow your health care provider's instructions on how to take a shower or bath while the port is accessed.  If your port does not need to stay accessed, no bandage is needed over the port.  What is flushing? Flushing helps keep the port from getting clogged. Follow your health care provider's instructions on how and when to flush the port. Ports are usually flushed with saline solution or a medicine called heparin. The need for flushing will depend on how the port is used.  If the port is used for intermittent medicines or blood draws, the port will need to be flushed: ? After medicines have been given. ? After blood has been drawn. ? As part of routine maintenance.  If a constant infusion is running, the port may not need to be flushed.  How long will my port stay implanted? The port can stay in for as long as your health care provider thinks it is needed. When it is time for the port to come out, surgery will be   done to remove it. The procedure is similar to the one performed when the port was put in. When should I seek immediate medical care? When you have an implanted port, you should seek immediate medical care if:  You notice a bad smell coming from the incision site.  You have swelling, redness, or drainage at the incision site.  You have more swelling or pain at the port site or the surrounding area.  You have a fever that is not controlled with medicine.  This information is not intended to replace advice given to you by your health care provider. Make sure you discuss any questions you have with your health care provider. Document  Released: 04/25/2005 Document Revised: 10/01/2015 Document Reviewed: 12/31/2012 Elsevier Interactive Patient Education  2017 [employer]  

## 2017-02-03 DIAGNOSIS — H2513 Age-related nuclear cataract, bilateral: Secondary | ICD-10-CM | POA: Diagnosis not present

## 2017-02-03 DIAGNOSIS — H04123 Dry eye syndrome of bilateral lacrimal glands: Secondary | ICD-10-CM | POA: Diagnosis not present

## 2017-02-03 DIAGNOSIS — H40023 Open angle with borderline findings, high risk, bilateral: Secondary | ICD-10-CM | POA: Diagnosis not present

## 2017-02-09 ENCOUNTER — Ambulatory Visit: Payer: Self-pay | Admitting: General Surgery

## 2017-02-09 DIAGNOSIS — C187 Malignant neoplasm of sigmoid colon: Secondary | ICD-10-CM | POA: Diagnosis not present

## 2017-02-23 ENCOUNTER — Ambulatory Visit (HOSPITAL_BASED_OUTPATIENT_CLINIC_OR_DEPARTMENT_OTHER): Payer: Federal, State, Local not specified - PPO

## 2017-02-23 DIAGNOSIS — C186 Malignant neoplasm of descending colon: Secondary | ICD-10-CM

## 2017-02-23 DIAGNOSIS — C19 Malignant neoplasm of rectosigmoid junction: Secondary | ICD-10-CM

## 2017-02-23 DIAGNOSIS — Z452 Encounter for adjustment and management of vascular access device: Secondary | ICD-10-CM

## 2017-02-23 DIAGNOSIS — Z95828 Presence of other vascular implants and grafts: Secondary | ICD-10-CM

## 2017-02-23 MED ORDER — HEPARIN SOD (PORK) LOCK FLUSH 100 UNIT/ML IV SOLN
500.0000 [IU] | Freq: Once | INTRAVENOUS | Status: AC | PRN
  Administered 2017-02-23: 500 [IU] via INTRAVENOUS
  Filled 2017-02-23: qty 5

## 2017-02-23 MED ORDER — SODIUM CHLORIDE 0.9% FLUSH
10.0000 mL | INTRAVENOUS | Status: DC | PRN
  Administered 2017-02-23: 10 mL via INTRAVENOUS
  Filled 2017-02-23: qty 10

## 2017-03-06 NOTE — Pre-Procedure Instructions (Signed)
JOLEA DOLLE  03/06/2017      [place], [place] - [id number] [place] [address] Phone: [phone] Fax: [phone]  [id number] - [place], [place] [address] Phone: [phone] Fax: [phone]  [place], [place] [address] Phone: [phone] Fax: [phone]  [place], [place] [address] [place] [address] Phone: [phone] Fax: [phone]    Your procedure is scheduled on November 1  Report to [facility] at [place].M.  Call this number if you have problems the morning of surgery:  [phone]   Remember:  Do not eat food or drink liquids after midnight.  Continue all other medications as directed by your physician except follow these medication instructions before surgery  Please complete your 8oz of Boost Breeze or Water that was given to you at your preadmission appointment by 0530am on the DAY OF YOUR SURGERY   Take these medicines the morning of surgery with A SIP OF WATER  famotidine (PEPCID) Eye drops if needed  7 days prior to surgery STOP taking any Aspirin (unless otherwise instructed by your surgeon), Aleve, Naproxen, Ibuprofen, Motrin, Advil, Goody's, BC's, all herbal medications, fish oil, and all vitamins   Do not wear jewelry, make-up or nail polish.  Do not wear lotions, powders, or perfumes, or deoderant.  Do not shave 48 hours prior to surgery.  Men may shave face and neck.  Do not bring valuables to the hospital.  [facility] is not responsible for any belongings or valuables.  Contacts, dentures or bridgework may not be worn into surgery.  Leave your suitcase in the car.  After surgery it may be brought to  your room.  For patients admitted to the hospital, discharge time will be determined by your treatment team.  Patients discharged the day of surgery will not be allowed to drive home.   Special instructions:   [facility]- Preparing For Surgery  Before surgery, you can play an important role. Because skin is not sterile, your skin needs to be as free of germs as possible. You can reduce the number of germs on your skin by washing with CHG (chlorahexidine gluconate) Soap before surgery.  CHG is an antiseptic cleaner which kills germs and bonds with the skin to continue killing germs even after washing.  Please do not use if you have an allergy to CHG or antibacterial soaps. If your skin becomes reddened/irritated stop using the CHG.  Do not shave (including legs and underarms) for at least 48 hours prior to first CHG shower. It is OK to shave your face.  Please follow these instructions carefully.   1. Shower the NIGHT BEFORE SURGERY and the MORNING OF SURGERY with CHG.   2. If you chose to wash your hair, wash your hair first as usual with your normal shampoo.  3. After you shampoo, rinse your hair and body thoroughly to remove the shampoo.  4. Use CHG as you would any other liquid soap. You can apply CHG directly to the skin and wash gently with a scrungie or a clean washcloth.   5. Apply the CHG Soap to your body ONLY FROM THE NECK DOWN.  Do not use  on open wounds or open sores. Avoid contact with your eyes, ears, mouth and genitals (private parts). Wash Face and genitals (private parts)  with your normal soap.  6. Wash thoroughly, paying special attention to the area where your surgery will be performed.  7. Thoroughly rinse your body with warm water from the neck down.  8. DO NOT shower/wash with your normal soap after using and rinsing off the CHG Soap.  9. Pat yourself dry with a CLEAN TOWEL.  10. Wear CLEAN PAJAMAS to bed the night before surgery, wear comfortable clothes the  morning of surgery  11. Place CLEAN SHEETS on your bed the night of your first shower and DO NOT SLEEP WITH PETS.    Day of Surgery: Do not apply any deodorants/lotions. Please wear clean clothes to the hospital/surgery center.      Please read over the following fact sheets that you were given.

## 2017-03-07 ENCOUNTER — Encounter (HOSPITAL_COMMUNITY): Payer: Self-pay

## 2017-03-07 ENCOUNTER — Encounter (HOSPITAL_COMMUNITY)
Admission: RE | Admit: 2017-03-07 | Discharge: 2017-03-07 | Disposition: A | Discharge status: Home / Self Care | Payer: Federal, State, Local not specified - PPO | Source: Ambulatory Visit | Attending: General Surgery | Admitting: General Surgery

## 2017-03-07 DIAGNOSIS — Z9049 Acquired absence of other specified parts of digestive tract: Secondary | ICD-10-CM | POA: Diagnosis not present

## 2017-03-07 DIAGNOSIS — Z452 Encounter for adjustment and management of vascular access device: Secondary | ICD-10-CM | POA: Diagnosis not present

## 2017-03-07 DIAGNOSIS — Z9221 Personal history of antineoplastic chemotherapy: Secondary | ICD-10-CM | POA: Diagnosis not present

## 2017-03-07 DIAGNOSIS — Z79899 Other long term (current) drug therapy: Secondary | ICD-10-CM | POA: Diagnosis not present

## 2017-03-07 DIAGNOSIS — Z85048 Personal history of other malignant neoplasm of rectum, rectosigmoid junction, and anus: Secondary | ICD-10-CM | POA: Diagnosis not present

## 2017-03-07 DIAGNOSIS — K439 Ventral hernia without obstruction or gangrene: Secondary | ICD-10-CM | POA: Diagnosis not present

## 2017-03-07 LAB — BASIC METABOLIC PANEL
Anion gap: 7 (ref 5–15)
BUN: 14 mg/dL (ref 6–20)
CALCIUM: 9.3 mg/dL (ref 8.9–10.3)
CO2: 27 mmol/L (ref 22–32)
CREATININE: 0.6 mg/dL (ref 0.44–1.00)
Chloride: 107 mmol/L (ref 101–111)
GFR calc non Af Amer: >60 mL/min (ref >60)
Glucose, Bld: 97 mg/dL (ref 65–99)
Potassium: 4.4 mmol/L (ref 3.5–5.1)
Sodium: 141 mmol/L (ref 135–145)

## 2017-03-07 LAB — CBC
HCT: 42 % (ref 36.0–46.0)
Hemoglobin: 13.5 g/dL (ref 12.0–15.0)
MCH: 28.7 pg (ref 26.0–34.0)
MCHC: 32.1 g/dL (ref 30.0–36.0)
MCV: 89.4 fL (ref 78.0–100.0)
Platelets: 169 10*3/uL (ref 150–400)
RBC: 4.7 MIL/uL (ref 3.87–5.11)
RDW: 14.7 % (ref 11.5–15.5)
WBC: 5.6 10*3/uL (ref 4.0–10.5)

## 2017-03-07 NOTE — Progress Notes (Signed)
Patient thinks that her port is to be removed as well.  Dr. Ethlyn Gallery office called for clarification

## 2017-03-07 NOTE — Progress Notes (Signed)
PCP - Horald Pollen Cardiologist - denies  Chest x-ray - not needed EKG - 03/07/17 Stress Test - denies ECHO - denies Cardiac Cath - debnies     Patient denies shortness of breath, fever, cough and chest pain at PAT appointment   Patient verbalized understanding of instructions that were given to them at the PAT appointment. Patient was also instructed that they will need to review over the PAT instructions again at home before surgery.

## 2017-03-09 ENCOUNTER — Ambulatory Visit (HOSPITAL_COMMUNITY)
Admission: RE | Admit: 2017-03-09 | Discharge: 2017-03-10 | Disposition: A | Discharge status: Home / Self Care | Payer: Federal, State, Local not specified - PPO | Source: Ambulatory Visit | Attending: General Surgery | Admitting: General Surgery

## 2017-03-09 ENCOUNTER — Encounter (HOSPITAL_COMMUNITY)
Admission: RE | Disposition: A | Discharge status: Home / Self Care | Payer: Self-pay | Source: Ambulatory Visit | Attending: General Surgery

## 2017-03-09 ENCOUNTER — Ambulatory Visit (HOSPITAL_COMMUNITY): Payer: Federal, State, Local not specified - PPO | Admitting: Certified Registered Nurse Anesthetist

## 2017-03-09 ENCOUNTER — Encounter (HOSPITAL_COMMUNITY): Payer: Self-pay | Admitting: *Deleted

## 2017-03-09 DIAGNOSIS — K439 Ventral hernia without obstruction or gangrene: Secondary | ICD-10-CM | POA: Insufficient documentation

## 2017-03-09 DIAGNOSIS — Z85048 Personal history of other malignant neoplasm of rectum, rectosigmoid junction, and anus: Secondary | ICD-10-CM | POA: Insufficient documentation

## 2017-03-09 DIAGNOSIS — Z9049 Acquired absence of other specified parts of digestive tract: Secondary | ICD-10-CM | POA: Insufficient documentation

## 2017-03-09 DIAGNOSIS — Z79899 Other long term (current) drug therapy: Secondary | ICD-10-CM | POA: Diagnosis not present

## 2017-03-09 DIAGNOSIS — Z452 Encounter for adjustment and management of vascular access device: Secondary | ICD-10-CM | POA: Insufficient documentation

## 2017-03-09 DIAGNOSIS — Z9221 Personal history of antineoplastic chemotherapy: Secondary | ICD-10-CM | POA: Insufficient documentation

## 2017-03-09 HISTORY — DX: Malignant neoplasm of sigmoid colon: C18.7

## 2017-03-09 HISTORY — PX: PORT-A-CATH REMOVAL: SHX5289

## 2017-03-09 HISTORY — PX: INSERTION OF MESH: SHX5868

## 2017-03-09 HISTORY — PX: VENTRAL HERNIA REPAIR: SHX424

## 2017-03-09 HISTORY — DX: Personal history of other medical treatment: Z92.89

## 2017-03-09 HISTORY — PX: LAPAROSCOPIC ASSISTED VENTRAL HERNIA REPAIR: SHX6312

## 2017-03-09 SURGERY — REPAIR, HERNIA, VENTRAL
Anesthesia: General | Site: Chest

## 2017-03-09 MED ORDER — 0.9 % SODIUM CHLORIDE (POUR BTL) OPTIME
TOPICAL | Status: DC | PRN
  Administered 2017-03-09: 1000 mL

## 2017-03-09 MED ORDER — BUPIVACAINE-EPINEPHRINE (PF) 0.25% -1:200000 IJ SOLN
INTRAMUSCULAR | Status: AC
  Filled 2017-03-09: qty 30

## 2017-03-09 MED ORDER — SUGAMMADEX SODIUM 200 MG/2ML IV SOLN
INTRAVENOUS | Status: DC | PRN
  Administered 2017-03-09: 200 mg via INTRAVENOUS

## 2017-03-09 MED ORDER — PHENYLEPHRINE 40 MCG/ML (10ML) SYRINGE FOR IV PUSH (FOR BLOOD PRESSURE SUPPORT)
PREFILLED_SYRINGE | INTRAVENOUS | Status: AC
  Filled 2017-03-09: qty 10

## 2017-03-09 MED ORDER — ONDANSETRON 4 MG PO TBDP: 4.0000 mg | ORAL_TABLET | Freq: Four times a day (QID) | ORAL | Status: DC | PRN

## 2017-03-09 MED ORDER — BUPIVACAINE-EPINEPHRINE 0.25% -1:200000 IJ SOLN
INTRAMUSCULAR | Status: DC | PRN
  Administered 2017-03-09: 20 mL

## 2017-03-09 MED ORDER — SUGAMMADEX SODIUM 200 MG/2ML IV SOLN
INTRAVENOUS | Status: AC
  Filled 2017-03-09: qty 2

## 2017-03-09 MED ORDER — FENTANYL CITRATE (PF) 250 MCG/5ML IJ SOLN
INTRAMUSCULAR | Status: AC
  Filled 2017-03-09: qty 5

## 2017-03-09 MED ORDER — METHOCARBAMOL 500 MG PO TABS
500.0000 mg | ORAL_TABLET | Freq: Four times a day (QID) | ORAL | Status: DC | PRN
  Administered 2017-03-09: 500 mg via ORAL
  Filled 2017-03-09: qty 1

## 2017-03-09 MED ORDER — STERILE WATER FOR IRRIGATION IR SOLN
Status: DC | PRN
  Administered 2017-03-09: 1000 mL

## 2017-03-09 MED ORDER — MIDAZOLAM HCL 5 MG/5ML IJ SOLN
INTRAMUSCULAR | Status: DC | PRN
  Administered 2017-03-09: 2 mg via INTRAVENOUS

## 2017-03-09 MED ORDER — PROPOFOL 10 MG/ML IV BOLUS
INTRAVENOUS | Status: AC
  Filled 2017-03-09: qty 20

## 2017-03-09 MED ORDER — ROCURONIUM BROMIDE 10 MG/ML (PF) SYRINGE
PREFILLED_SYRINGE | INTRAVENOUS | Status: AC
  Filled 2017-03-09: qty 5

## 2017-03-09 MED ORDER — GABAPENTIN 300 MG PO CAPS
300.0000 mg | ORAL_CAPSULE | ORAL | Status: AC
  Administered 2017-03-09: 300 mg via ORAL
  Filled 2017-03-09: qty 1

## 2017-03-09 MED ORDER — ONDANSETRON HCL 4 MG/2ML IJ SOLN
INTRAMUSCULAR | Status: DC | PRN
  Administered 2017-03-09: 4 mg via INTRAVENOUS

## 2017-03-09 MED ORDER — PHENYLEPHRINE HCL 10 MG/ML IJ SOLN
INTRAMUSCULAR | Status: DC | PRN
  Administered 2017-03-09: 80 ug via INTRAVENOUS
  Administered 2017-03-09: 40 ug via INTRAVENOUS
  Administered 2017-03-09: 80 ug via INTRAVENOUS
  Administered 2017-03-09: 120 ug via INTRAVENOUS

## 2017-03-09 MED ORDER — ONDANSETRON HCL 4 MG/2ML IJ SOLN: 4.0000 mg | Freq: Four times a day (QID) | INTRAMUSCULAR | Status: DC | PRN

## 2017-03-09 MED ORDER — LIDOCAINE 2% (20 MG/ML) 5 ML SYRINGE
INTRAMUSCULAR | Status: AC
  Filled 2017-03-09: qty 5

## 2017-03-09 MED ORDER — LIDOCAINE HCL (CARDIAC) 20 MG/ML IV SOLN
INTRAVENOUS | Status: DC | PRN
  Administered 2017-03-09: 20 mg via INTRAVENOUS
  Administered 2017-03-09: 20 mg via INTRATRACHEAL

## 2017-03-09 MED ORDER — OXYCODONE HCL 5 MG/5ML PO SOLN: 5.0000 mg | Freq: Once | ORAL | Status: DC | PRN

## 2017-03-09 MED ORDER — PROPOFOL 10 MG/ML IV BOLUS
INTRAVENOUS | Status: DC | PRN
  Administered 2017-03-09: 90 mg via INTRAVENOUS
  Administered 2017-03-09: 20 mg via INTRAVENOUS

## 2017-03-09 MED ORDER — FENTANYL CITRATE (PF) 100 MCG/2ML IJ SOLN
INTRAMUSCULAR | Status: DC | PRN
  Administered 2017-03-09 (×2): 50 ug via INTRAVENOUS
  Administered 2017-03-09: 100 ug via INTRAVENOUS
  Administered 2017-03-09: 50 ug via INTRAVENOUS

## 2017-03-09 MED ORDER — PANTOPRAZOLE SODIUM 40 MG IV SOLR
40.0000 mg | Freq: Every day | INTRAVENOUS | Status: DC
Start: 2017-03-09 — End: 2017-03-10
  Administered 2017-03-09: 40 mg via INTRAVENOUS
  Filled 2017-03-09: qty 40

## 2017-03-09 MED ORDER — CHLORHEXIDINE GLUCONATE CLOTH 2 % EX PADS: 6.0000 | MEDICATED_PAD | Freq: Once | CUTANEOUS | Status: DC

## 2017-03-09 MED ORDER — ONDANSETRON HCL 4 MG/2ML IJ SOLN: 4.0000 mg | Freq: Once | INTRAMUSCULAR | Status: DC | PRN

## 2017-03-09 MED ORDER — HYDROMORPHONE HCL 1 MG/ML IJ SOLN
INTRAMUSCULAR | Status: AC
  Administered 2017-03-09: 0.5 mg via INTRAVENOUS
  Filled 2017-03-09: qty 1

## 2017-03-09 MED ORDER — EPHEDRINE 5 MG/ML INJ
INTRAVENOUS | Status: AC
  Filled 2017-03-09: qty 10

## 2017-03-09 MED ORDER — OXYCODONE HCL 5 MG PO TABS: 5.0000 mg | ORAL_TABLET | Freq: Once | ORAL | Status: DC | PRN

## 2017-03-09 MED ORDER — SUCCINYLCHOLINE CHLORIDE 200 MG/10ML IV SOSY
PREFILLED_SYRINGE | INTRAVENOUS | Status: AC
  Filled 2017-03-09: qty 10

## 2017-03-09 MED ORDER — CEFAZOLIN SODIUM-DEXTROSE 2-4 GM/100ML-% IV SOLN
2.0000 g | INTRAVENOUS | Status: AC
  Administered 2017-03-09: 2 g via INTRAVENOUS

## 2017-03-09 MED ORDER — ROCURONIUM BROMIDE 100 MG/10ML IV SOLN
INTRAVENOUS | Status: DC | PRN
  Administered 2017-03-09: 50 mg via INTRAVENOUS
  Administered 2017-03-09: 10 mg via INTRAVENOUS

## 2017-03-09 MED ORDER — HYDROCODONE-ACETAMINOPHEN 5-325 MG PO TABS
1.0000 | ORAL_TABLET | ORAL | Status: DC | PRN
  Administered 2017-03-09 – 2017-03-10 (×3): 2 via ORAL
  Administered 2017-03-10: 1 via ORAL
  Filled 2017-03-09 (×2): qty 2
  Filled 2017-03-09: qty 1
  Filled 2017-03-09: qty 2

## 2017-03-09 MED ORDER — DEXAMETHASONE SODIUM PHOSPHATE 10 MG/ML IJ SOLN
INTRAMUSCULAR | Status: AC
  Filled 2017-03-09: qty 1

## 2017-03-09 MED ORDER — EPHEDRINE SULFATE 50 MG/ML IJ SOLN
INTRAMUSCULAR | Status: DC | PRN
Start: 2017-03-09 — End: 2017-03-09
  Administered 2017-03-09 (×2): 5 mg via INTRAVENOUS

## 2017-03-09 MED ORDER — MORPHINE SULFATE (PF) 4 MG/ML IV SOLN: 1.0000 mg | INTRAVENOUS | Status: DC | PRN

## 2017-03-09 MED ORDER — ACETAMINOPHEN 500 MG PO TABS
1000.0000 mg | ORAL_TABLET | ORAL | Status: AC
  Administered 2017-03-09: 1000 mg via ORAL
  Filled 2017-03-09: qty 2

## 2017-03-09 MED ORDER — ONDANSETRON HCL 4 MG/2ML IJ SOLN
INTRAMUSCULAR | Status: AC
  Filled 2017-03-09: qty 2

## 2017-03-09 MED ORDER — LACTATED RINGERS IV SOLN
INTRAVENOUS | Status: DC
  Administered 2017-03-09 (×2): via INTRAVENOUS

## 2017-03-09 MED ORDER — CELECOXIB 200 MG PO CAPS
200.0000 mg | ORAL_CAPSULE | ORAL | Status: AC
  Administered 2017-03-09: 200 mg via ORAL
  Filled 2017-03-09: qty 1

## 2017-03-09 MED ORDER — HYDROMORPHONE HCL 1 MG/ML IJ SOLN
0.2500 mg | INTRAMUSCULAR | Status: DC | PRN
  Administered 2017-03-09 (×2): 0.5 mg via INTRAVENOUS

## 2017-03-09 MED ORDER — DEXAMETHASONE SODIUM PHOSPHATE 10 MG/ML IJ SOLN
INTRAMUSCULAR | Status: DC | PRN
  Administered 2017-03-09: 10 mg via INTRAVENOUS

## 2017-03-09 MED ORDER — HEPARIN SODIUM (PORCINE) 5000 UNIT/ML IJ SOLN
5000.0000 [IU] | Freq: Three times a day (TID) | INTRAMUSCULAR | Status: DC
  Administered 2017-03-10: 5000 [IU] via SUBCUTANEOUS
  Filled 2017-03-09: qty 1

## 2017-03-09 SURGICAL SUPPLY — 59 items
ADH SKN CLS APL DERMABOND .7 (GAUZE/BANDAGES/DRESSINGS) ×4
BINDER ABDOMINAL 10 UNV 27-48 (MISCELLANEOUS) ×2 IMPLANT
BLADE CLIPPER SURG (BLADE) IMPLANT
CANISTER SUCT 3000ML PPV (MISCELLANEOUS) ×2 IMPLANT
CHLORAPREP W/TINT 10.5 ML (MISCELLANEOUS) ×4 IMPLANT
CHLORAPREP W/TINT 26ML (MISCELLANEOUS) ×4 IMPLANT
COVER SURGICAL LIGHT HANDLE (MISCELLANEOUS) ×4 IMPLANT
DERMABOND ADVANCED (GAUZE/BANDAGES/DRESSINGS) ×4
DERMABOND ADVANCED .7 DNX12 (GAUZE/BANDAGES/DRESSINGS) ×2 IMPLANT
DEVICE SECURE STRAP 25 ABSORB (INSTRUMENTS) ×2 IMPLANT
DEVICE TROCAR PUNCTURE CLOSURE (ENDOMECHANICALS) ×2 IMPLANT
DRAPE LAPAROSCOPIC ABDOMINAL (DRAPES) ×2 IMPLANT
DRAPE LAPAROTOMY 100X72 PEDS (DRAPES) ×2 IMPLANT
DRAPE UTILITY XL STRL (DRAPES) ×4 IMPLANT
DRSG PAD ABDOMINAL 8X10 ST (GAUZE/BANDAGES/DRESSINGS) ×4 IMPLANT
ELECT CAUTERY BLADE 6.4 (BLADE) ×4 IMPLANT
ELECT REM PT RETURN 9FT ADLT (ELECTROSURGICAL) ×4
ELECTRODE REM PT RTRN 9FT ADLT (ELECTROSURGICAL) ×2 IMPLANT
GAUZE SPONGE 4X4 12PLY STRL (GAUZE/BANDAGES/DRESSINGS) ×6 IMPLANT
GAUZE SPONGE 4X4 16PLY XRAY LF (GAUZE/BANDAGES/DRESSINGS) ×2 IMPLANT
GLOVE BIO SURGEON STRL SZ7.5 (GLOVE) ×7 IMPLANT
GOWN STRL REUS W/ TWL LRG LVL3 (GOWN DISPOSABLE) ×8 IMPLANT
GOWN STRL REUS W/TWL LRG LVL3 (GOWN DISPOSABLE) ×16
KIT BASIN OR (CUSTOM PROCEDURE TRAY) ×4 IMPLANT
KIT ROOM TURNOVER OR (KITS) ×4 IMPLANT
MESH VENTRALIGHT ST 6X8 (Mesh Specialty) ×4 IMPLANT
MESH VENTRLGHT ELLIPSE 8X6XMFL (Mesh Specialty) IMPLANT
NDL HYPO 25GX1X1/2 BEV (NEEDLE) ×2 IMPLANT
NDL SPNL 22GX3.5 QUINCKE BK (NEEDLE) IMPLANT
NEEDLE HYPO 25GX1X1/2 BEV (NEEDLE) ×4 IMPLANT
NEEDLE SPNL 22GX3.5 QUINCKE BK (NEEDLE) ×4 IMPLANT
NS IRRIG 1000ML POUR BTL (IV SOLUTION) ×4 IMPLANT
PACK GENERAL/GYN (CUSTOM PROCEDURE TRAY) ×2 IMPLANT
PACK SURGICAL SETUP 50X90 (CUSTOM PROCEDURE TRAY) ×1 IMPLANT
PAD ARMBOARD 7.5X6 YLW CONV (MISCELLANEOUS) ×8 IMPLANT
PENCIL BUTTON HOLSTER BLD 10FT (ELECTRODE) ×6 IMPLANT
SCALPEL HARMONIC ACE (MISCELLANEOUS) ×3 IMPLANT
SCISSORS LAP 5X35 DISP (ENDOMECHANICALS) ×2 IMPLANT
SLEEVE ENDOPATH XCEL 5M (ENDOMECHANICALS) ×4 IMPLANT
SPONGE GAUZE 4X4 12PLY STER LF (GAUZE/BANDAGES/DRESSINGS) IMPLANT
STAPLER VISISTAT 35W (STAPLE) IMPLANT
SUT MNCRL AB 4-0 PS2 18 (SUTURE) ×8 IMPLANT
SUT NOVA NAB DX-16 0-1 5-0 T12 (SUTURE) IMPLANT
SUT NOVA NAB GS-21 1 T12 (SUTURE) ×12 IMPLANT
SUT PROLENE 1 CT (SUTURE) IMPLANT
SUT SILK 2 0 (SUTURE)
SUT SILK 2-0 18XBRD TIE 12 (SUTURE) ×2 IMPLANT
SUT VIC AB 3-0 54X BRD REEL (SUTURE) IMPLANT
SUT VIC AB 3-0 BRD 54 (SUTURE)
SUT VIC AB 3-0 SH 27 (SUTURE) ×8
SUT VIC AB 3-0 SH 27X BRD (SUTURE) ×2 IMPLANT
SUT VIC AB 3-0 SH 27XBRD (SUTURE) IMPLANT
SYR CONTROL 10ML LL (SYRINGE) ×4 IMPLANT
TOWEL OR 17X24 6PK STRL BLUE (TOWEL DISPOSABLE) ×4 IMPLANT
TOWEL OR 17X26 10 PK STRL BLUE (TOWEL DISPOSABLE) ×4 IMPLANT
TRAY FOLEY CATH 16FR SILVER (SET/KITS/TRAYS/PACK) ×2 IMPLANT
TRAY LAPAROSCOPIC MC (CUSTOM PROCEDURE TRAY) ×3 IMPLANT
TROCAR BLADELESS 5MM (ENDOMECHANICALS) ×2 IMPLANT
TUBING INSUFFLATION (TUBING) ×3 IMPLANT

## 2017-03-09 NOTE — Interval H&P Note (Signed)
History and Physical Interval Note:  03/09/2017 9:42 AM  Bethany Frederick  has presented today for surgery, with the diagnosis of VENTRAL HERNIA   The various methods of treatment have been discussed with the patient and family. After consideration of risks, benefits and other options for treatment, the patient has consented to  Procedure(s): LAPAROSCOPIC VENTRAL HERNIA REPAIR WITH MESH (N/A) INSERTION OF MESH (N/A) REMOVAL PORT-A-CATH (N/A) as a surgical intervention .  The patient's history has been reviewed, patient examined, no change in status, stable for surgery.  I have reviewed the patient's chart and labs.  Questions were answered to the patient's satisfaction.     TOTH III,PAUL S

## 2017-03-09 NOTE — Anesthesia Preprocedure Evaluation (Signed)
Anesthesia Evaluation  Patient identified by MRN, date of birth, ID band Patient awake    Reviewed: Allergy & Precautions, NPO status , Patient's Chart, lab work & pertinent test results  Airway Mallampati: II  TM Distance: >3 FB Neck ROM: Full    Dental  (+) Teeth Intact,    Pulmonary    breath sounds clear to auscultation       Cardiovascular  Rhythm:Regular     Neuro/Psych    GI/Hepatic   Endo/Other    Renal/GU      Musculoskeletal   Abdominal   Peds  Hematology   Anesthesia Other Findings   Reproductive/Obstetrics                             Anesthesia Physical Anesthesia Plan  ASA: II  Anesthesia Plan: General   Post-op Pain Management:    Induction: Intravenous  PONV Risk Score and Plan: 1 and Ondansetron and Dexamethasone  Airway Management Planned: Oral ETT  Additional Equipment:   Intra-op Plan:   Post-operative Plan: Extubation in OR  Informed Consent: I have reviewed the patients History and Physical, chart, labs and discussed the procedure including the risks, benefits and alternatives for the proposed anesthesia with the patient or authorized representative who has indicated his/her understanding and acceptance.   Dental advisory given  Plan Discussed with: CRNA and Anesthesiologist  Anesthesia Plan Comments:         Anesthesia Quick Evaluation

## 2017-03-09 NOTE — H&P (Signed)
Bethany Frederick  Location: [facility] Patient #: [id number] DOB: [birth year] Married / Language: English / Race: White Female   History of Present Illness The patient is a 66 year old female who presents for a follow-up for Abdominal pain. 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 She finished her chemotherapy in April. She had an colonoscopy in August by Dr. Watt Climes that by her report was clean. Her only complaint is of some soreness at her umbilicus with some bulging. She denies any nausea or vomiting. Her appetite is good and her bowels are moving regularly. She has been maintaining her weight. Her follow-up scans in May showed no evidence of recurrent disease   Allergies No Known Drug Allergies  Allergies Reconciled   Medication History  Prochlorperazine Maleate (10MG  Tablet, Oral) Active. Ondansetron HCl (8MG  Tablet, Oral) Active. Lidocaine-Prilocaine (2.5-2.5% Cream, External) Active. Capecitabine (500MG  Tablet, Oral) Active. Pepcid (20MG  Tablet, Oral) Active. Clobetasol Propionate (0.05% Cream, External) Active. Hydrocodone-Acetaminophen (5-325MG  Tablet, Oral as needed) Active. Advil (100MG  Tablet Chewable, Oral) Active. Tylenol (325MG  Tablet, Oral) Active. Medications Reconciled    Review of Systems  General Not Present- Appetite Loss, Chills, Fatigue, Fever, Night Sweats, Weight Gain and Weight Loss. Skin Not Present- Change in Wart/Mole, Dryness, Hives, Jaundice, New Lesions, Non-Healing Wounds, Rash and Ulcer. HEENT Not Present- Earache, Hearing Loss, Hoarseness, Nose Bleed, Oral Ulcers, Ringing in the Ears, Seasonal Allergies, Sinus Pain, Sore Throat, Visual Disturbances, Wears glasses/contact lenses and Yellow Eyes. Respiratory Not Present- Bloody sputum, Chronic Cough, Difficulty Breathing, Snoring and Wheezing. Breast Not Present- Breast Mass, Breast Pain,  Nipple Discharge and Skin Changes. Cardiovascular Not Present- Chest Pain, Difficulty Breathing Lying Down, Leg Cramps, Palpitations, Rapid Heart Rate, Shortness of Breath and Swelling of Extremities. Gastrointestinal Present- Bloody Stool. Not Present- Abdominal Pain, Bloating, Change in Bowel Habits, Chronic diarrhea, Constipation, Difficulty Swallowing, Excessive gas, Gets full quickly at meals, Hemorrhoids, Indigestion, Nausea, Rectal Pain and Vomiting. Female Genitourinary Not Present- Frequency, Nocturia, Painful Urination, Pelvic Pain and Urgency. Musculoskeletal Not Present- Back Pain, Joint Pain, Joint Stiffness, Muscle Pain, Muscle Weakness and Swelling of Extremities. Neurological Not Present- Decreased Memory, Fainting, Headaches, Numbness, Seizures, Tingling, Tremor, Trouble walking and Weakness. Psychiatric Not Present- Anxiety, Bipolar, Change in Sleep Pattern, Depression, Fearful and Frequent crying. Endocrine Not Present- Cold Intolerance, Excessive Hunger, Hair Changes, Heat Intolerance, Hot flashes and New Diabetes. Hematology Not Present- Easy Bruising, Excessive bleeding, Gland problems, HIV and Persistent Infections.  Vitals  Weight: 191.6 lb Height: 67in Body Surface Area: 1.99 m Body Mass Index: 30.01 kg/m  Temp.: 97.28F  Pulse: 77 (Regular)  BP: 128/74 (Sitting, Left Arm, Standard)       Physical Exam  General Mental Status-Alert. General Appearance-Consistent with stated age. Hydration-Well hydrated. Voice-Normal.  Head and Neck Head-normocephalic, atraumatic with no lesions or palpable masses. Trachea-midline. Thyroid Gland Characteristics - normal size and consistency.  Eye Eyeball - Bilateral-Extraocular movements intact. Sclera/Conjunctiva - Bilateral-No scleral icterus.  Chest and Lung Exam Chest and lung exam reveals -quiet, even and easy respiratory effort with no use of accessory muscles and on auscultation,  normal breath sounds, no adventitious sounds and normal vocal resonance. Inspection Chest Wall - Normal. Back - normal.  Cardiovascular Cardiovascular examination reveals -normal heart sounds, regular rate and rhythm with no murmurs and normal pedal pulses bilaterally.  Abdomen Note: The abdomen is soft and nontender. There is no palpable mass. There is a small  fascial defect at the umbilicus that measures about 2-3 cm across. It is easily reducible and nontender. There is no palpable groin or supraclavicular lymphadenopathy.   Rectal Note: On rectal exam the perirectal skin looks good. On digital exam there is good rectal tone and no palpable mass   Neurologic Neurologic evaluation reveals -alert and oriented x 3 with no impairment of recent or remote memory. Mental Status-Normal.  Musculoskeletal Normal Exam - Left-Upper Extremity Strength Normal and Lower Extremity Strength Normal. Normal Exam - Right-Upper Extremity Strength Normal and Lower Extremity Strength Normal.  Lymphatic Head & Neck  General Head & Neck Lymphatics: Bilateral - Description - Normal. Axillary  General Axillary Region: Bilateral - Description - Normal. Tenderness - Non Tender. Femoral & Inguinal  Generalized Femoral & Inguinal Lymphatics: Bilateral - Description - Normal. Tenderness - Non Tender.    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 She has finished her chemotherapy. At this point she does have a small ventral hernia at the umbilicus that is symptomatic for her. She would like to have this fixed. I have discussed with her in detail the risks and benefits of the operation to fix the hernia as well as some of the technical aspects including the use of mesh and the risk of injury to the bowel and she understands and wishes to proceed. I think she would be a good laparoscopic  candidate. She would like to plan for surgery in November. Current Plans Follow up with [place] in the office in 6 MONTHS.   Call us sooner as needed.

## 2017-03-09 NOTE — Anesthesia Postprocedure Evaluation (Signed)
Anesthesia Post Note  Patient: Bethany Frederick  Procedure(s) Performed: LAPAROSCOPIC VENTRAL HERNIA REPAIR (N/A Abdomen) INSERTION OF MESH (N/A Abdomen) REMOVAL PORT-A-CATH RIGHT CHEST (N/A Chest)     Patient location during evaluation: PACU Anesthesia Type: General Level of consciousness: awake and alert, oriented and patient cooperative Pain management: pain level controlled Vital Signs Assessment: post-procedure vital signs reviewed and stable Respiratory status: spontaneous breathing, nonlabored ventilation and respiratory function stable Cardiovascular status: blood pressure returned to baseline and stable Postop Assessment: no apparent nausea or vomiting Anesthetic complications: no    Last Vitals:  Vitals:   03/09/17 1318 03/09/17 1332  BP: 124/76 128/84  Pulse: 95 89  Resp: 13 19  Temp:    SpO2: 95% 96%    Last Pain:  Vitals:   03/09/17 1335  TempSrc:   PainSc: Asleep                 Adanna Zuckerman,E. Letisia Schwalb

## 2017-03-09 NOTE — Transfer of Care (Signed)
Immediate Anesthesia Transfer of Care Note  Patient: Bethany Frederick  Procedure(s) Performed: LAPAROSCOPIC VENTRAL HERNIA REPAIR (N/A Abdomen) INSERTION OF MESH (N/A Abdomen) REMOVAL PORT-A-CATH RIGHT CHEST (N/A Chest)  Patient Location: PACU  Anesthesia Type:General  Level of Consciousness: awake, alert , oriented and patient cooperative  Airway & Oxygen Therapy: Patient Spontanous Breathing and Patient connected to nasal cannula oxygen  Post-op Assessment: Report given to RN and Post -op Vital signs reviewed and stable  Post vital signs: Reviewed and stable  Last Vitals:  Vitals:   03/09/17 0754  BP: 140/74  Pulse: 67  Resp: 20  Temp: 36.7 C  SpO2: 98%    Last Pain:  Vitals:   03/09/17 0754  TempSrc: Oral      Patients Stated Pain Goal: 3 (47/07/61 5183)  Complications: No apparent anesthesia complications

## 2017-03-09 NOTE — Op Note (Signed)
03/09/2017  12:24 PM  PATIENT:  Bethany Frederick  66 y.o. female  PRE-OPERATIVE DIAGNOSIS:  VENTRAL HERNIA , PORT  POST-OPERATIVE DIAGNOSIS:  VENTRAL HERNIA , PORT  PROCEDURE:  Procedure(s): LAPAROSCOPIC VENTRAL HERNIA REPAIR (N/A) INSERTION OF MESH (N/A) REMOVAL PORT-A-CATH LEFT CHEST (N/A)  SURGEON:  Surgeon(s) and Role:    * Jovita Kussmaul, MD - Primary  PHYSICIAN ASSISTANT:   ASSISTANTS: Sharyn Dross, RNFA   ANESTHESIA:   local and general  EBL:  25 mL   BLOOD ADMINISTERED:none  DRAINS: none   LOCAL MEDICATIONS USED:  MARCAINE     SPECIMEN:  No Specimen  DISPOSITION OF SPECIMEN:  N/A  COUNTS:  YES  TOURNIQUET:  * No tourniquets in log *  DICTATION: .Dragon Dictation    After informed consent was obtained the patient was brought to the operating room and placed in the supine position on the operating room table.  After adequate induction of general anesthesia the patient's left chest was prepped with ChloraPrep, allowed to dry, and draped in the usual sterile manner.  An appropriate timeout was performed.  The area around the port was infiltrated with 0.25% Marcaine.  A small incision was made with a 15 blade knife through her old incision.  The incision was carried through the skin and subcutaneous tissue sharply with the 15 blade knife until the capsule surrounding the port was opened.  The 2 anchoring stitches were divided and removed.  The port was then pushed out of its pocket and with gentle traction was removed from the patient without difficulty.  Pressure was held on the area for several minutes until the area was completely hemostatic.  The deep layer of the wound was then closed with interrupted 3-0 Vicryl stitches.  The skin was then closed with a running 4-0 Monocryl subcuticular stitch.  Dermabond dressings were applied.  The drapes were then removed.  The abdomen was then prepped with ChloraPrep, allowed to dry, and draped in the usual sterile manner  including an Ioban drape.  An appropriate timeout was performed.  A site was chosen to access the abdominal cavity in the right upper quadrant.  A small incision was made in this area after infiltrating the area with quarter percent Marcaine.  A 5 mm Optiview port was used to bluntly dissected the layers of the abdominal wall under direct vision until access was gained to the abdominal cavity.  The abdomen was then insufflated with carbon dioxide without difficulty.  The abdomen was inspected and there were some filmy adhesions to the anterior abdominal wall.  Another 5 mm port was placed in the right lower quadrant under direct vision.  The filmy adhesions were taken down sharply with the Harmonic scalpel.  Once this was accomplished the abdominal wall was free and easy to visualize.  There was a hernia defect at about the level of the umbilicus.  The size of the defect was estimated using a spinal needle. An appropriate size mesh was chosen to allow at least 3-4 cm of overlap around the fascial defect.  I chose a 15 x 20 cm piece of Ventralight mesh.  8 #1 Novafil stitches were placed at equidistant points around the edges of the mesh with the mesh oriented with the coated side down.  Next a small incision was made through the center of the fascial defect with a 10 blade knife.  The incision was carried through the skin and subcutaneous tissue sharply with the electrocautery until the hernia was  entered.  The mesh was placed through the hernia defect into the abdominal cavity and was oriented appropriately.  The hernia sac was removed sharply with the electrocautery.  The fascial defect was then closed with interrupted #1 Novafil figure-of-eight stitches.  The abdomen was then insufflated again.  8 small stab incisions were made at points that corresponded to the anchor stitches.  A suture passer was used to bring the tails of each anchor stitch through the appropriate hole.  The anchor stitches was then cinched  down and tied without difficulty.  The mesh was observed to be in good approximation to the anterior abdominal wall.  The gaps between the stitches were filled then with absorbable tacks.  Again the mesh was observed to be in good apposition to the abdominal wall without any redundancy or folds.  The area was examined and found to be hemostatic.  At this point the gas was allowed to escape.  The ports were removed.  The incisions were closed with interrupted 4-0 Monocryl subcuticular stitches.  The incision at the umbilicus was closed with a deep layer of interrupted 3-0 Vicryl stitches followed by running 4-0 Monocryl subcuticular stitch.  Dermabond dressings were applied.  Patient tolerated the procedure well.  At the end of the case all needle sponge and instrument counts were correct.  The patient was then awakened and taken to recovery in stable condition.  PLAN OF CARE: Admit for overnight observation  PATIENT DISPOSITION:  PACU - hemodynamically stable.   Delay start of Pharmacological VTE agent (>24hrs) due to surgical blood loss or risk of bleeding: no

## 2017-03-09 NOTE — Anesthesia Procedure Notes (Signed)
Procedure Name: Intubation Date/Time: 03/09/2017 10:14 AM Performed by: Shirlyn Goltz Pre-anesthesia Checklist: Patient identified, Emergency Drugs available, Suction available and Patient being monitored Patient Re-evaluated:Patient Re-evaluated prior to induction Oxygen Delivery Method: Circle system utilized Preoxygenation: Pre-oxygenation with 100% oxygen Induction Type: IV induction Ventilation: Mask ventilation without difficulty Laryngoscope Size: Mac and 3 Grade View: Grade II Tube type: Oral Tube size: 7.0 mm Number of attempts: 1 Airway Equipment and Method: Stylet and LTA kit utilized Placement Confirmation: ETT inserted through vocal cords under direct vision,  positive ETCO2 and breath sounds checked- equal and bilateral Secured at: 20 cm Tube secured with: Tape Dental Injury: Teeth and Oropharynx as per pre-operative assessment

## 2017-03-10 ENCOUNTER — Encounter (HOSPITAL_COMMUNITY): Payer: Self-pay | Admitting: General Surgery

## 2017-03-10 DIAGNOSIS — Z452 Encounter for adjustment and management of vascular access device: Secondary | ICD-10-CM | POA: Diagnosis not present

## 2017-03-10 DIAGNOSIS — K439 Ventral hernia without obstruction or gangrene: Secondary | ICD-10-CM | POA: Diagnosis not present

## 2017-03-10 DIAGNOSIS — Z9049 Acquired absence of other specified parts of digestive tract: Secondary | ICD-10-CM | POA: Diagnosis not present

## 2017-03-10 DIAGNOSIS — Z85048 Personal history of other malignant neoplasm of rectum, rectosigmoid junction, and anus: Secondary | ICD-10-CM | POA: Diagnosis not present

## 2017-03-10 DIAGNOSIS — Z79899 Other long term (current) drug therapy: Secondary | ICD-10-CM | POA: Diagnosis not present

## 2017-03-10 DIAGNOSIS — Z9221 Personal history of antineoplastic chemotherapy: Secondary | ICD-10-CM | POA: Diagnosis not present

## 2017-03-10 MED ORDER — HYDROCODONE-ACETAMINOPHEN 5-325 MG PO TABS: 1.0000 | ORAL_TABLET | Freq: Four times a day (QID) | ORAL | 0 refills | Status: DC | PRN

## 2017-03-10 MED ORDER — METHOCARBAMOL 500 MG PO TABS: 500.0000 mg | ORAL_TABLET | Freq: Four times a day (QID) | ORAL | 0 refills | Status: DC | PRN

## 2017-03-10 NOTE — Progress Notes (Signed)
Discharge home. Home discharge instruction given, no question verbalized. 

## 2017-03-10 NOTE — Progress Notes (Signed)
1 Day Post-Op   Subjective/Chief Complaint: Complains of soreness but seems to be doing well. No nausea   Objective: Vital signs in last 24 hours: Temp:  [97.3 F (36.3 C)-98 F (36.7 C)] 98 F (36.7 C) (11/02 0450) Pulse Rate:  [67-95] 76 (11/02 0450) Resp:  [13-20] 18 (11/02 0450) BP: (105-150)/(46-84) 105/46 (11/02 0450) SpO2:  [91 %-98 %] 94 % (11/02 0450) Weight:  [88.9 kg (196 lb)] 88.9 kg (196 lb) (11/01 1800) Last BM Date: 03/09/17  Intake/Output from previous day: 11/01 0701 - 11/02 0700 In: 1680 [P.O.:480; I.V.:1200] Out: 25 [Blood:25] Intake/Output this shift: No intake/output data recorded.  General appearance: alert and cooperative Resp: clear to auscultation bilaterally Cardio: regular rate and rhythm GI: soft, appropriately tender. passing flatus  Lab Results:   Recent Labs  03/07/17 1014  WBC 5.6  HGB 13.5  HCT 42.0  PLT 169   BMET  Recent Labs  03/07/17 1014  NA 141  K 4.4  CL 107  CO2 27  GLUCOSE 97  BUN 14  CREATININE 0.60  CALCIUM 9.3   PT/INR No results for input(s): LABPROT, INR in the last 72 hours. ABG No results for input(s): PHART, HCO3 in the last 72 hours.  Invalid input(s): PCO2, PO2  Studies/Results: No results found.  Anti-infectives: Anti-infectives    Start     Dose/Rate Route Frequency Ordered Stop   03/09/17 0743  ceFAZolin (ANCEF) IVPB 2g/100 mL premix     2 g 200 mL/hr over 30 Minutes Intravenous On call to O.R. 03/09/17 0743 03/09/17 1015      Assessment/Plan: s/p Procedure(s): LAPAROSCOPIC VENTRAL HERNIA REPAIR (N/A) INSERTION OF MESH (N/A) REMOVAL PORT-A-CATH RIGHT CHEST (N/A) Advance diet Discharge  LOS: 0 days    TOTH III,Deveron Shamoon S 03/10/2017

## 2017-04-10 ENCOUNTER — Other Ambulatory Visit (HOSPITAL_BASED_OUTPATIENT_CLINIC_OR_DEPARTMENT_OTHER): Payer: Federal, State, Local not specified - PPO

## 2017-04-10 ENCOUNTER — Ambulatory Visit (HOSPITAL_COMMUNITY)
Admission: RE | Admit: 2017-04-10 | Discharge: 2017-04-10 | Disposition: A | Discharge status: Home / Self Care | Payer: Federal, State, Local not specified - PPO | Source: Ambulatory Visit | Attending: Hematology | Admitting: Hematology

## 2017-04-10 DIAGNOSIS — C186 Malignant neoplasm of descending colon: Secondary | ICD-10-CM

## 2017-04-10 DIAGNOSIS — R195 Other fecal abnormalities: Secondary | ICD-10-CM | POA: Insufficient documentation

## 2017-04-10 DIAGNOSIS — D252 Subserosal leiomyoma of uterus: Secondary | ICD-10-CM | POA: Diagnosis not present

## 2017-04-10 DIAGNOSIS — I517 Cardiomegaly: Secondary | ICD-10-CM | POA: Insufficient documentation

## 2017-04-10 DIAGNOSIS — C20 Malignant neoplasm of rectum: Secondary | ICD-10-CM

## 2017-04-10 DIAGNOSIS — K769 Liver disease, unspecified: Secondary | ICD-10-CM | POA: Diagnosis not present

## 2017-04-10 DIAGNOSIS — J984 Other disorders of lung: Secondary | ICD-10-CM | POA: Insufficient documentation

## 2017-04-10 DIAGNOSIS — K5730 Diverticulosis of large intestine without perforation or abscess without bleeding: Secondary | ICD-10-CM | POA: Insufficient documentation

## 2017-04-10 DIAGNOSIS — C19 Malignant neoplasm of rectosigmoid junction: Secondary | ICD-10-CM | POA: Diagnosis not present

## 2017-04-10 DIAGNOSIS — Z85038 Personal history of other malignant neoplasm of large intestine: Secondary | ICD-10-CM | POA: Insufficient documentation

## 2017-04-10 LAB — CBC WITH DIFFERENTIAL/PLATELET
BASO%: 1.4 % (ref 0.0–2.0)
Basophils Absolute: 0.1 10*3/uL (ref 0.0–0.1)
EOS%: 6.5 % (ref 0.0–7.0)
Eosinophils Absolute: 0.4 10*3/uL (ref 0.0–0.5)
HCT: 41.1 % (ref 34.8–46.6)
HEMOGLOBIN: 13.6 g/dL (ref 11.6–15.9)
LYMPH%: 23.9 % (ref 14.0–49.7)
MCH: 29.2 pg (ref 25.1–34.0)
MCHC: 33.1 g/dL (ref 31.5–36.0)
MCV: 88.1 fL (ref 79.5–101.0)
MONO#: 0.5 10*3/uL (ref 0.1–0.9)
MONO%: 8.3 % (ref 0.0–14.0)
NEUT%: 59.9 % (ref 38.4–76.8)
NEUTROS ABS: 3.6 10*3/uL (ref 1.5–6.5)
Platelets: 155 10*3/uL (ref 145–400)
RBC: 4.66 10*6/uL (ref 3.70–5.45)
RDW: 13.9 % (ref 11.2–14.5)
WBC: 6.1 10*3/uL (ref 3.9–10.3)
lymph#: 1.4 10*3/uL (ref 0.9–3.3)

## 2017-04-10 LAB — COMPREHENSIVE METABOLIC PANEL
ALBUMIN: 4 g/dL (ref 3.5–5.0)
ALK PHOS: 164 U/L — AB (ref 40–150)
ALT: 16 U/L (ref 0–55)
AST: 16 U/L (ref 5–34)
Anion Gap: 9 mEq/L (ref 3–11)
BILIRUBIN TOTAL: 0.45 mg/dL (ref 0.20–1.20)
BUN: 14.8 mg/dL (ref 7.0–26.0)
CO2: 25 meq/L (ref 22–29)
Calcium: 9.8 mg/dL (ref 8.4–10.4)
Chloride: 107 mEq/L (ref 98–109)
Creatinine: 0.7 mg/dL (ref 0.6–1.1)
GLUCOSE: 95 mg/dL (ref 70–140)
Potassium: 4.2 mEq/L (ref 3.5–5.1)
SODIUM: 140 meq/L (ref 136–145)
TOTAL PROTEIN: 6.8 g/dL (ref 6.4–8.3)

## 2017-04-10 LAB — CEA (IN HOUSE-CHCC): CEA (CHCC-In House): 2.17 ng/mL (ref 0.00–5.00)

## 2017-04-10 MED ORDER — IOPAMIDOL (ISOVUE-300) INJECTION 61%
INTRAVENOUS | Status: AC
  Filled 2017-04-10: qty 100

## 2017-04-10 MED ORDER — IOPAMIDOL (ISOVUE-300) INJECTION 61%
100.0000 mL | Freq: Once | INTRAVENOUS | Status: AC | PRN
  Administered 2017-04-10: 100 mL via INTRAVENOUS

## 2017-04-11 ENCOUNTER — Telehealth: Payer: Self-pay | Admitting: *Deleted

## 2017-04-11 NOTE — Telephone Encounter (Signed)
Called pt & informed of negative CT result.  She expressed appreciation & will be here on the 10th.

## 2017-04-11 NOTE — Telephone Encounter (Signed)
-----   Message from Truitt Merle, MD sent at 04/11/2017  9:00 AM EST ----- Her f/u is 12/10. Please let pt know her CT was negative for cancer recurrence, thanks.  Truitt Merle  04/11/2017

## 2017-04-17 ENCOUNTER — Ambulatory Visit: Payer: Federal, State, Local not specified - PPO | Admitting: Hematology

## 2017-04-24 ENCOUNTER — Telehealth: Payer: Self-pay | Admitting: Hematology

## 2017-04-24 NOTE — Telephone Encounter (Signed)
Rescheduled appts from 12/10 - spoke with patient regarding appts.

## 2017-05-08 ENCOUNTER — Ambulatory Visit (HOSPITAL_BASED_OUTPATIENT_CLINIC_OR_DEPARTMENT_OTHER): Payer: Federal, State, Local not specified - PPO | Admitting: Adult Health

## 2017-05-08 ENCOUNTER — Encounter: Payer: Self-pay | Admitting: Adult Health

## 2017-05-08 VITALS — BP 151/67 | HR 72 | Temp 98.0°F | Resp 20 | Ht 67.0 in | Wt 199.9 lb

## 2017-05-08 DIAGNOSIS — C186 Malignant neoplasm of descending colon: Secondary | ICD-10-CM | POA: Diagnosis not present

## 2017-05-08 NOTE — Progress Notes (Signed)
[facility] Follow up:    Bethany Rasmussen, MD [address] [place] [address]   DIAGNOSIS: Cancer Staging Cancer of left colon [facility]) Staging form: Colon and Rectum, AJCC 7th Edition - Pathologic stage from 01/28/2016: Stage IIIC (T3, N2b, cM0) - Signed by Truitt Merle, MD on 02/12/2016   SUMMARY OF ONCOLOGIC HISTORY: Oncology History   Rectal cancer [facility])   Staging form: Colon and Rectum, AJCC 7th Edition   - Pathologic stage from 01/28/2016: Stage IIIC (T3, N2b, cM0) - Signed by Truitt Merle, MD on 02/12/2016      Cancer of left colon ([place])   01/04/2016 Procedure    Colonoscopy by Dr. Estanislado Emms showed a partially obstructing tumor in the rectal sigmoid colon, biopsied.      01/05/2016 Imaging    CT abdomen and pelvis with contrast showed no dominant sigmoid colon mass, soft tissue fullness at the rectosigmoid junction, suspicious for the primary lesion. Borderline enlarged nodes in the sigmoid mesocolon and at the origin of the inferior mesenteric artery are indeterminate no evidence of December metastasis. Left hepatic lobe giant hemangioma.       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      02/24/2016 Imaging    CT chest w/o contrast IMPRESSION: 1. No evidence of pulmonary metastasis. 2. Stable large hepatic hemangioma.      03/08/2016 - 08/10/2016 Adjuvant Chemotherapy    Adjuvant chemotherapy with CAPOX (Xeloda 2000 mg twice daily, on day 1-14, oxaliplatin 125mg /m2 on day 1) every 3 weeks,  Oxaliplatin held after 4 cycles and she had additional 4 cycles of Xeloda.       09/14/2016 Imaging    CT CAP W  Contrast IMPRESSION: No evidence of locally recurrent or metastatic carcinoma.  Small periumbilical hernia containing small bowel.  Stable 5 cm benign hemangioma in left hepatic lobe.  Stable small calcified uterine fibroid.  Colonic diverticulosis. No radiographic evidence of diverticulitis.      09/14/2016 Imaging    CT CAP w contrast  IMPRESSION: No evidence of locally recurrent or metastatic carcinoma.  Small periumbilical hernia containing small bowel.  Stable 5 cm benign hemangioma in left hepatic lobe.  Stable small calcified uterine fibroid.  Colonic diverticulosis. No radiographic evidence of diverticulitis.       CURRENT THERAPY: observation  INTERVAL HISTORY: Bethany Frederick 66 y.o. female returns for evaluation of her h/o colon cancer.  She is doing well today.  She underwent scans in early December, and lab work around that time as well.  Her appointment was rescheduled due to the snow.  Both scans and CEA show no cancer activity.  She is doing well today and a ROS was non contributory.  She did undergo hernia surgery and has gained about 9 pounds since we last saw her.     Patient Active Problem List   Diagnosis Date Noted  . Ventral hernia without obstruction or gangrene 03/09/2017  . Port catheter in place 03/15/2016  . Cancer of left colon ([place]) 01/28/2016  . Sclerosing adenosis of left breast 05/14/2015    is allergic to no known  allergies.  MEDICAL HISTORY: Past Medical History:  Diagnosis Date  . Arthritis    "mainly in my back, feet, hips" (03/09/2017)  . Cancer of sigmoid colon ([place]) 2017  . Cataract, immature   . GERD (gastroesophageal reflux disease)   . Heart palpitations   . History of blood transfusion 2002   "related to endometrosis"  . 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 Seasonal allergies   . Wears partial dentures    upper front - 1 tooth    SURGICAL HISTORY: Past Surgical History:  Procedure Laterality Date   . APPENDECTOMY    . BREAST BIOPSY Left 2016  . BREAST LUMPECTOMY Left 03/2015  . BREAST LUMPECTOMY WITH RADIOACTIVE SEED LOCALIZATION Left 03/13/2015   Procedure: BREAST LUMPECTOMY WITH RADIOACTIVE SEED LOCALIZATION;  Surgeon: Autumn Messing III, MD;  Location: [place];  Service: General;  Laterality: Left;  . COLECTOMY    . COLONOSCOPY    . ENDOMETRIAL FULGURATION  06/13/2000  . HERNIA REPAIR    . INGUINAL HERNIA REPAIR  03/05/2012   Procedure: HERNIA REPAIR INGUINAL INCARCERATED;  Surgeon: Ralene Ok, MD;  Location: [place];  Service: General;  Laterality: left;  Marland Kitchen INGUINAL HERNIA REPAIR Left   . INSERTION OF MESH  03/05/2012   Procedure: INSERTION OF MESH;  Surgeon: Ralene Ok, MD;  Location: [place];  Service: General;  Laterality: Left;  . INSERTION OF MESH N/A 03/09/2017   Procedure: INSERTION OF MESH;  Surgeon: Jovita Kussmaul, MD;  Location: [place];  Service: General;  Laterality: N/A;  . LAPAROSCOPIC ASSISTED VENTRAL HERNIA REPAIR  03/09/2017   w/mesh  . LAPAROSCOPIC SIGMOID COLECTOMY Bilateral 01/28/2016   Procedure: LAPAROSCOPIC ASSISTED SIGMOID COLECTOMY;  Surgeon: Autumn Messing III, MD;  Location: [place];  Service: General;  Laterality: Bilateral;  . MYOMECTOMY  06/13/2000  . PORT-A-CATH REMOVAL Right 03/09/2017  . PORT-A-CATH REMOVAL N/A 03/09/2017   Procedure: REMOVAL PORT-A-CATH RIGHT CHEST;  Surgeon: Jovita Kussmaul, MD;  Location: [place];  Service: General;  Laterality: N/A;  . PORTACATH PLACEMENT N/A 02/18/2016   Procedure: INSERTION PORT-A-CATH;  Surgeon: Autumn Messing III, MD;  Location: [place];  Service: General;  Laterality: N/A;  . SHOULDER ARTHROSCOPY W/ ROTATOR CUFF REPAIR Left 2013  . VENTRAL HERNIA REPAIR N/A 03/09/2017   Procedure: LAPAROSCOPIC VENTRAL HERNIA REPAIR;  Surgeon: Jovita Kussmaul, MD;  Location: [place];  Service: General;  Laterality: N/A;    SOCIAL HISTORY: Social History   Socioeconomic History  . Marital status: Married     Spouse name: Not on file  . Number of children: Not on file  . Years of education: Not on file  . Highest education level: Not on file  Social Needs  . Financial resource strain: Not on file  . Food insecurity - worry: Not on file  . Food insecurity - inability: Not on file  . Transportation needs - medical: Not on file  . Transportation needs - non-medical: Not on file  Occupational History  . Not on file  Tobacco Use  . Smoking status: Never Smoker  . Smokeless tobacco: Never Used  Substance and Sexual Activity  . Alcohol use: Yes    Alcohol/week: 4.2 oz    Types: 7 Glasses of wine per week  . Drug use: No  . Sexual activity: Not Currently  Other Topics Concern  . Not on file  Social History Narrative  . Not on file    FAMILY HISTORY: Family History  Problem Relation Age of Onset  . Cancer Brother 59       prostate cancer   . Cancer Paternal Aunt 53       breast cancer   . Cancer Cousin 40       ovarian and breast cancer (maternal cousin)     Review of Systems  Constitutional: Negative for appetite change, chills, fatigue, fever and unexpected weight change.  HENT:   Negative for hearing loss, lump/mass and trouble swallowing.   Eyes: Negative for eye problems and icterus.  Respiratory: Negative for chest tightness, cough and shortness of breath.   Cardiovascular: Negative for chest pain, leg swelling and palpitations.  Gastrointestinal: Negative for abdominal distention, abdominal pain, constipation, diarrhea, nausea and vomiting.  Endocrine: Negative for hot flashes.  Genitourinary: Negative for difficulty urinating.   Musculoskeletal: Negative for arthralgias.  Skin: Negative for itching and rash.  Neurological: Negative for dizziness, extremity weakness, headaches and numbness.  Hematological: Negative for adenopathy. Does not bruise/bleed easily.  Psychiatric/Behavioral: Negative for depression. The patient is not nervous/anxious.       PHYSICAL  EXAMINATION  ECOG PERFORMANCE STATUS: 0 - Asymptomatic  Vitals:   05/08/17 1324  BP: (!) 151/67  Pulse: 72  Resp: 20  Temp: 98 F (36.7 C)  SpO2: 99%    Physical Exam  Constitutional: She is oriented to person, place, and time and well-developed, well-nourished, and in no distress.  HENT:  Head: Normocephalic and atraumatic.  Mouth/Throat: Oropharynx is clear and moist. No oropharyngeal exudate.  Eyes: Pupils are equal, round, and reactive to light. No scleral icterus.  Neck: Neck supple.  Cardiovascular: Normal rate, regular rhythm and normal heart sounds.  Pulmonary/Chest: Effort normal and breath sounds normal.  Abdominal: Soft. Bowel sounds are normal. She exhibits no distension and no mass. There is no tenderness. There is no rebound and no guarding.  Musculoskeletal: She exhibits no edema.  Lymphadenopathy:    She has no cervical adenopathy.  Neurological: She is alert and oriented to person, place, and time.  Skin: Skin is warm and dry. No rash noted.  Psychiatric: Mood and affect normal.    LABORATORY DATA:  CBC    Component Value Date/Time   WBC 6.1 04/10/2017 1017   WBC 5.6 03/07/2017 1014   RBC 4.66 04/10/2017 1017   RBC 4.70 03/07/2017 1014   HGB 13.6 04/10/2017 1017   HCT 41.1 04/10/2017 1017   PLT 155 04/10/2017 1017   MCV 88.1 04/10/2017 1017   MCH 29.2 04/10/2017 1017   MCH 28.7 03/07/2017 1014   MCHC 33.1 04/10/2017 1017   MCHC 32.1 03/07/2017 1014   RDW 13.9 04/10/2017 1017   LYMPHSABS 1.4 04/10/2017 1017   MONOABS 0.5 04/10/2017 1017   EOSABS 0.4 04/10/2017 1017   BASOSABS 0.1 04/10/2017 1017    CMP     Component Value Date/Time   NA 140 04/10/2017 1017   K 4.2 04/10/2017 1017   CL 107 03/07/2017 1014   CO2 25 04/10/2017 1017   GLUCOSE 95 04/10/2017 1017   BUN 14.8 04/10/2017 1017   CREATININE 0.7 04/10/2017 1017   CALCIUM 9.8 04/10/2017 1017   PROT 6.8 04/10/2017 1017   ALBUMIN 4.0 04/10/2017 1017   AST 16 04/10/2017 1017   ALT  16 04/10/2017 1017   ALKPHOS 164 (H) 04/10/2017 1017   BILITOT 0.45 04/10/2017 1017   GFRNONAA >60 03/07/2017 1014   GFRAA >60 03/07/2017 1014     ASSESSMENT and PLAN:   Cancer of  left colon ([place]) Colon Cancer, stage IIIC, treated with surgery on 01/28/2016, adjuvant chemotherapy with CAPOX completing therapy on 08/10/2016.    Bethany Frederick has been undergoing routine surveillance with CT scans and CEA testing.  She has remained clinically and radiographically without recurrence.  She is doing well today. Her CEA remains normal.  I reviewed this with her in detail.  I gave her a copy of her lab work and her CT scan results which were also negative for recurrence.  She was recommended healthy diet and exercise.  She will f/u with Dr. Burr Medico in 4 months time for labs and evaluation.    I reviewed Bethany Frederick with Dr. Burr Medico, who is in agreement with this plan.     All questions were answered. The patient knows to call the clinic with any problems, questions or concerns. We can certainly see the patient much sooner if necessary.  A total of (30) minutes of face-to-face time was spent with this patient with greater than 50% of that time in counseling and care-coordination.  This note was electronically signed. Scot Dock, NP 05/08/2017

## 2017-05-08 NOTE — Assessment & Plan Note (Signed)
Colon Cancer, stage IIIC, treated with surgery on 01/28/2016, adjuvant chemotherapy with CAPOX completing therapy on 08/10/2016.    Bethany Frederick has been undergoing routine surveillance with CT scans and CEA testing.  She has remained clinically and radiographically without recurrence.  She is doing well today. Her CEA remains normal.  I reviewed this with her in detail.  I gave her a copy of her lab work and her CT scan results which were also negative for recurrence.  She was recommended healthy diet and exercise.  She will f/u with Dr. Burr Medico in 4 months time for labs and evaluation.    I reviewed Bethany Frederick with Dr. Burr Medico, who is in agreement with this plan.

## 2017-05-10 ENCOUNTER — Telehealth: Payer: Self-pay | Admitting: Hematology

## 2017-05-10 NOTE — Telephone Encounter (Signed)
Left message for patient regarding upcoming April appointments.  °

## 2017-05-30 ENCOUNTER — Other Ambulatory Visit: Payer: Self-pay | Admitting: Family Medicine

## 2017-05-30 DIAGNOSIS — C189 Malignant neoplasm of colon, unspecified: Secondary | ICD-10-CM

## 2017-06-06 DIAGNOSIS — H2513 Age-related nuclear cataract, bilateral: Secondary | ICD-10-CM | POA: Diagnosis not present

## 2017-06-06 DIAGNOSIS — H04123 Dry eye syndrome of bilateral lacrimal glands: Secondary | ICD-10-CM | POA: Diagnosis not present

## 2017-06-06 DIAGNOSIS — H353131 Nonexudative age-related macular degeneration, bilateral, early dry stage: Secondary | ICD-10-CM | POA: Diagnosis not present

## 2017-06-06 DIAGNOSIS — H40023 Open angle with borderline findings, high risk, bilateral: Secondary | ICD-10-CM | POA: Diagnosis not present

## 2017-06-09 ENCOUNTER — Ambulatory Visit (HOSPITAL_COMMUNITY): Payer: Federal, State, Local not specified - PPO | Attending: Cardiology

## 2017-06-09 ENCOUNTER — Other Ambulatory Visit: Payer: Self-pay

## 2017-06-09 DIAGNOSIS — K219 Gastro-esophageal reflux disease without esophagitis: Secondary | ICD-10-CM | POA: Insufficient documentation

## 2017-06-09 DIAGNOSIS — I071 Rheumatic tricuspid insufficiency: Secondary | ICD-10-CM | POA: Insufficient documentation

## 2017-06-09 DIAGNOSIS — Z8249 Family history of ischemic heart disease and other diseases of the circulatory system: Secondary | ICD-10-CM | POA: Insufficient documentation

## 2017-06-09 DIAGNOSIS — K5900 Constipation, unspecified: Secondary | ICD-10-CM | POA: Insufficient documentation

## 2017-06-09 DIAGNOSIS — F4310 Post-traumatic stress disorder, unspecified: Secondary | ICD-10-CM | POA: Insufficient documentation

## 2017-06-09 DIAGNOSIS — G629 Polyneuropathy, unspecified: Secondary | ICD-10-CM | POA: Diagnosis not present

## 2017-06-09 DIAGNOSIS — C50919 Malignant neoplasm of unspecified site of unspecified female breast: Secondary | ICD-10-CM | POA: Insufficient documentation

## 2017-06-09 DIAGNOSIS — C189 Malignant neoplasm of colon, unspecified: Secondary | ICD-10-CM | POA: Diagnosis not present

## 2017-08-29 NOTE — Progress Notes (Signed)
[place]  Telephone:([phone]) [phone] Fax:(336) [phone]  Clinic Follow up Note   Patient Care Team: [name], MD as PCP - General (Family Medicine) Jovita Kussmaul, MD as Consulting Physician (General Surgery) 08/30/2017   CHIEF COMPLAINTS:  F/u stage III colon cancer   Oncology History   Rectal cancer [facility])   Staging form: Colon and Rectum, AJCC 7th Edition   - Pathologic stage from 01/28/2016: Stage IIIC (T3, N2b, cM0) - Signed by Truitt Merle, MD on 02/12/2016      Cancer of left colon ([place])   01/04/2016 Procedure    Colonoscopy by Dr. Estanislado Emms showed a partially obstructing tumor in the rectal sigmoid colon, biopsied.      01/05/2016 Imaging    CT abdomen and pelvis with contrast showed no dominant sigmoid colon mass, soft tissue fullness at the rectosigmoid junction, suspicious for the primary lesion. Borderline enlarged nodes in the sigmoid mesocolon and at the origin of the inferior mesenteric artery are indeterminate no evidence of December metastasis. Left hepatic lobe giant hemangioma.       01/28/2016 Initial Diagnosis    Rectal cancer ([place])      01/28/2016 Surgery    Endoscopic low anterior resection of rectosigmoid segmental colon for tumor       01/28/2016 Pathology Results    Invasive colonic adenocarcinoma, 4.5 cm extending into her chronic connective tissue, lymphovascular involvement by tumor, margins were negative, metastatic carcinoma in 9 of 21 lymph nodes.      02/24/2016 Imaging    CT chest w/o contrast IMPRESSION: 1. No evidence of pulmonary metastasis. 2. Stable large hepatic hemangioma.      03/08/2016 - 08/10/2016 Adjuvant Chemotherapy    Adjuvant chemotherapy with CAPOX (Xeloda 2000 mg twice daily, on day 1-14, oxaliplatin 146m/m2 on day 1) every 3 weeks,  Oxaliplatin held after 4 cycles and she had additional 4 cycles of Xeloda.       09/14/2016 Imaging    CT CAP W Contrast IMPRESSION: No evidence of locally recurrent or  metastatic carcinoma. Small periumbilical hernia containing small bowel. Stable 5 cm benign hemangioma in left hepatic lobe. Stable small calcified uterine fibroid. Colonic diverticulosis. No radiographic evidence of diverticulitis.      09/14/2016 Imaging    CT CAP w contrast  IMPRESSION: No evidence of locally recurrent or metastatic carcinoma. Small periumbilical hernia containing small bowel. Stable 5 cm benign hemangioma in left hepatic lobe. Stable small calcified uterine fibroid. Colonic diverticulosis. No radiographic evidence of diverticulitis.      04/10/2017 Imaging    CT CAP W Contrast 04/10/17 IMPRESSION: 1. No findings of recurrent malignancy. The hepatic lesions have benign imaging characteristics. 2. Interval ventral hernia repair without complicating feature. 3. Other imaging findings of potential clinical significance: Aortic Atherosclerosis (ICD10-I70.0). Mild cardiomegaly. Stable scarring in both lungs. Sigmoid diverticulosis. Prominent stool throughout the colon favors constipation. Calcified anterior subserosal fibroid along the uterus.       HISTORY OF PRESENTING ILLNESS (05/14/2015):  Bethany KOURY67y.o. female is here to discussed her risk of breast cancer.   She has had palpable left breast mass for over 20 years, unchanged. Her mammogram in 2014 showed a 0.8 cm cyst. She does screening mammogram once a year. The lesion looks slightly more suspicious on the mammogram in August 2016, and a biopsy was recommended, which showed sclerosing lesion with typical ductal hyperplasia. She was referred to see Dr. TMarlou Starksand underwent left breast lumpectomy. She tolerated the surgery  very well, and has recovered completely.  She feels well, has mild fatigue, she takes care two of her brothers, who live alone and have health issues. She lives with her hsuband and 43 yo son. She remains physically active, does not exercise regularly. She has mild arthritis, takes ibuprofen  and Aleve as needed, no other complaints.  GYN HISTORY  Menarchal: 10 LMP: 23 Contraceptive: 67 years  HRT: 5 years, stopped at age of 67  G1P1: one son, she was 67 yo when she had her son   CURRENT THERAPY: Surveillance  INTERIM HISTORY  KYMIAH ARAIZA returns for follow-up. She presents to the clinic today by herself. She reports she is doing well overall. She notes she has gained some weight since last summer and she is trying to work on it. Her only residual effect from chemo is mild neuropathy in her feet.   On review of systems, pt denies abdominal bloating, nausea or any other complaints at this time. Pertinent positives are listed and detailed within the above HPI.   MEDICAL HISTORY:  Past Medical History:  Diagnosis Date  . Arthritis    "mainly in my back, feet, hips" (03/09/2017)  . Cancer of sigmoid colon ([place]) 2017  . Cataract, immature   . GERD (gastroesophageal reflux disease)   . Heart palpitations   . History of blood transfusion 2002   "related to endometrosis"  . 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 Seasonal allergies   . Wears partial dentures    upper front - 1 tooth    SURGICAL HISTORY: Past Surgical History:  Procedure Laterality Date  . APPENDECTOMY    . BREAST BIOPSY Left 2016  . BREAST LUMPECTOMY Left 03/2015  . BREAST LUMPECTOMY WITH RADIOACTIVE SEED LOCALIZATION Left 03/13/2015   Procedure: BREAST LUMPECTOMY WITH RADIOACTIVE SEED LOCALIZATION;  Surgeon: Autumn Messing III, MD;  Location: [place];  Service: General;  Laterality: Left;  . COLECTOMY    . COLONOSCOPY    . ENDOMETRIAL FULGURATION  06/13/2000  . HERNIA REPAIR    . INGUINAL HERNIA REPAIR  03/05/2012   Procedure: HERNIA REPAIR INGUINAL INCARCERATED;  Surgeon: Ralene Ok, MD;  Location: [place];  Service: General;  Laterality: left;  Marland Kitchen INGUINAL HERNIA REPAIR Left   . INSERTION OF MESH  03/05/2012   Procedure: INSERTION OF MESH;  Surgeon: Ralene Ok, MD;   Location: [place];  Service: General;  Laterality: Left;  . INSERTION OF MESH N/A 03/09/2017   Procedure: INSERTION OF MESH;  Surgeon: Jovita Kussmaul, MD;  Location: [place];  Service: General;  Laterality: N/A;  . LAPAROSCOPIC ASSISTED VENTRAL HERNIA REPAIR  03/09/2017   w/mesh  . LAPAROSCOPIC SIGMOID COLECTOMY Bilateral 01/28/2016   Procedure: LAPAROSCOPIC ASSISTED SIGMOID COLECTOMY;  Surgeon: Autumn Messing III, MD;  Location: [place];  Service: General;  Laterality: Bilateral;  . MYOMECTOMY  06/13/2000  . PORT-A-CATH REMOVAL Right 03/09/2017  . PORT-A-CATH REMOVAL N/A 03/09/2017   Procedure: REMOVAL PORT-A-CATH RIGHT CHEST;  Surgeon: Jovita Kussmaul, MD;  Location: [place];  Service: General;  Laterality: N/A;  . PORTACATH PLACEMENT N/A 02/18/2016   Procedure: INSERTION PORT-A-CATH;  Surgeon: Autumn Messing III, MD;  Location: [place];  Service: General;  Laterality: N/A;  . SHOULDER ARTHROSCOPY W/ ROTATOR CUFF REPAIR Left 2013  . VENTRAL HERNIA REPAIR N/A 03/09/2017   Procedure: LAPAROSCOPIC VENTRAL HERNIA REPAIR;  Surgeon: Jovita Kussmaul, MD;  Location: [place];  Service: General;  Laterality: N/A;  SOCIAL HISTORY: Social History   Socioeconomic History  . Marital status: Married    Spouse name: Not on file  . Number of children: Not on file  . Years of education: Not on file  . Highest education level: Not on file  Occupational History  . Not on file  Social Needs  . Financial resource strain: Not on file  . Food insecurity:    Worry: Not on file    Inability: Not on file  . Transportation needs:    Medical: Not on file    Non-medical: Not on file  Tobacco Use  . Smoking status: Never Smoker  . Smokeless tobacco: Never Used  Substance and Sexual Activity  . Alcohol use: Yes    Alcohol/week: 4.2 oz    Types: 7 Glasses of wine per week  . Drug use: No  . Sexual activity: Not Currently  Lifestyle  . Physical activity:    Days per week: Not on file    Minutes per session:  Not on file  . Stress: Not on file  Relationships  . Social connections:    Talks on phone: Not on file    Gets together: Not on file    Attends religious service: Not on file    Active member of club or organization: Not on file    Attends meetings of clubs or organizations: Not on file    Relationship status: Not on file  . Intimate partner violence:    Fear of current or ex partner: Not on file    Emotionally abused: Not on file    Physically abused: Not on file    Forced sexual activity: Not on file  Other Topics Concern  . Not on file  Social History Narrative  . Not on file    FAMILY HISTORY: Family History  Problem Relation Age of Onset  . Cancer Brother 38       prostate cancer   . Cancer Paternal Aunt 30       breast cancer   . Cancer Cousin 40       ovarian and breast cancer (maternal cousin)     ALLERGIES:  is allergic to no known allergies.  MEDICATIONS:  Current Outpatient Medications  Medication Sig Dispense Refill  . clobetasol cream (TEMOVATE) 1.61 % Apply 1 application topically 2 (two) times daily as needed (RASH).     . famotidine (PEPCID) 20 MG tablet Take 20 mg by mouth daily as needed for heartburn or indigestion.   0  . ibuprofen (ADVIL,MOTRIN) 200 MG tablet Take 400 mg by mouth 2 (two) times daily as needed for mild pain.    Vladimir Faster Glycol-Propyl Glycol (SYSTANE OP) Apply 1 drop to eye 2 (two) times daily.    . Vitamin D, Ergocalciferol, (DRISDOL) 50000 units CAPS capsule Take 1.25 Units by mouth once a week.     No current facility-administered medications for this visit.    Facility-Administered Medications Ordered in Other Visits  Medication Dose Route Frequency Provider Last Rate Last Dose  . sodium chloride flush (NS) 0.9 % injection 10 mL  10 mL Intravenous PRN Truitt Merle, MD   10 mL at 05/31/16 1303    REVIEW OF SYSTEMS:  Constitutional: Denies fevers, chills or abnormal night sweats  Eyes: Denies blurriness of vision, double  vision or watery eyes Ears, nose, mouth, throat, and face: Denies mucositis or sore throat Respiratory: Denies cough, dyspnea or wheezes Cardiovascular: Denies palpitation, chest discomfort Gastrointestinal: Negtive (+) abdominal  hernia repaired Skin: Denies abnormal skin rashes.  Lymphatics: Denies new lymphadenopathy or easy bruising Neurological: (+) neuropathy in feet Behavioral/Psych: Mood is stable, no new changes  All other systems were reviewed with the patient and are negative.  PHYSICAL EXAMINATION: ECOG PERFORMANCE STATUS: 0 BP 139/68 (BP Location: Left Arm, Patient Position: Sitting)   Pulse 61   Temp 97.9 F (36.6 C) (Oral)   Resp 18   Ht 5' 7"  (1.702 m)   Wt 209 lb 1.6 oz (94.8 kg)   SpO2 98%   BMI 32.75 kg/m   GENERAL:alert, no distress and comfortable  SKIN: skin color, texture, turgor are normal, no rashes or significant lesions EYES: normal, conjunctiva are pink and non-injected, sclera clear OROPHARYNX:no exudate, no erythema and lips, buccal mucosa, and tongue normal  NECK: supple, thyroid normal size, non-tender, without nodularity LYMPH:  no palpable lymphadenopathy in the cervical, axillary or inguinal LUNGS: clear to auscultation and percussion with normal breathing effort HEART: regular rate & rhythm and no murmurs and no lower extremity edema ABDOMEN:abdomen soft, non-tender and normal bowel sounds, midline incision has healed well, no discharge or skin erythema.  Musculoskeletal:no cyanosis of digits and no clubbing.  PSYCH: alert & oriented x 3 with fluent speech NEURO: no focal motor/sensory deficits  LABORATORY DATA:  I have reviewed the data as listed CBC Latest Ref Rng & Units 08/30/2017 04/10/2017 03/07/2017  WBC 3.9 - 10.3 K/uL 5.7 6.1 5.6  Hemoglobin 11.6 - 15.9 g/dL 13.3 13.6 13.5  Hematocrit 34.8 - 46.6 % 40.3 41.1 42.0  Platelets 145 - 400 K/uL 174 155 169   CMP Latest Ref Rng & Units 08/30/2017 04/10/2017 03/07/2017  Glucose 70 - 140  mg/dL 107 95 97  BUN 7 - 26 mg/dL 17 14.8 14  Creatinine 0.60 - 1.10 mg/dL 0.69 0.7 0.60  Sodium 136 - 145 mmol/L 140 140 141  Potassium 3.5 - 5.1 mmol/L 4.5 4.2 4.4  Chloride 98 - 109 mmol/L 108 - 107  CO2 22 - 29 mmol/L 22 25 27   Calcium 8.4 - 10.4 mg/dL 9.4 9.8 9.3  Total Protein 6.4 - 8.3 g/dL 6.3(L) 6.8 -  Total Bilirubin 0.2 - 1.2 mg/dL 0.4 0.45 -  Alkaline Phos 40 - 150 U/L 117 164(H) -  AST 5 - 34 U/L 17 16 -  ALT 0 - 55 U/L 15 16 -   Results for KATANYA, SCHLIE (MRN [id number]) as of 09/21/2016 13:49  Ref. Range 06/29/2016 10:28 07/18/2016 07:36 09/14/2016 12:31  CEA ([facility]) Latest Ref Range: 0.00 - 5.00 ng/mL 2.69 2.52 2.82     PATHOLOGY REPORT  Diagnosis 01/04/2016 Colon, biopsy, rectosigmoid junction - ADENOCARCINOMA, SEE COMMENT.  Diagnosis 01/28/2016 1. Soft tissue, biopsy, Implant on sigmoid colon - BENIGN CALCIFIED NODULE. - NO EVIDENCE OF MALIGNANCY. 2. Colon, segmental resection for tumor, Recto-sigmoid - INVASIVE COLONIC ADENOCARCINOMA, 4.5 CM EXTENDING INTO PERICOLONIC CONNECTIVE TISSUE. - LYMPHATIC VASCULAR INVOLVEMENT BY TUMOR. - MARGINS NOT INVOLVED. - METASTATIC CARCINOMA IN 9 OF 21 LYMPH NODES (9/21). - SEE ONCOLOGY TABLE BELOW. 3. Colon, resection margin (donut), Proximal and distal of sigmoid - BENIGN PROXIMAL AND DISTAL ANASTOMATIC RINGS. - NO EVIDENCE OF MALIGNANCY. Specimen Gross and Clinical Information  RADIOGRAPHIC STUDIES: I have personally reviewed the radiological images as listed and agreed with the findings in the report. No results found.   CT CAP W Contrast 04/10/17 IMPRESSION: 1. No findings of recurrent malignancy. The hepatic lesions have benign imaging characteristics. 2. Interval ventral hernia repair without  complicating feature. 3. Other imaging findings of potential clinical significance: Aortic Atherosclerosis (ICD10-I70.0). Mild cardiomegaly. Stable scarring in both lungs. Sigmoid diverticulosis. Prominent stool  throughout the colon favors constipation. Calcified anterior subserosal fibroid along the uterus.  CT abdomen and pelvis with contrast 01/05/2016 IMPRESSION: 1. No dominant sigmoid colon mass identified. There is underdistention and soft tissue fullness at the rectosigmoid junction, suspicious for the primary lesion. Borderline enlarged nodes in the sigmoid mesocolon and at the origin of the inferior mesenteric artery are indeterminate. As these are in the drainage pathway for a sigmoid primary, localized nodal metastasis cannot be excluded. 2. No evidence of distant metastatic disease. 3.  Aortic atherosclerosis. 4. Uterine fibroid. 5. Left hepatic lobe giant hemangioma, as before.  CT chest 02/24/2016 IMPRESSION: 1. No evidence of pulmonary metastasis. 2. Stable large hepatic hemangioma.  Colonoscopy 01/04/2016 Dr. Watt Climes Findings: An ulcerated partially obstructing large mass was found in the rectosigmoid colon. The mass was partially circumferential, measured about 5 cm in length. Biopsy was taken. Multiple small milestone diverticula were found in the sigmoid colon. The exam otherwise was normal.  CT CAP w contrast 09/14/2016 IMPRESSION: No evidence of locally recurrent or metastatic carcinoma. Small periumbilical hernia containing small bowel. Stable 5 cm benign hemangioma in left hepatic lobe. Stable small calcified uterine fibroid. Colonic diverticulosis. No radiographic evidence of diverticulitis.  ASSESSMENT & PLAN:  67 y.o. Caucasian female, with recently diagnosed colorectal cancer   1. Colorectal cancer, invasive adenocarcinoma,pT3N2bM0, stage IIIC, MSI-stable  -Her primary cancer is located in the rectosigmoid colon. -I previously reviewed her staging scan findings with patient and her husband. -She had complete surgical resection in sept 2017 -Giving the multiple positive lymph nodes, and locally advanced disease, she is at high risk for recurrence. -she  received adjuvant chemotherapy CAPOX for 8 cycles, oxaliplatin was held after cycle 4 due to poor tolerance. -She has recovered well from adjuvant chemotherapy, with mild residual neuropathy which is improving. -I previously reviewed her surveillance CT scan from 09/14/2016 and 04/10/17, which showed no evidence of recurrence -Port removed during hernia repair surgery on 03/09/17 -I encouraged her to be active and eat a healthy balanced diet and try to loose some weight  -She is clinically doing very well, lab reviewed, physical exam today was unremarkable, no clinical concern for recurrence. Continue colon cancer surveillance. I'll plan to see her back in 4 months with lab  -I discussed the surveillance plan with her. She will have a CT CAP W Contrast before our next visit in August 2019. She will be 2 years out from diagnosis at that time and then we will f/u every 6 months with another scan in August 2020.    2. 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 This is considered a benign breast lesion, not high risk for breast cancer. -Continue annual screening mammogram, self exam and routine follow-up.  3. 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 -neuropathy in her hands is resolved, she still has some numbness in her feet. No balance issues  4. Overweight  -She has gained 20 pounds since last August, mostly related to diet and less physical activities.  I encouraged her to have healthy diet, and exercise.  She is willing to lose some weight.  Plan -Lab reviewed, she is doing well,  no clinical concern for recurrence -Return for follow-up in 4 months, with lab and CT chest, abdomen and pelvis with contrast a few days before  All  questions were answered. The patient knows to call the clinic with any problems, questions or concerns.  I spent 20 minutes counseling the patient face to face. The total time spent in the appointment was 25 minutes and more than 50% was on counseling.  This document serves as a record of services personally performed by Truitt Merle, MD. It was created on her behalf by Theresia Bough, a trained medical scribe. The creation of this record is based on the scribe's personal observations and the provider's statements to them.   I have reviewed the above documentation for accuracy and completeness, and I agree with the above.   Truitt Merle  08/30/2017 11:26 AM

## 2017-08-30 ENCOUNTER — Inpatient Hospital Stay (HOSPITAL_BASED_OUTPATIENT_CLINIC_OR_DEPARTMENT_OTHER): Payer: Federal, State, Local not specified - PPO | Admitting: Hematology

## 2017-08-30 ENCOUNTER — Other Ambulatory Visit: Payer: Self-pay | Admitting: Hematology

## 2017-08-30 ENCOUNTER — Inpatient Hospital Stay: Payer: Federal, State, Local not specified - PPO | Attending: Hematology

## 2017-08-30 ENCOUNTER — Encounter: Payer: Self-pay | Admitting: Hematology

## 2017-08-30 ENCOUNTER — Telehealth: Payer: Self-pay | Admitting: Hematology

## 2017-08-30 VITALS — BP 139/68 | HR 61 | Temp 97.9°F | Resp 18 | Ht 67.0 in | Wt 209.1 lb

## 2017-08-30 DIAGNOSIS — C186 Malignant neoplasm of descending colon: Secondary | ICD-10-CM

## 2017-08-30 DIAGNOSIS — E669 Obesity, unspecified: Secondary | ICD-10-CM

## 2017-08-30 DIAGNOSIS — C19 Malignant neoplasm of rectosigmoid junction: Secondary | ICD-10-CM | POA: Insufficient documentation

## 2017-08-30 DIAGNOSIS — C20 Malignant neoplasm of rectum: Secondary | ICD-10-CM

## 2017-08-30 DIAGNOSIS — G620 Drug-induced polyneuropathy: Secondary | ICD-10-CM

## 2017-08-30 LAB — CBC WITH DIFFERENTIAL/PLATELET
BASOS ABS: 0.1 10*3/uL (ref 0.0–0.1)
Basophils Relative: 1 %
EOS ABS: 0.2 10*3/uL (ref 0.0–0.5)
EOS PCT: 3 %
HCT: 40.3 % (ref 34.8–46.6)
Hemoglobin: 13.3 g/dL (ref 11.6–15.9)
Lymphocytes Relative: 27 %
Lymphs Abs: 1.5 10*3/uL (ref 0.9–3.3)
MCH: 29.6 pg (ref 25.1–34.0)
MCHC: 33 g/dL (ref 31.5–36.0)
MCV: 89.8 fL (ref 79.5–101.0)
MONO ABS: 0.5 10*3/uL (ref 0.1–0.9)
Monocytes Relative: 9 %
Neutro Abs: 3.5 10*3/uL (ref 1.5–6.5)
Neutrophils Relative %: 60 %
PLATELETS: 174 10*3/uL (ref 145–400)
RBC: 4.49 MIL/uL (ref 3.70–5.45)
RDW: 14.7 % — AB (ref 11.2–14.5)
WBC: 5.7 10*3/uL (ref 3.9–10.3)

## 2017-08-30 LAB — COMPREHENSIVE METABOLIC PANEL
ALT: 15 U/L (ref 0–55)
AST: 17 U/L (ref 5–34)
Albumin: 3.7 g/dL (ref 3.5–5.0)
Alkaline Phosphatase: 117 U/L (ref 40–150)
Anion gap: 10 (ref 3–11)
BUN: 17 mg/dL (ref 7–26)
CHLORIDE: 108 mmol/L (ref 98–109)
CO2: 22 mmol/L (ref 22–29)
CREATININE: 0.69 mg/dL (ref 0.60–1.10)
Calcium: 9.4 mg/dL (ref 8.4–10.4)
Glucose, Bld: 107 mg/dL (ref 70–140)
POTASSIUM: 4.5 mmol/L (ref 3.5–5.1)
SODIUM: 140 mmol/L (ref 136–145)
Total Bilirubin: 0.4 mg/dL (ref 0.2–1.2)
Total Protein: 6.3 g/dL — ABNORMAL LOW (ref 6.4–8.3)

## 2017-08-30 LAB — CEA (IN HOUSE-CHCC): CEA (CHCC-IN HOUSE): 1.72 ng/mL (ref 0.00–5.00)

## 2017-08-30 NOTE — Telephone Encounter (Signed)
Scheduled appt per 4/24 los - Gave pt  AVS and calender per los.

## 2017-08-31 ENCOUNTER — Telehealth: Payer: Self-pay

## 2017-08-31 NOTE — Telephone Encounter (Signed)
Notified patient per Dr. Burr Medico that her blood work is normal, patient verbalized an understanding.

## 2017-08-31 NOTE — Telephone Encounter (Signed)
-----   Message from Truitt Merle, MD sent at 08/30/2017  9:39 PM EDT ----- Please let pt know her CEA is normal, thanks   Truitt Merle  08/30/2017

## 2017-08-31 NOTE — Progress Notes (Signed)
Notified patient per Dr. Burr Medico that her blood work is normal.

## 2017-10-09 ENCOUNTER — Encounter: Payer: Self-pay | Admitting: Hematology

## 2017-10-10 DIAGNOSIS — H353131 Nonexudative age-related macular degeneration, bilateral, early dry stage: Secondary | ICD-10-CM | POA: Diagnosis not present

## 2017-10-10 DIAGNOSIS — H52223 Regular astigmatism, bilateral: Secondary | ICD-10-CM | POA: Diagnosis not present

## 2017-10-10 DIAGNOSIS — H5203 Hypermetropia, bilateral: Secondary | ICD-10-CM | POA: Diagnosis not present

## 2017-10-10 DIAGNOSIS — H40023 Open angle with borderline findings, high risk, bilateral: Secondary | ICD-10-CM | POA: Diagnosis not present

## 2017-10-10 DIAGNOSIS — H2513 Age-related nuclear cataract, bilateral: Secondary | ICD-10-CM | POA: Diagnosis not present

## 2017-10-10 DIAGNOSIS — H04123 Dry eye syndrome of bilateral lacrimal glands: Secondary | ICD-10-CM | POA: Diagnosis not present

## 2017-10-10 DIAGNOSIS — H524 Presbyopia: Secondary | ICD-10-CM | POA: Diagnosis not present

## 2017-12-27 ENCOUNTER — Inpatient Hospital Stay: Payer: Federal, State, Local not specified - PPO | Attending: Hematology

## 2017-12-27 ENCOUNTER — Encounter (HOSPITAL_COMMUNITY): Payer: Self-pay

## 2017-12-27 ENCOUNTER — Other Ambulatory Visit: Payer: Federal, State, Local not specified - PPO

## 2017-12-27 ENCOUNTER — Ambulatory Visit (HOSPITAL_COMMUNITY)
Admission: RE | Admit: 2017-12-27 | Discharge: 2017-12-27 | Disposition: A | Discharge status: Home / Self Care | Payer: Federal, State, Local not specified - PPO | Source: Ambulatory Visit | Attending: Hematology | Admitting: Hematology

## 2017-12-27 DIAGNOSIS — E663 Overweight: Secondary | ICD-10-CM | POA: Insufficient documentation

## 2017-12-27 DIAGNOSIS — C20 Malignant neoplasm of rectum: Secondary | ICD-10-CM

## 2017-12-27 DIAGNOSIS — C186 Malignant neoplasm of descending colon: Secondary | ICD-10-CM

## 2017-12-27 DIAGNOSIS — C19 Malignant neoplasm of rectosigmoid junction: Secondary | ICD-10-CM

## 2017-12-27 DIAGNOSIS — G620 Drug-induced polyneuropathy: Secondary | ICD-10-CM | POA: Insufficient documentation

## 2017-12-27 DIAGNOSIS — D1803 Hemangioma of intra-abdominal structures: Secondary | ICD-10-CM | POA: Diagnosis not present

## 2017-12-27 DIAGNOSIS — K432 Incisional hernia without obstruction or gangrene: Secondary | ICD-10-CM | POA: Insufficient documentation

## 2017-12-27 DIAGNOSIS — M7989 Other specified soft tissue disorders: Secondary | ICD-10-CM | POA: Insufficient documentation

## 2017-12-27 DIAGNOSIS — I700 Atherosclerosis of aorta: Secondary | ICD-10-CM | POA: Diagnosis not present

## 2017-12-27 DIAGNOSIS — R5383 Other fatigue: Secondary | ICD-10-CM | POA: Diagnosis not present

## 2017-12-27 DIAGNOSIS — C779 Secondary and unspecified malignant neoplasm of lymph node, unspecified: Secondary | ICD-10-CM | POA: Insufficient documentation

## 2017-12-27 LAB — CBC WITH DIFFERENTIAL/PLATELET
Basophils Absolute: 0.1 10*3/uL (ref 0.0–0.1)
Basophils Relative: 1 %
EOS ABS: 0.1 10*3/uL (ref 0.0–0.5)
EOS PCT: 2 %
HCT: 41 % (ref 34.8–46.6)
Hemoglobin: 13.6 g/dL (ref 11.6–15.9)
LYMPHS ABS: 1.3 10*3/uL (ref 0.9–3.3)
LYMPHS PCT: 25 %
MCH: 29.2 pg (ref 25.1–34.0)
MCHC: 33.2 g/dL (ref 31.5–36.0)
MCV: 88 fL (ref 79.5–101.0)
Monocytes Absolute: 0.4 10*3/uL (ref 0.1–0.9)
Monocytes Relative: 8 %
NEUTROS PCT: 64 %
Neutro Abs: 3.5 10*3/uL (ref 1.5–6.5)
PLATELETS: 170 10*3/uL (ref 145–400)
RBC: 4.66 MIL/uL (ref 3.70–5.45)
RDW: 14.2 % (ref 11.2–14.5)
WBC: 5.5 10*3/uL (ref 3.9–10.3)

## 2017-12-27 LAB — COMPREHENSIVE METABOLIC PANEL
ALT: 19 U/L (ref 0–44)
AST: 20 U/L (ref 15–41)
Albumin: 4 g/dL (ref 3.5–5.0)
Alkaline Phosphatase: 162 U/L — ABNORMAL HIGH (ref 38–126)
Anion gap: 7 (ref 5–15)
BUN: 14 mg/dL (ref 8–23)
CALCIUM: 9.4 mg/dL (ref 8.9–10.3)
CHLORIDE: 105 mmol/L (ref 98–111)
CO2: 29 mmol/L (ref 22–32)
CREATININE: 0.7 mg/dL (ref 0.44–1.00)
GFR calc non Af Amer: >60 mL/min (ref >60)
Glucose, Bld: 101 mg/dL — ABNORMAL HIGH (ref 70–99)
Potassium: 4.2 mmol/L (ref 3.5–5.1)
SODIUM: 141 mmol/L (ref 135–145)
Total Bilirubin: 0.4 mg/dL (ref 0.3–1.2)
Total Protein: 6.6 g/dL (ref 6.5–8.1)

## 2017-12-27 LAB — CEA (IN HOUSE-CHCC): CEA (CHCC-In House): 1.87 ng/mL (ref 0.00–5.00)

## 2017-12-27 MED ORDER — IOHEXOL 300 MG/ML  SOLN
100.0000 mL | Freq: Once | INTRAMUSCULAR | Status: AC | PRN
  Administered 2017-12-27: 100 mL via INTRAVENOUS

## 2017-12-28 NOTE — Progress Notes (Signed)
[place]  [phone] [phone]  Clinic Follow up Note   Patient Care Team: [name], MD as PCP - General (Family Medicine) Jovita Kussmaul, MD as Consulting Physician (General Surgery)   Date of Service:  12/29/2017   CHIEF COMPLAINTS:  F/u stage III colon cancer   Oncology History   Rectal cancer [facility])   Staging form: Colon and Rectum, AJCC 7th Edition   - Pathologic stage from 01/28/2016: Stage IIIC (T3, N2b, cM0) - Signed by Truitt Merle, MD on 02/12/2016      Cancer of rectosigmoid (colon) ([place])   01/04/2016 Procedure    Colonoscopy by Dr. Estanislado Emms showed a partially obstructing tumor in the rectal sigmoid colon, biopsied.    01/05/2016 Imaging    CT abdomen and pelvis with contrast showed no dominant sigmoid colon mass, soft tissue fullness at the rectosigmoid junction, suspicious for the primary lesion. Borderline enlarged nodes in the sigmoid mesocolon and at the origin of the inferior mesenteric artery are indeterminate no evidence of December metastasis. Left hepatic lobe giant hemangioma.     01/28/2016 Initial Diagnosis    Rectal cancer ([place])    01/28/2016 Surgery    Endoscopic low anterior resection of rectosigmoid segmental colon for tumor     01/28/2016 Pathology Results    Invasive colonic adenocarcinoma, 4.5 cm extending into her chronic connective tissue, lymphovascular involvement by tumor, margins were negative, metastatic carcinoma in 9 of 21 lymph nodes.    02/24/2016 Imaging    CT chest w/o contrast IMPRESSION: 1. No evidence of pulmonary metastasis. 2. Stable large hepatic hemangioma.    03/08/2016 - 08/10/2016 Adjuvant Chemotherapy    Adjuvant chemotherapy with CAPOX (Xeloda 2000 mg twice daily, on day 1-14, oxaliplatin 150m/m2 on day 1) every 3 weeks,  Oxaliplatin held after 4 cycles and she had additional 4 cycles of Xeloda.     09/14/2016 Imaging    CT CAP W Contrast IMPRESSION: No evidence of locally  recurrent or metastatic carcinoma. Small periumbilical hernia containing small bowel. Stable 5 cm benign hemangioma in left hepatic lobe. Stable small calcified uterine fibroid. Colonic diverticulosis. No radiographic evidence of diverticulitis.    09/14/2016 Imaging    CT CAP w contrast  IMPRESSION: No evidence of locally recurrent or metastatic carcinoma. Small periumbilical hernia containing small bowel. Stable 5 cm benign hemangioma in left hepatic lobe. Stable small calcified uterine fibroid. Colonic diverticulosis. No radiographic evidence of diverticulitis.    04/10/2017 Imaging    CT CAP W Contrast 04/10/17 IMPRESSION: 1. No findings of recurrent malignancy. The hepatic lesions have benign imaging characteristics. 2. Interval ventral hernia repair without complicating feature. 3. Other imaging findings of potential clinical significance: Aortic Atherosclerosis (ICD10-I70.0). Mild cardiomegaly. Stable scarring in both lungs. Sigmoid diverticulosis. Prominent stool throughout the colon favors constipation. Calcified anterior subserosal fibroid along the uterus.    12/27/2017 Imaging    CT CAP W Contrast 12/27/17 IMPRESSION: 1. Recurrent ventral hernia along the inferior aspect of the midline incision containing only fat. 2. Stable negative chest CT.  No acute abdominopelvic findings. 3. No evidence of metastatic disease post partial colectomy. 4. Stable hepatic hemangioma and cysts. Coronary and Aortic Atherosclerosis (ICD10-I70.0).     HISTORY OF PRESENTING ILLNESS (05/14/2015):  GHurshel Party67y.o. female is here to discussed her risk of breast cancer.   She has had palpable left breast mass for over 20 years, unchanged. Her mammogram in 2014 showed a 0.8 cm cyst. She does screening  mammogram once a year. The lesion looks slightly more suspicious on the mammogram in August 2016, and a biopsy was recommended, which showed sclerosing lesion with typical ductal hyperplasia.  She was referred to see Dr. Marlou Starks and underwent left breast lumpectomy. She tolerated the surgery very well, and has recovered completely.  She feels well, has mild fatigue, she takes care two of her brothers, who live alone and have health issues. She lives with her husband and 72 yo son. She remains physically active, does not exercise regularly. She has mild arthritis, takes ibuprofen and Aleve as needed, no other complaints.  GYN HISTORY  Menarchal: 10 LMP: 3 Contraceptive: 24 years  HRT: 5 years, stopped at age of 67  G1P1: one son, she was 67 yo when she had her son   CURRENT THERAPY: Surveillance  INTERIM HISTORY EARLEAN FIDALGO returns for follow-up for her colon cancer. She presents to the clinic today by herself. She notes she has low energy and fatigue but denies any new pain. She denies being depressed.  She notes her urine has a different smell but denies dysuria or concerns for UTI. She notes arthritis in her feet and has b/l ankle swelling. 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  She felt her stomach protruding lately and understands it is her hernia. She does not highly interested in another hernia repair currently.     MEDICAL HISTORY:  Past Medical History:  Diagnosis Date  . Arthritis    "mainly in my back, feet, hips" (03/09/2017)  . Cancer of sigmoid colon ([place]) 2017  . Cataract, immature   . GERD (gastroesophageal reflux disease)   . Heart palpitations   . History of blood transfusion 2002   "related to endometrosis"  . 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 Seasonal allergies   . Wears partial dentures    upper front - 1 tooth    SURGICAL HISTORY: Past Surgical History:  Procedure Laterality Date  . APPENDECTOMY    . BREAST BIOPSY Left 2016  . BREAST LUMPECTOMY Left 03/2015  . BREAST LUMPECTOMY WITH RADIOACTIVE SEED LOCALIZATION Left 03/13/2015   Procedure: BREAST LUMPECTOMY WITH RADIOACTIVE SEED LOCALIZATION;  Surgeon: Autumn Messing III, MD;  Location: [place];  Service: General;  Laterality: Left;  . COLECTOMY    . COLONOSCOPY    . ENDOMETRIAL FULGURATION  06/13/2000  . HERNIA REPAIR    . INGUINAL HERNIA REPAIR  03/05/2012   Procedure: HERNIA REPAIR INGUINAL INCARCERATED;  Surgeon: Ralene Ok, MD;  Location: [place];  Service: General;  Laterality: left;  Marland Kitchen INGUINAL HERNIA REPAIR Left   . INSERTION OF MESH  03/05/2012   Procedure: INSERTION OF MESH;  Surgeon: Ralene Ok, MD;  Location: [place];  Service: General;  Laterality: Left;  . INSERTION OF MESH N/A 03/09/2017   Procedure: INSERTION OF MESH;  Surgeon: Jovita Kussmaul, MD;  Location: [place];  Service: General;  Laterality: N/A;  . LAPAROSCOPIC ASSISTED VENTRAL HERNIA REPAIR  03/09/2017   w/mesh  . LAPAROSCOPIC SIGMOID COLECTOMY Bilateral 01/28/2016   Procedure: LAPAROSCOPIC ASSISTED SIGMOID COLECTOMY;  Surgeon: Autumn Messing III, MD;  Location: [place];  Service: General;  Laterality: Bilateral;  . MYOMECTOMY  06/13/2000  . PORT-A-CATH REMOVAL Right 03/09/2017  . PORT-A-CATH REMOVAL N/A 03/09/2017   Procedure: REMOVAL PORT-A-CATH RIGHT CHEST;  Surgeon: Jovita Kussmaul, MD;  Location: [place];  Service: General;  Laterality: N/A;  . PORTACATH PLACEMENT N/A 02/18/2016  Procedure: INSERTION PORT-A-CATH;  Surgeon: Autumn Messing III, MD;  Location: [place];  Service: General;  Laterality: N/A;  . SHOULDER ARTHROSCOPY W/ ROTATOR CUFF REPAIR Left 2013  . VENTRAL HERNIA REPAIR N/A 03/09/2017   Procedure: LAPAROSCOPIC VENTRAL HERNIA REPAIR;  Surgeon: Jovita Kussmaul, MD;  Location: [place];  Service: General;  Laterality: N/A;    SOCIAL HISTORY: Social History   Socioeconomic History  . Marital status: Married    Spouse name: Not on file  . Number of children: Not on file  . Years of education: Not on file  . Highest education level: Not on file  Occupational History  . Not on file  Social Needs  . Financial resource strain: Not on file   . Food insecurity:    Worry: Not on file    Inability: Not on file  . Transportation needs:    Medical: Not on file    Non-medical: Not on file  Tobacco Use  . Smoking status: Never Smoker  . Smokeless tobacco: Never Used  Substance and Sexual Activity  . Alcohol use: Yes    Alcohol/week: 7.0 standard drinks    Types: 7 Glasses of wine per week  . Drug use: No  . Sexual activity: Not Currently  Lifestyle  . Physical activity:    Days per week: Not on file    Minutes per session: Not on file  . Stress: Not on file  Relationships  . Social connections:    Talks on phone: Not on file    Gets together: Not on file    Attends religious service: Not on file    Active member of club or organization: Not on file    Attends meetings of clubs or organizations: Not on file    Relationship status: Not on file  . Intimate partner violence:    Fear of current or ex partner: Not on file    Emotionally abused: Not on file    Physically abused: Not on file    Forced sexual activity: Not on file  Other Topics Concern  . Not on file  Social History Narrative  . Not on file    FAMILY HISTORY: Family History  Problem Relation Age of Onset  . Cancer Brother 4       prostate cancer   . Cancer Paternal Aunt 10       breast cancer   . Cancer Cousin 40       ovarian and breast cancer (maternal cousin)     ALLERGIES:  is allergic to no known allergies.  MEDICATIONS:  Current Outpatient Medications  Medication Sig Dispense Refill  . clobetasol cream (TEMOVATE) 2.11 % Apply 1 application topically 2 (two) times daily as needed (RASH).     . famotidine (PEPCID) 20 MG tablet Take 20 mg by mouth daily as needed for heartburn or indigestion.   0  . ibuprofen (ADVIL,MOTRIN) 200 MG tablet Take 400 mg by mouth 2 (two) times daily as needed for mild pain.    . Multiple Vitamins-Minerals (PRESERVISION AREDS 2+MULTI VIT PO) Take by mouth.    Vladimir Faster Glycol-Propyl Glycol (SYSTANE OP) Apply  1 drop to eye 2 (two) times daily.    . Vitamin D, Ergocalciferol, (DRISDOL) 50000 units CAPS capsule Take 1.25 Units by mouth once a week.     No current facility-administered medications for this visit.    Facility-Administered Medications Ordered in Other Visits  Medication Dose Route Frequency Provider Last Rate Last  Dose  . sodium chloride flush (NS) 0.9 % injection 10 mL  10 mL Intravenous PRN Truitt Merle, MD   10 mL at 05/31/16 1303    REVIEW OF SYSTEMS:  Constitutional: Denies fevers, chills or abnormal night sweats  Eyes: Denies blurriness of vision, double vision or watery eyes Ears, nose, mouth, throat, and face: Denies mucositis or sore throat Respiratory: Denies cough, dyspnea or wheezes Cardiovascular: Denies palpitation, chest discomfort Gastrointestinal: (+) b/l ankle swelling  GI: (+) slight abdominal hernia  Skin: Denies abnormal skin rashes.  MSK: (+) Arthritis in feet  Lymphatics: Denies new lymphadenopathy or easy bruising Neurological: (+) Neuropathy in bottom of toes Behavioral/Psych: Mood is stable, no new changes  All other systems were reviewed with the patient and are negative.  PHYSICAL EXAMINATION: ECOG PERFORMANCE STATUS: 0 BP 130/65 (BP Location: Left Arm, Patient Position: Sitting)   Pulse 68   Temp 98.1 F (36.7 C) (Oral)   Resp 17   Ht _0  (1.702 m)   Wt 205 lb 11.2 oz (93.3 kg)   SpO2 97%   BMI 32.22 kg/m   GENERAL:alert, no distress and comfortable  SKIN: skin color, texture, turgor are normal, no rashes or significant lesions EYES: normal, conjunctiva are pink and non-injected, sclera clear OROPHARYNX:no exudate, no erythema and lips, buccal mucosa, and tongue normal  NECK: supple, thyroid normal size, non-tender, without nodularity LYMPH:  no palpable lymphadenopathy in the cervical, axillary or inguinal LUNGS: clear to auscultation and percussion with normal breathing effort HEART: regular rate & rhythm and no murmurs (+) mild lower  extremity edema of b/l ankles ABDOMEN: abdomen soft, non-tender and normal bowel sounds, midline incision has healed well, no discharge or skin erythema. (+) slight abdominal protrusion from abdominal hernia Musculoskeletal:no cyanosis of digits and no clubbing.  PSYCH: alert & oriented x 3 with fluent speech NEURO: no focal motor/sensory deficits  LABORATORY DATA:  I have reviewed the data as listed CBC Latest Ref Rng & Units 12/27/2017 08/30/2017 04/10/2017  WBC 3.9 - 10.3 K/uL 5.5 5.7 6.1  Hemoglobin 11.6 - 15.9 g/dL 13.6 13.3 13.6  Hematocrit 34.8 - 46.6 % 41.0 40.3 41.1  Platelets 145 - 400 K/uL 170 174 155   CMP Latest Ref Rng & Units 12/27/2017 08/30/2017 04/10/2017  Glucose 70 - 99 mg/dL 101(H) 107 95  BUN 8 - 23 mg/dL 14 17 14.8  Creatinine 0.44 - 1.00 mg/dL 0.70 0.69 0.7  Sodium 135 - 145 mmol/L 141 140 140  Potassium 3.5 - 5.1 mmol/L 4.2 4.5 4.2  Chloride 98 - 111 mmol/L 105 108 -  CO2 22 - 32 mmol/L _1 Calcium 8.9 - 10.3 mg/dL 9.4 9.4 9.8  Total Protein 6.5 - 8.1 g/dL 6.6 6.3(L) 6.8  Total Bilirubin 0.3 - 1.2 mg/dL 0.4 0.4 0.45  Alkaline Phos 38 - 126 U/L 162(H) 117 164(H)  AST 15 - 41 U/L _2 ALT 0 - 44 U/L _3 Results for YAILEN, ZEMAITIS (MRN [id number]) as of 12/28/2017 17:35  Ref. Range 12/29/2016 11:30 04/10/2017 10:17 08/30/2017 09:59 12/27/2017 10:59  CEA ([facility]) Latest Ref Range: 0.00 - 5.00 ng/mL 2.53 2.17 1.72 1.87     PATHOLOGY REPORT  Diagnosis 01/04/2016 Colon, biopsy, rectosigmoid junction - ADENOCARCINOMA, SEE COMMENT.  Diagnosis 01/28/2016 1. Soft tissue, biopsy, Implant on sigmoid colon - BENIGN CALCIFIED NODULE. - NO EVIDENCE OF MALIGNANCY. 2. Colon, segmental resection for tumor, Recto-sigmoid - INVASIVE COLONIC ADENOCARCINOMA, 4.5 CM  EXTENDING INTO PERICOLONIC CONNECTIVE TISSUE. - LYMPHATIC VASCULAR INVOLVEMENT BY TUMOR. - MARGINS NOT INVOLVED. - METASTATIC CARCINOMA IN 9 OF 21 LYMPH NODES (9/21). - SEE ONCOLOGY TABLE  BELOW. 3. Colon, resection margin (donut), Proximal and distal of sigmoid - BENIGN PROXIMAL AND DISTAL ANASTOMATIC RINGS. - NO EVIDENCE OF MALIGNANCY. Specimen Gross and Clinical Information  RADIOGRAPHIC STUDIES: I have personally reviewed the radiological images as listed and agreed with the findings in the report. Ct Chest W Contrast  Result Date: 12/27/2017 CLINICAL DATA:  Colon cancer diagnosed in September 2017. History partial colon resection with chemotherapy completion 18 months ago. Right lower abdominal pain for 2 months. EXAM: CT CHEST, ABDOMEN, AND PELVIS WITH CONTRAST TECHNIQUE: Multidetector CT imaging of the chest, abdomen and pelvis was performed following the standard protocol during bolus administration of intravenous contrast. CONTRAST:  143m OMNIPAQUE IOHEXOL 300 MG/ML  SOLN COMPARISON:  CT 04/10/2017 and 09/14/2016. FINDINGS: CT CHEST FINDINGS Cardiovascular: Prominent coronary artery atherosclerosis with lesser involvement of the aorta and great vessels. No acute vascular findings. The heart size is normal. There is no pericardial effusion. Mediastinum/Nodes: There are no enlarged mediastinal, hilar or axillary lymph nodes. The thyroid gland, trachea and esophagus demonstrate no significant findings. Lungs/Pleura: There is no pleural effusion. There are linear opacities at both lung bases, similar to previous studies and most likely scarring. No suspicious pulmonary nodules. Musculoskeletal/Chest wall: No chest wall mass or suspicious osseous findings. There are mild degenerative changes in the thoracic spine and postsurgical changes in the left humeral head. CT ABDOMEN AND PELVIS FINDINGS Hepatobiliary: The liver appears unchanged. There is a hemangioma in the left lobe measuring 5.2 x 4.6 cm on image 47/2. Probable small cysts on images 41 and 46 are stable. No new or enlarging hepatic lesions. No evidence of gallstones, gallbladder wall thickening or biliary dilatation. Pancreas:  Mildly atrophied but stable. No evidence of pancreatic mass, surrounding inflammation or ductal dilatation. Spleen: Normal in size without focal abnormality. Adrenals/Urinary Tract: Both adrenal glands appear normal. Stable 13 mm cyst in the lower pole of the right kidney on image 74/2. Probable tiny left renal cyst on image 65/2. No evidence of renal mass, urinary tract calculus or hydronephrosis. The bladder appears normal. Stomach/Bowel: No evidence of bowel wall thickening, distention or surrounding inflammatory change. The rectal anastomosis appears stable. There are diverticular changes throughout the descending and sigmoid colon. Moderate stool is present throughout the colon. There is a probable small appendiceal remnant. Vascular/Lymphatic: There are no enlarged abdominal or pelvic lymph nodes. Aortic and branch vessel atherosclerosis. Reproductive: Cervical nabothian cyst and exophytic calcified uterine fibroid are stable. There is no adnexal mass. Other: Postsurgical changes in the anterior abdominal wall. The patient has developed an incisional hernia along the inferior aspect of the incision, containing only fat. This measures up to 5.2 cm transverse on image 101/2. Musculoskeletal: No acute or significant osseous findings. Lower lumbar spondylosis noted. IMPRESSION: 1. Recurrent ventral hernia along the inferior aspect of the midline incision containing only fat. 2. Stable negative chest CT.  No acute abdominopelvic findings. 3. No evidence of metastatic disease post partial colectomy. 4. Stable hepatic hemangioma and cysts. Coronary and Aortic Atherosclerosis (ICD10-I70.0). Electronically Signed   By: WRichardean SaleM.D.   On: 12/27/2017 16:25   Ct Abdomen Pelvis W Contrast  Result Date: 12/27/2017 CLINICAL DATA:  Colon cancer diagnosed in September 2017. History partial colon resection with chemotherapy completion 18 months ago. Right lower abdominal pain for 2 months. EXAM: CT CHEST,  ABDOMEN,  AND PELVIS WITH CONTRAST TECHNIQUE: Multidetector CT imaging of the chest, abdomen and pelvis was performed following the standard protocol during bolus administration of intravenous contrast. CONTRAST:  129m OMNIPAQUE IOHEXOL 300 MG/ML  SOLN COMPARISON:  CT 04/10/2017 and 09/14/2016. FINDINGS: CT CHEST FINDINGS Cardiovascular: Prominent coronary artery atherosclerosis with lesser involvement of the aorta and great vessels. No acute vascular findings. The heart size is normal. There is no pericardial effusion. Mediastinum/Nodes: There are no enlarged mediastinal, hilar or axillary lymph nodes. The thyroid gland, trachea and esophagus demonstrate no significant findings. Lungs/Pleura: There is no pleural effusion. There are linear opacities at both lung bases, similar to previous studies and most likely scarring. No suspicious pulmonary nodules. Musculoskeletal/Chest wall: No chest wall mass or suspicious osseous findings. There are mild degenerative changes in the thoracic spine and postsurgical changes in the left humeral head. CT ABDOMEN AND PELVIS FINDINGS Hepatobiliary: The liver appears unchanged. There is a hemangioma in the left lobe measuring 5.2 x 4.6 cm on image 47/2. Probable small cysts on images 41 and 46 are stable. No new or enlarging hepatic lesions. No evidence of gallstones, gallbladder wall thickening or biliary dilatation. Pancreas: Mildly atrophied but stable. No evidence of pancreatic mass, surrounding inflammation or ductal dilatation. Spleen: Normal in size without focal abnormality. Adrenals/Urinary Tract: Both adrenal glands appear normal. Stable 13 mm cyst in the lower pole of the right kidney on image 74/2. Probable tiny left renal cyst on image 65/2. No evidence of renal mass, urinary tract calculus or hydronephrosis. The bladder appears normal. Stomach/Bowel: No evidence of bowel wall thickening, distention or surrounding inflammatory change. The rectal anastomosis appears stable.  There are diverticular changes throughout the descending and sigmoid colon. Moderate stool is present throughout the colon. There is a probable small appendiceal remnant. Vascular/Lymphatic: There are no enlarged abdominal or pelvic lymph nodes. Aortic and branch vessel atherosclerosis. Reproductive: Cervical nabothian cyst and exophytic calcified uterine fibroid are stable. There is no adnexal mass. Other: Postsurgical changes in the anterior abdominal wall. The patient has developed an incisional hernia along the inferior aspect of the incision, containing only fat. This measures up to 5.2 cm transverse on image 101/2. Musculoskeletal: No acute or significant osseous findings. Lower lumbar spondylosis noted. IMPRESSION: 1. Recurrent ventral hernia along the inferior aspect of the midline incision containing only fat. 2. Stable negative chest CT.  No acute abdominopelvic findings. 3. No evidence of metastatic disease post partial colectomy. 4. Stable hepatic hemangioma and cysts. Coronary and Aortic Atherosclerosis (ICD10-I70.0). Electronically Signed   By: WRichardean SaleM.D.   On: 12/27/2017 16:25     CT CAP W Contrast 12/27/17 IMPRESSION: 1. Recurrent ventral hernia along the inferior aspect of the midline incision containing only fat. 2. Stable negative chest CT.  No acute abdominopelvic findings. 3. No evidence of metastatic disease post partial colectomy. 4. Stable hepatic hemangioma and cysts. Coronary and Aortic Atherosclerosis (ICD10-I70.0).   CT CAP W Contrast 04/10/17 IMPRESSION: 1. No findings of recurrent malignancy. The hepatic lesions have benign imaging characteristics. 2. Interval ventral hernia repair without complicating feature. 3. Other imaging findings of potential clinical significance: Aortic Atherosclerosis (ICD10-I70.0). Mild cardiomegaly. Stable scarring in both lungs. Sigmoid diverticulosis. Prominent stool throughout the colon favors constipation. Calcified anterior  subserosal fibroid along the uterus.  CT abdomen and pelvis with contrast 01/05/2016 IMPRESSION: 1. No dominant sigmoid colon mass identified. There is underdistention and soft tissue fullness at the rectosigmoid junction, suspicious for the primary lesion. Borderline enlarged nodes  in the sigmoid mesocolon and at the origin of the inferior mesenteric artery are indeterminate. As these are in the drainage pathway for a sigmoid primary, localized nodal metastasis cannot be excluded. 2. No evidence of distant metastatic disease. 3.  Aortic atherosclerosis. 4. Uterine fibroid. 5. Left hepatic lobe giant hemangioma, as before.  CT chest 02/24/2016 IMPRESSION: 1. No evidence of pulmonary metastasis. 2. Stable large hepatic hemangioma.  Colonoscopy 01/04/2016 Dr. Watt Climes Findings: An ulcerated partially obstructing large mass was found in the rectosigmoid colon. The mass was partially circumferential, measured about 5 cm in length. Biopsy was taken. Multiple small milestone diverticula were found in the sigmoid colon. The exam otherwise was normal.  CT CAP w contrast 09/14/2016 IMPRESSION: No evidence of locally recurrent or metastatic carcinoma. Small periumbilical hernia containing small bowel. Stable 5 cm benign hemangioma in left hepatic lobe. Stable small calcified uterine fibroid. Colonic diverticulosis. No radiographic evidence of diverticulitis.  ASSESSMENT & PLAN:  67 y.o. Caucasian female, with recently diagnosed colorectal cancer   1. Colorectal cancer, invasive adenocarcinoma,pT3N2bM0, stage IIIC, MSI-stable  -Her primary cancer is located in the rectosigmoid colon. -I previously reviewed her staging scan findings with patient and her husband. -She had complete surgical resection in sept 2017 -Giving the multiple positive lymph nodes, and locally advanced disease, she is at high risk for recurrence. -She received adjuvant chemotherapy CAPOX for 8 cycles10/31/17-08/10/16,  oxaliplatin was held after cycle 4 due to poor tolerance. She has recovered well from adjuvant chemotherapy, with mild residual neuropathy which is improving well.  -I previously reviewed her surveillance CT scan from 09/14/2016 and 04/10/17, which showed no evidence of recurrence -Port removed during hernia repair surgery on 03/09/17 -I previously encouraged her to be active and eat a healthy balanced diet and try to loose some weight  -I previously discussed the surveillance plan. We will f/u every 6 months with yearly scans.  -We discussed her CT CAP from 12/27/17 which showed no evidence of recurrence or metastatic disease. Also shows abdominal hernia remains. -She is 2 years out since diagnosis. I discussed after the first 2 years her risk of recurrence has dropped significantly.   -She is clinically doing very well, lab reviewed and WNL, physical exam today was unremarkable, no clinical concern for recurrence. Continue colon cancer surveillance. We discussed what to watch at home -F/u in 6 months     2. 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 This is considered a benign breast lesion, not high risk for breast cancer. -Continue annual screening mammogram, self exam and routine follow-up.  3. 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 -neuropathy in her hands is resolved, she still has some numbness in bottom of toes, improving. No balance issues   4. Overweight, Fatigue -She previously gained 20 pounds over 9 months span, mostly related to diet and less physical activities.  I previously encouraged her to have healthy diet, and exercise.  -She notes she has lost a little weight since last visit and is working on eating better.    -She still has fatigue post treatment. I encouraged her to be active to help combat this.   Plan -Lab and scan reviewed, no evidence of recurrence.   -Lab and f/u in 6 months    All questions were answered. The patient  knows to call the clinic with any problems, questions or concerns.  I spent 20  minutes counseling the patient face to face. The total time spent in the appointment was 25  minutes and more than 50% was on counseling.  Oneal Deputy, am acting as scribe for Truitt Merle, MD.   I have reviewed the above documentation for accuracy and completeness, and I agree with the above.    Truitt Merle  12/29/2017 11:56 AM

## 2017-12-29 ENCOUNTER — Encounter: Payer: Self-pay | Admitting: Hematology

## 2017-12-29 ENCOUNTER — Inpatient Hospital Stay (HOSPITAL_BASED_OUTPATIENT_CLINIC_OR_DEPARTMENT_OTHER): Payer: Federal, State, Local not specified - PPO | Admitting: Hematology

## 2017-12-29 VITALS — BP 130/65 | HR 68 | Temp 98.1°F | Resp 17 | Ht 67.0 in | Wt 205.7 lb

## 2017-12-29 DIAGNOSIS — C779 Secondary and unspecified malignant neoplasm of lymph node, unspecified: Secondary | ICD-10-CM

## 2017-12-29 DIAGNOSIS — M7989 Other specified soft tissue disorders: Secondary | ICD-10-CM | POA: Diagnosis not present

## 2017-12-29 DIAGNOSIS — R5383 Other fatigue: Secondary | ICD-10-CM

## 2017-12-29 DIAGNOSIS — E663 Overweight: Secondary | ICD-10-CM | POA: Diagnosis not present

## 2017-12-29 DIAGNOSIS — G620 Drug-induced polyneuropathy: Secondary | ICD-10-CM | POA: Diagnosis not present

## 2017-12-29 DIAGNOSIS — C19 Malignant neoplasm of rectosigmoid junction: Secondary | ICD-10-CM | POA: Diagnosis not present

## 2018-01-01 ENCOUNTER — Telehealth: Payer: Self-pay

## 2018-01-01 NOTE — Telephone Encounter (Signed)
Left a detailed message concerning a upcoming appointment that was scheduler per 8/23 los. Mailed a letter with a calender enclosed of this appointment.

## 2018-01-16 DIAGNOSIS — Z6835 Body mass index (BMI) 35.0-35.9, adult: Secondary | ICD-10-CM | POA: Diagnosis not present

## 2018-01-16 DIAGNOSIS — Z1231 Encounter for screening mammogram for malignant neoplasm of breast: Secondary | ICD-10-CM | POA: Diagnosis not present

## 2018-01-16 DIAGNOSIS — Z01419 Encounter for gynecological examination (general) (routine) without abnormal findings: Secondary | ICD-10-CM | POA: Diagnosis not present

## 2018-01-19 DIAGNOSIS — Z23 Encounter for immunization: Secondary | ICD-10-CM | POA: Diagnosis not present

## 2018-01-19 DIAGNOSIS — E669 Obesity, unspecified: Secondary | ICD-10-CM | POA: Diagnosis not present

## 2018-01-19 DIAGNOSIS — H6993 Unspecified Eustachian tube disorder, bilateral: Secondary | ICD-10-CM | POA: Diagnosis not present

## 2018-01-19 DIAGNOSIS — J309 Allergic rhinitis, unspecified: Secondary | ICD-10-CM | POA: Diagnosis not present

## 2018-01-19 DIAGNOSIS — R42 Dizziness and giddiness: Secondary | ICD-10-CM | POA: Diagnosis not present

## 2018-02-01 DIAGNOSIS — H8110 Benign paroxysmal vertigo, unspecified ear: Secondary | ICD-10-CM | POA: Diagnosis not present

## 2018-02-01 DIAGNOSIS — R262 Difficulty in walking, not elsewhere classified: Secondary | ICD-10-CM | POA: Diagnosis not present

## 2018-02-09 DIAGNOSIS — H04123 Dry eye syndrome of bilateral lacrimal glands: Secondary | ICD-10-CM | POA: Diagnosis not present

## 2018-02-09 DIAGNOSIS — H353131 Nonexudative age-related macular degeneration, bilateral, early dry stage: Secondary | ICD-10-CM | POA: Diagnosis not present

## 2018-02-09 DIAGNOSIS — H40023 Open angle with borderline findings, high risk, bilateral: Secondary | ICD-10-CM | POA: Diagnosis not present

## 2018-02-09 DIAGNOSIS — H2513 Age-related nuclear cataract, bilateral: Secondary | ICD-10-CM | POA: Diagnosis not present

## 2018-06-12 DIAGNOSIS — H353131 Nonexudative age-related macular degeneration, bilateral, early dry stage: Secondary | ICD-10-CM | POA: Diagnosis not present

## 2018-06-12 DIAGNOSIS — H40023 Open angle with borderline findings, high risk, bilateral: Secondary | ICD-10-CM | POA: Diagnosis not present

## 2018-06-12 DIAGNOSIS — H04123 Dry eye syndrome of bilateral lacrimal glands: Secondary | ICD-10-CM | POA: Diagnosis not present

## 2018-06-12 DIAGNOSIS — H2513 Age-related nuclear cataract, bilateral: Secondary | ICD-10-CM | POA: Diagnosis not present

## 2018-06-27 NOTE — Progress Notes (Signed)
[place]   [phone] [phone]) [phone]   Clinic Follow up Note   Patient Care Team: [name], MD as PCP - General (Family Medicine) Jovita Kussmaul, MD as Consulting Physician (General Surgery)  Date of Service:  06/29/2018  CHIEF COMPLAINT: F/u stage III colon cancer   SUMMARY OF ONCOLOGIC HISTORY: Oncology History   Rectal cancer [facility])   Staging form: Colon and Rectum, AJCC 7th Edition   - Pathologic stage from 01/28/2016: Stage IIIC (T3, N2b, cM0) - Signed by Truitt Merle, MD on 02/12/2016      Cancer of rectosigmoid (colon) ([place])   01/03/2016 Initial Biopsy    Diagnosis Colon, biopsy, rectosigmoid junction - ADENOCARCINOMA, SEE COMMENT.    01/04/2016 Procedure    Colonoscopy by Dr. Estanislado Emms showed a partially obstructing tumor in the rectal sigmoid colon, biopsied.    01/05/2016 Imaging    CT abdomen and pelvis with contrast showed no dominant sigmoid colon mass, soft tissue fullness at the rectosigmoid junction, suspicious for the primary lesion. Borderline enlarged nodes in the sigmoid mesocolon and at the origin of the inferior mesenteric artery are indeterminate no evidence of December metastasis. Left hepatic lobe giant hemangioma.     01/28/2016 Initial Diagnosis    Rectal cancer ([place])    01/28/2016 Surgery    Endoscopic low anterior resection of rectosigmoid segmental colon for tumor     01/28/2016 Pathology Results    Invasive colonic adenocarcinoma, 4.5 cm extending into her chronic connective tissue, lymphovascular involvement by tumor, margins were negative, metastatic carcinoma in 9 of 21 lymph nodes.    02/24/2016 Imaging    CT chest w/o contrast IMPRESSION: 1. No evidence of pulmonary metastasis. 2. Stable large hepatic hemangioma.    03/08/2016 - 08/10/2016 Adjuvant Chemotherapy    Adjuvant chemotherapy with CAPOX (Xeloda 2000 mg twice daily, on day 1-14, oxaliplatin 118m/m2 on day 1) every 3 weeks,  Oxaliplatin held after 4  cycles and she had additional 4 cycles of Xeloda.     09/14/2016 Imaging    CT CAP W Contrast IMPRESSION: No evidence of locally recurrent or metastatic carcinoma. Small periumbilical hernia containing small bowel. Stable 5 cm benign hemangioma in left hepatic lobe. Stable small calcified uterine fibroid. Colonic diverticulosis. No radiographic evidence of diverticulitis.    09/14/2016 Imaging    CT CAP w contrast  IMPRESSION: No evidence of locally recurrent or metastatic carcinoma. Small periumbilical hernia containing small bowel. Stable 5 cm benign hemangioma in left hepatic lobe. Stable small calcified uterine fibroid. Colonic diverticulosis. No radiographic evidence of diverticulitis.    04/10/2017 Imaging    CT CAP W Contrast 04/10/17 IMPRESSION: 1. No findings of recurrent malignancy. The hepatic lesions have benign imaging characteristics. 2. Interval ventral hernia repair without complicating feature. 3. Other imaging findings of potential clinical significance: Aortic Atherosclerosis (ICD10-I70.0). Mild cardiomegaly. Stable scarring in both lungs. Sigmoid diverticulosis. Prominent stool throughout the colon favors constipation. Calcified anterior subserosal fibroid along the uterus.    12/27/2017 Imaging    CT CAP W Contrast 12/27/17 IMPRESSION: 1. Recurrent ventral hernia along the inferior aspect of the midline incision containing only fat. 2. Stable negative chest CT.  No acute abdominopelvic findings. 3. No evidence of metastatic disease post partial colectomy. 4. Stable hepatic hemangioma and cysts. Coronary and Aortic Atherosclerosis (ICD10-I70.0).      CURRENT THERAPY:  Surveillance  INTERVAL HISTORY:  Bethany ESHLEMANis here for a follow up of colon cancer. She was last seen be  me 6 months ago. She presents to the clinic today by herself. She note she has been doing well. She notes her Vertigo returned in 03/2018. She takes benadryl at night and was given  Valium.  She notes today has been the only time she knows her BP to be elevated. She does not have meter to check BP at home. She denies HA or chest pain.  She has not seen Dr. Marlou Starks in a while. She had a colonoscopy with Dr. Watt Climes after her surgery. She denies any Gi issues or blood in stool. She notes she increased in weight and would to lose weight. She has no other concerns.     REVIEW OF SYSTEMS:   Constitutional: Denies fevers, chills (+) Weight gain  Eyes: Denies blurriness of vision Ears, nose, mouth, throat, and face: Denies mucositis or sore throat (+) Vertigo  Respiratory: Denies cough, dyspnea or wheezes Cardiovascular: Denies palpitation, chest discomfort or lower extremity swelling Gastrointestinal:  Denies nausea, heartburn or change in bowel habits Skin: Denies abnormal skin rashes Lymphatics: Denies new lymphadenopathy or easy bruising Neurological:Denies numbness, tingling or new weaknesses Behavioral/Psych: Mood is stable, no new changes  All other systems were reviewed with the patient and are negative.  MEDICAL HISTORY:  Past Medical History:  Diagnosis Date  . Arthritis    "mainly in my back, feet, hips" (03/09/2017)  . Cancer of sigmoid colon ([place]) 2017  . Cataract, immature   . GERD (gastroesophageal reflux disease)   . Heart palpitations   . History of blood transfusion 2002   "related to endometrosis"  . 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 Seasonal allergies   . Wears partial dentures    upper front - 1 tooth    SURGICAL HISTORY: Past Surgical History:  Procedure Laterality Date  . APPENDECTOMY    . BREAST BIOPSY Left 2016  . BREAST LUMPECTOMY Left 03/2015  . BREAST LUMPECTOMY WITH RADIOACTIVE SEED LOCALIZATION Left 03/13/2015   Procedure: BREAST LUMPECTOMY WITH RADIOACTIVE SEED LOCALIZATION;  Surgeon: Autumn Messing III, MD;  Location: [place];  Service: General;  Laterality: Left;  . COLECTOMY    . COLONOSCOPY    .  ENDOMETRIAL FULGURATION  06/13/2000  . HERNIA REPAIR    . INGUINAL HERNIA REPAIR  03/05/2012   Procedure: HERNIA REPAIR INGUINAL INCARCERATED;  Surgeon: Ralene Ok, MD;  Location: [place];  Service: General;  Laterality: left;  Marland Kitchen INGUINAL HERNIA REPAIR Left   . INSERTION OF MESH  03/05/2012   Procedure: INSERTION OF MESH;  Surgeon: Ralene Ok, MD;  Location: [place];  Service: General;  Laterality: Left;  . INSERTION OF MESH N/A 03/09/2017   Procedure: INSERTION OF MESH;  Surgeon: Jovita Kussmaul, MD;  Location: [place];  Service: General;  Laterality: N/A;  . LAPAROSCOPIC ASSISTED VENTRAL HERNIA REPAIR  03/09/2017   w/mesh  . LAPAROSCOPIC SIGMOID COLECTOMY Bilateral 01/28/2016   Procedure: LAPAROSCOPIC ASSISTED SIGMOID COLECTOMY;  Surgeon: Autumn Messing III, MD;  Location: [place];  Service: General;  Laterality: Bilateral;  . MYOMECTOMY  06/13/2000  . PORT-A-CATH REMOVAL Right 03/09/2017  . PORT-A-CATH REMOVAL N/A 03/09/2017   Procedure: REMOVAL PORT-A-CATH RIGHT CHEST;  Surgeon: Jovita Kussmaul, MD;  Location: [place];  Service: General;  Laterality: N/A;  . PORTACATH PLACEMENT N/A 02/18/2016   Procedure: INSERTION PORT-A-CATH;  Surgeon: Autumn Messing III, MD;  Location: [place];  Service: General;  Laterality: N/A;  . SHOULDER ARTHROSCOPY W/ ROTATOR CUFF REPAIR Left 2013  .  VENTRAL HERNIA REPAIR N/A 03/09/2017   Procedure: LAPAROSCOPIC VENTRAL HERNIA REPAIR;  Surgeon: Jovita Kussmaul, MD;  Location: [place];  Service: General;  Laterality: N/A;    I have reviewed the social history and family history with the patient and they are unchanged from previous note.  ALLERGIES:  is allergic to no known allergies.  MEDICATIONS:  Current Outpatient Medications  Medication Sig Dispense Refill  . clobetasol cream (TEMOVATE) 7.09 % Apply 1 application topically 2 (two) times daily as needed (RASH).     . famotidine (PEPCID) 20 MG tablet Take 20 mg by mouth daily as needed for heartburn or  indigestion.   0  . ibuprofen (ADVIL,MOTRIN) 200 MG tablet Take 400 mg by mouth 2 (two) times daily as needed for mild pain.    . Multiple Vitamins-Minerals (PRESERVISION AREDS 2+MULTI VIT PO) Take by mouth.    Vladimir Faster Glycol-Propyl Glycol (SYSTANE OP) Apply 1 drop to eye 2 (two) times daily.    . Vitamin D, Ergocalciferol, (DRISDOL) 50000 units CAPS capsule Take 1.25 Units by mouth once a week.     No current facility-administered medications for this visit.    Facility-Administered Medications Ordered in Other Visits  Medication Dose Route Frequency Provider Last Rate Last Dose  . sodium chloride flush (NS) 0.9 % injection 10 mL  10 mL Intravenous PRN Truitt Merle, MD   10 mL at 05/31/16 1303    PHYSICAL EXAMINATION: ECOG PERFORMANCE STATUS: 1 - Symptomatic but completely ambulatory  Vitals:   06/29/18 1316  BP: (!) 148/89  Pulse: 75  Resp: 19  Temp: 97.8 F (36.6 C)  SpO2: 98%   Filed Weights   06/29/18 1316  Weight: 212 lb 11.2 oz (96.5 kg)    GENERAL:alert, no distress and comfortable SKIN: skin color, texture, turgor are normal, no rashes or significant lesions EYES: normal, Conjunctiva are pink and non-injected, sclera clear OROPHARYNX:no exudate, no erythema and lips, buccal mucosa, and tongue normal  NECK: supple, thyroid normal size, non-tender, without nodularity LYMPH:  no palpable lymphadenopathy in the cervical, axillary or inguinal LUNGS: clear to auscultation and percussion with normal breathing effort HEART: regular rate & rhythm and no murmurs and no lower extremity edema ABDOMEN:abdomen soft, non-tender and normal bowel sounds Musculoskeletal:no cyanosis of digits and no clubbing  NEURO: alert & oriented x 3 with fluent speech, no focal motor/sensory deficits  LABORATORY DATA:  I have reviewed the data as listed CBC Latest Ref Rng & Units 06/29/2018 12/27/2017 08/30/2017  WBC 4.0 - 10.5 K/uL 6.5 5.5 5.7  Hemoglobin 12.0 - 15.0 g/dL 14.8 13.6 13.3    Hematocrit 36.0 - 46.0 % 45.4 41.0 40.3  Platelets 150 - 400 K/uL 197 170 174     CMP Latest Ref Rng & Units 06/29/2018 12/27/2017 08/30/2017  Glucose 70 - 99 mg/dL 102(H) 101(H) 107  BUN 8 - 23 mg/dL 17 14 17   Creatinine 0.44 - 1.00 mg/dL 0.73 0.70 0.69  Sodium 135 - 145 mmol/L 140 141 140  Potassium 3.5 - 5.1 mmol/L 4.2 4.2 4.5  Chloride 98 - 111 mmol/L 104 105 108  CO2 22 - 32 mmol/L 24 29 22   Calcium 8.9 - 10.3 mg/dL 9.6 9.4 9.4  Total Protein 6.5 - 8.1 g/dL 6.9 6.6 6.3(L)  Total Bilirubin 0.3 - 1.2 mg/dL 0.5 0.4 0.4  Alkaline Phos 38 - 126 U/L 180(H) 162(H) 117  AST 15 - 41 U/L 18 20 17   ALT 0 - 44 U/L 18 19 15  RADIOGRAPHIC STUDIES: I have personally reviewed the radiological images as listed and agreed with the findings in the report. No results found.   ASSESSMENT & PLAN:  Bethany Frederick is a 68 y.o. female with   1. Colorectal cancer, invasive adenocarcinoma,pT3N2bM0, stage IIIC, MSI-stable  -She was diagnosed in 12/2015. She is s/p surgical resection and adjuvant CAPOX.  -Her surveillance scans have been NED since then.  -She has had a repeat colonoscopy with Dr. Watt Climes since her surgery. She will repeat in 3 years.  -She is clinically doing well. Labs reviewed, CBC WNL, CMP unremarkable except a mildly elevated alkaline phosphatase. CEA is still pending. Her physical exam was unremarkable. There is no clinical concern for recurrence.  -Continue Surveillance. She is 2.5 years since diagnosis. I discussed after the first 2 years her risk of recurrence has dropped significantly.  -F/u in 6 months with last surveillance CT scan    2. 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 This is considered a benign breast lesion, not high risk for breast cancer. -Continue annual screening mammogram, self exam and routine follow-up.  3. Peripheral neuropathy,  grade 1 -resolved   4. Overweight, Fatigue  -She previously gained 20 pounds over 9 months span, mostly related to diet and less physical activities.  -She does continue to gain weight since last visit.  -I again encouraged her to work on losing weight with exercise and a healthy diet.   5. Vertigo  -Recently returned.  -I recommend she try OTC Meclizine  -I suggest she follow up with ENT.     6. Elevated BP  -BP at 148/89 today, usually normas per patient, no history of hypertension   -She denies history of HTN, not on medication  -I encouraged her to monitor at drugstore or [employer] periodically.    Plan F/u in 6 months with lab and CT CAP w contrast a few days before    No problem-specific Assessment & Plan notes found for this encounter.   Orders Placed This Encounter  Procedures  . CT Abdomen Pelvis W Contrast    Standing Status:   Future    Standing Expiration Date:   06/29/2019    Order Specific Question:   If indicated for the ordered procedure, I authorize the administration of contrast media per Radiology protocol    Answer:   Yes    Order Specific Question:   Preferred imaging location?    Answer:   [facility]    Order Specific Question:   Is Oral Contrast requested for this exam?    Answer:   Yes, Per Radiology protocol    Order Specific Question:   Radiology Contrast Protocol - do NOT remove file path    Answer:   \\[email or web address]  . CT Chest W Contrast    Standing Status:   Future    Standing Expiration Date:   06/29/2019    Order Specific Question:   If indicated for the ordered procedure, I authorize the administration of contrast media per Radiology protocol    Answer:   Yes    Order Specific Question:   Preferred imaging location?    Answer:   [facility]    Order Specific Question:   Radiology Contrast Protocol - do NOT remove file path    Answer:   \\[email or web address]   All  questions were answered. The patient knows to call the clinic  with any problems, questions or concerns. No barriers to learning was detected. I spent 20 minutes counseling the patient face to face. The total time spent in the appointment was 25 minutes and more than 50% was on counseling and review of test results     Truitt Merle, MD 06/29/2018   I, Joslyn Devon, am acting as scribe for Truitt Merle, MD.   I have reviewed the above documentation for accuracy and completeness, and I agree with the above.

## 2018-06-29 ENCOUNTER — Inpatient Hospital Stay: Payer: Federal, State, Local not specified - PPO | Attending: Hematology

## 2018-06-29 ENCOUNTER — Encounter: Payer: Self-pay | Admitting: Hematology

## 2018-06-29 ENCOUNTER — Telehealth: Payer: Self-pay | Admitting: Hematology

## 2018-06-29 ENCOUNTER — Inpatient Hospital Stay (HOSPITAL_BASED_OUTPATIENT_CLINIC_OR_DEPARTMENT_OTHER): Payer: Federal, State, Local not specified - PPO | Admitting: Hematology

## 2018-06-29 VITALS — BP 148/89 | HR 75 | Temp 97.8°F | Resp 19 | Ht 67.0 in | Wt 212.7 lb

## 2018-06-29 DIAGNOSIS — E663 Overweight: Secondary | ICD-10-CM | POA: Diagnosis not present

## 2018-06-29 DIAGNOSIS — C19 Malignant neoplasm of rectosigmoid junction: Secondary | ICD-10-CM

## 2018-06-29 DIAGNOSIS — R42 Dizziness and giddiness: Secondary | ICD-10-CM | POA: Insufficient documentation

## 2018-06-29 DIAGNOSIS — C779 Secondary and unspecified malignant neoplasm of lymph node, unspecified: Secondary | ICD-10-CM | POA: Diagnosis not present

## 2018-06-29 DIAGNOSIS — R030 Elevated blood-pressure reading, without diagnosis of hypertension: Secondary | ICD-10-CM

## 2018-06-29 DIAGNOSIS — R5383 Other fatigue: Secondary | ICD-10-CM

## 2018-06-29 DIAGNOSIS — C20 Malignant neoplasm of rectum: Secondary | ICD-10-CM

## 2018-06-29 DIAGNOSIS — C186 Malignant neoplasm of descending colon: Secondary | ICD-10-CM

## 2018-06-29 LAB — CBC WITH DIFFERENTIAL/PLATELET
Abs Immature Granulocytes: 0.03 10*3/uL (ref 0.00–0.07)
Basophils Absolute: 0.1 10*3/uL (ref 0.0–0.1)
Basophils Relative: 1 %
Eosinophils Absolute: 0.1 10*3/uL (ref 0.0–0.5)
Eosinophils Relative: 2 %
HEMATOCRIT: 45.4 % (ref 36.0–46.0)
Hemoglobin: 14.8 g/dL (ref 12.0–15.0)
Immature Granulocytes: 1 %
LYMPHS ABS: 1.4 10*3/uL (ref 0.7–4.0)
Lymphocytes Relative: 22 %
MCH: 29.1 pg (ref 26.0–34.0)
MCHC: 32.6 g/dL (ref 30.0–36.0)
MCV: 89.2 fL (ref 80.0–100.0)
MONO ABS: 0.4 10*3/uL (ref 0.1–1.0)
MONOS PCT: 6 %
Neutro Abs: 4.5 10*3/uL (ref 1.7–7.7)
Neutrophils Relative %: 68 %
PLATELETS: 197 10*3/uL (ref 150–400)
RBC: 5.09 MIL/uL (ref 3.87–5.11)
RDW: 13.8 % (ref 11.5–15.5)
WBC: 6.5 10*3/uL (ref 4.0–10.5)
nRBC: 0 % (ref 0.0–0.2)

## 2018-06-29 LAB — COMPREHENSIVE METABOLIC PANEL
ALBUMIN: 4.1 g/dL (ref 3.5–5.0)
ALK PHOS: 180 U/L — AB (ref 38–126)
ALT: 18 U/L (ref 0–44)
AST: 18 U/L (ref 15–41)
Anion gap: 12 (ref 5–15)
BUN: 17 mg/dL (ref 8–23)
CALCIUM: 9.6 mg/dL (ref 8.9–10.3)
CO2: 24 mmol/L (ref 22–32)
CREATININE: 0.73 mg/dL (ref 0.44–1.00)
Chloride: 104 mmol/L (ref 98–111)
GFR calc Af Amer: >60 mL/min (ref >60)
GFR calc non Af Amer: >60 mL/min (ref >60)
GLUCOSE: 102 mg/dL — AB (ref 70–99)
Potassium: 4.2 mmol/L (ref 3.5–5.1)
Sodium: 140 mmol/L (ref 135–145)
Total Bilirubin: 0.5 mg/dL (ref 0.3–1.2)
Total Protein: 6.9 g/dL (ref 6.5–8.1)

## 2018-06-29 LAB — CEA (IN HOUSE-CHCC): CEA (CHCC-In House): 2.7 ng/mL (ref 0.00–5.00)

## 2018-06-29 NOTE — Telephone Encounter (Signed)
Called patient and scheduled appt per 2/21 los.  Gave patient the number to central radiology and told the patient she will need contrast also.  Patient aware of the appts I scheduled.

## 2018-07-02 ENCOUNTER — Telehealth: Payer: Self-pay

## 2018-07-02 NOTE — Telephone Encounter (Signed)
-----   Message from Truitt Merle, MD sent at 06/30/2018  6:27 PM EST ----- Please let pt know her CEA was normal, no concerns, thanks   Truitt Merle  06/30/2018

## 2018-07-02 NOTE — Telephone Encounter (Signed)
Left voice message for patient that CEA was normal, per Dr. Burr Medico, no concerns.

## 2018-08-10 IMAGING — CT CT CHEST W/O CM
2 of 4 series · 15 of 36 positions shown, 18 images · non-contrast
Comparison: CT 01/05/2016 MRI 11/06/2015

CLINICAL DATA: Colon cancer diagnosed December 2015. History of
surgical resection.

EXAM:
CT CHEST WITHOUT CONTRAST
TECHNIQUE: Multidetector CT imaging of the chest was performed following the
standard protocol without IV contrast.

[Series 2: chest w/o st · axial · non-contrast · 0.76mm/px · z∈[-322,-36]mm · 12 of 167 slices shown, 15 images]
[im 12/167  mediastinal]
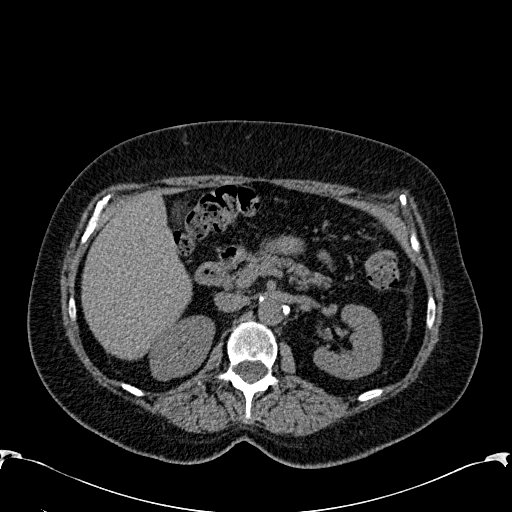
[im 12/167  lung]
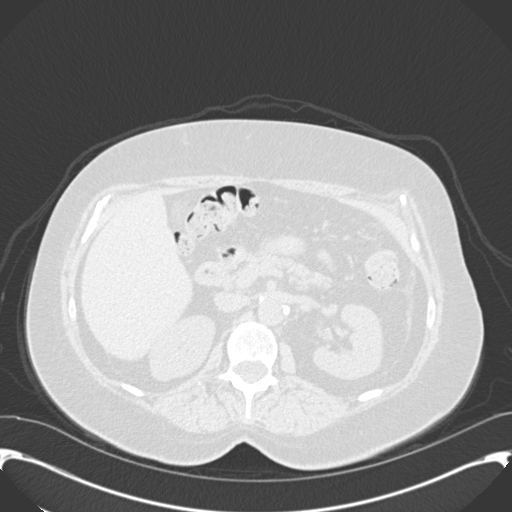
[im 23/167  lung]
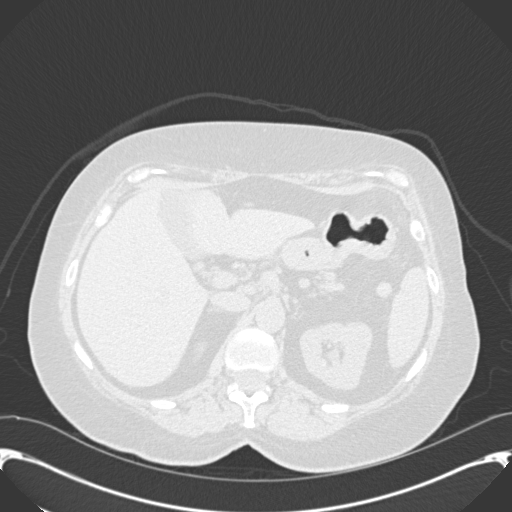
[im 34/167  lung]
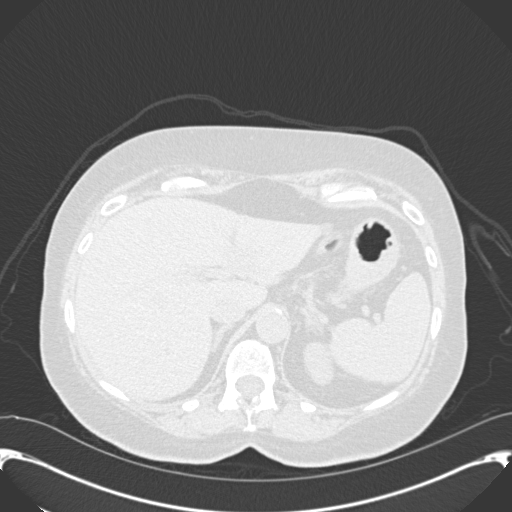
[im 56/167  lung]
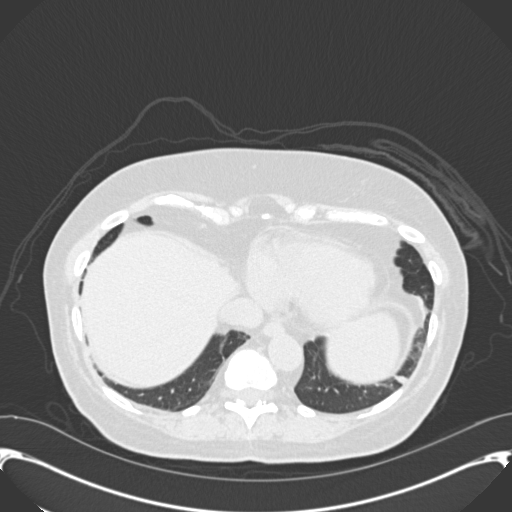
[im 67/167  mediastinal]
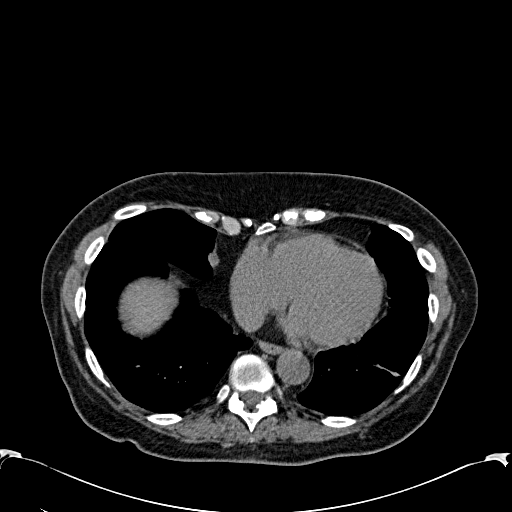
[im 67/167  lung]
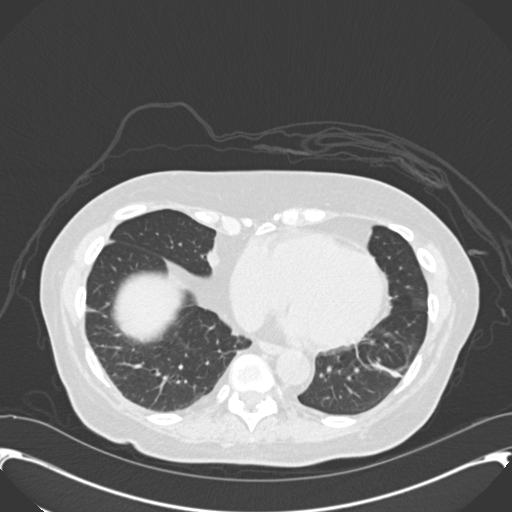
[im 78/167  lung]
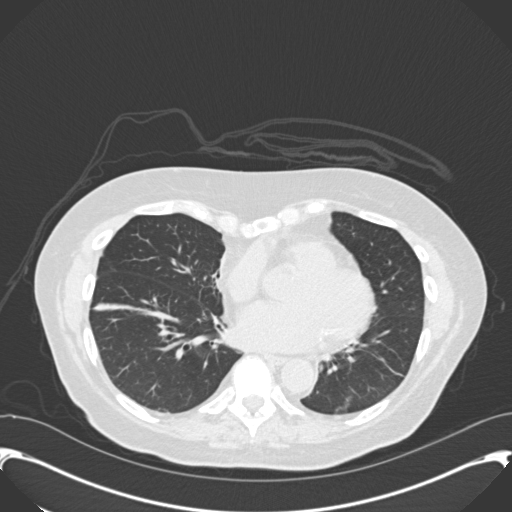
[im 89/167  lung]
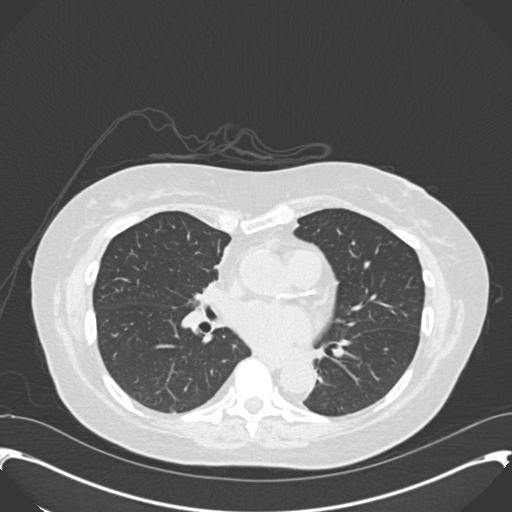
[im 100/167  lung]
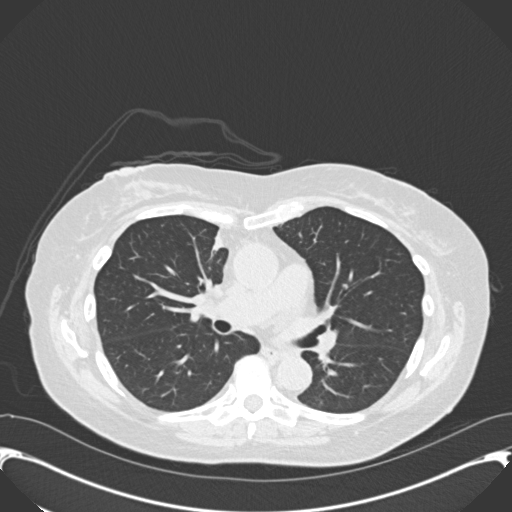
[im 111/167  mediastinal]
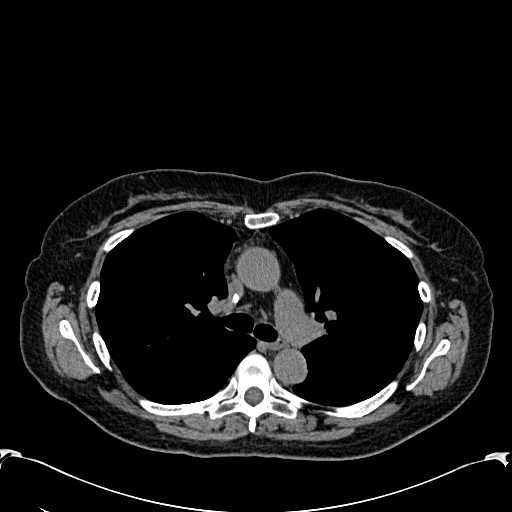
[im 111/167  lung]
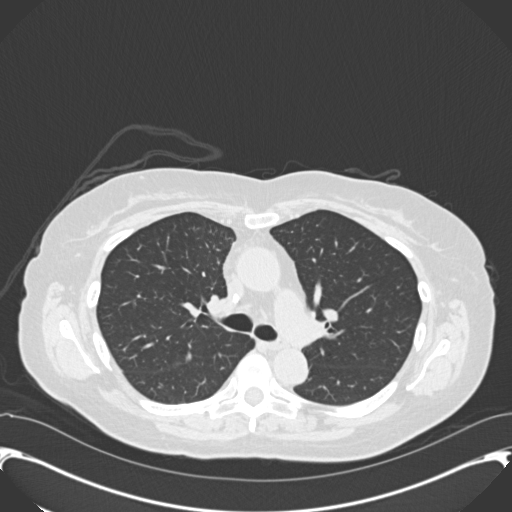
[im 133/167  lung]
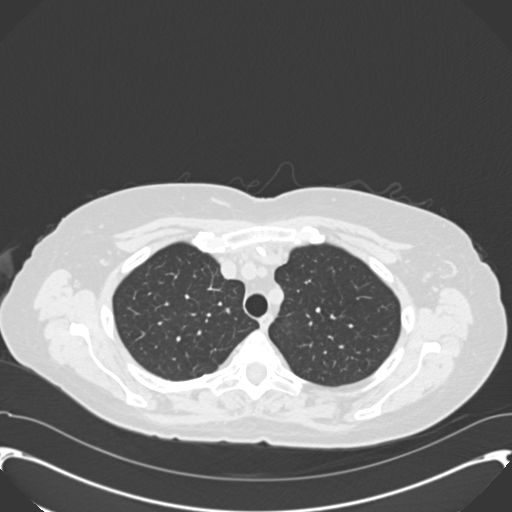
[im 144/167  lung]
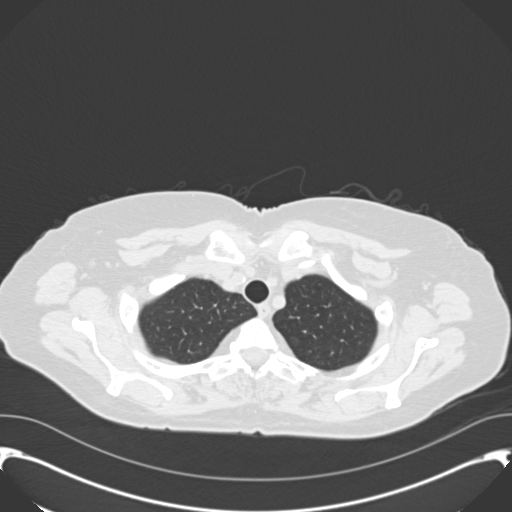
[im 155/167  lung]
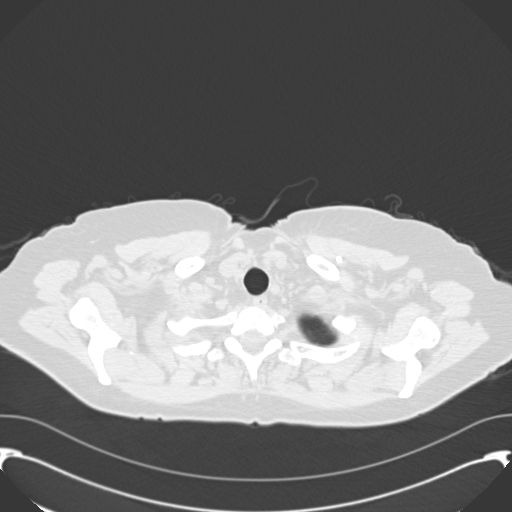

[Series 602: <mpr thick range> · coronal · 0.76mm/px · 3 of 117 slices shown]
[im 24/117  lung]
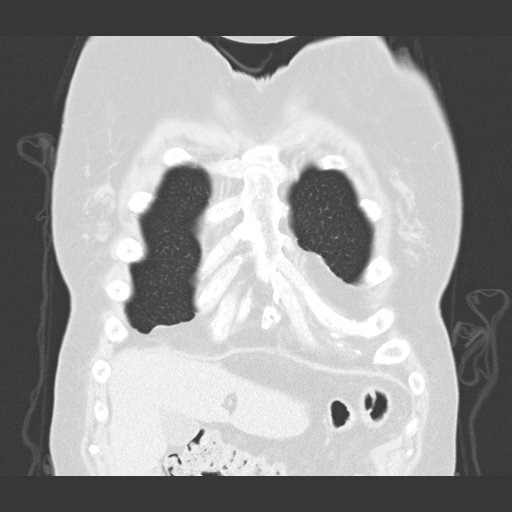
[im 47/117  lung]
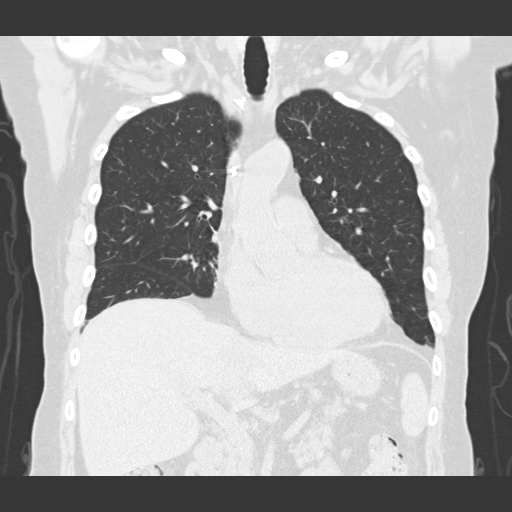
[im 70/117  lung]
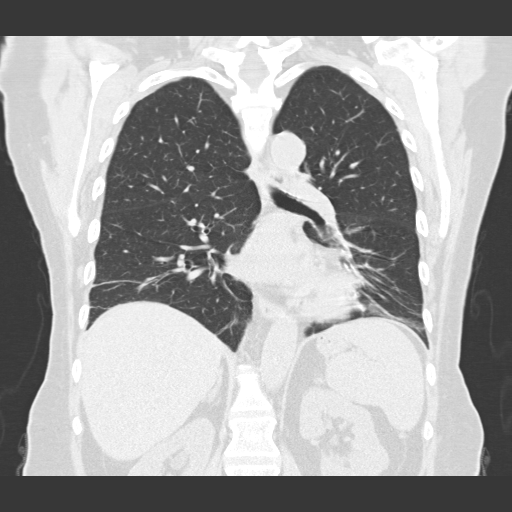

[15 of 36 positions shown; findings below may reference images not displayed]

FINDINGS: Cardiovascular: Coronary artery calcification and aortic
atherosclerotic calcification. central venous line noted

Mediastinum/Nodes: No axillary or supraclavicular lymphadenopathy.
No mediastinal hilar adenopathy. Esophagus normal.

Lungs/Pleura: There is atelectasis at the lung bases.

Upper Abdomen: Large round lesion in the LEFT hepatic lobe measuring
4.6 cm is again demonstrated and characterized as a hemangioma on
comparison MRI.

Musculoskeletal: No aggressive osseous lesion.
IMPRESSION: 1. No evidence of pulmonary metastasis.
2. Stable large hepatic hemangioma.

## 2018-12-04 DIAGNOSIS — H04123 Dry eye syndrome of bilateral lacrimal glands: Secondary | ICD-10-CM | POA: Diagnosis not present

## 2018-12-04 DIAGNOSIS — H40023 Open angle with borderline findings, high risk, bilateral: Secondary | ICD-10-CM | POA: Diagnosis not present

## 2018-12-04 DIAGNOSIS — H2513 Age-related nuclear cataract, bilateral: Secondary | ICD-10-CM | POA: Diagnosis not present

## 2018-12-04 DIAGNOSIS — H353131 Nonexudative age-related macular degeneration, bilateral, early dry stage: Secondary | ICD-10-CM | POA: Diagnosis not present

## 2018-12-24 ENCOUNTER — Other Ambulatory Visit: Payer: Self-pay

## 2018-12-24 ENCOUNTER — Inpatient Hospital Stay: Payer: Federal, State, Local not specified - PPO | Attending: Hematology

## 2018-12-24 ENCOUNTER — Ambulatory Visit (HOSPITAL_COMMUNITY)
Admission: RE | Admit: 2018-12-24 | Discharge: 2018-12-24 | Disposition: A | Discharge status: Home / Self Care | Payer: Federal, State, Local not specified - PPO | Source: Ambulatory Visit | Attending: Hematology | Admitting: Hematology

## 2018-12-24 DIAGNOSIS — C19 Malignant neoplasm of rectosigmoid junction: Secondary | ICD-10-CM | POA: Diagnosis not present

## 2018-12-24 DIAGNOSIS — E663 Overweight: Secondary | ICD-10-CM | POA: Insufficient documentation

## 2018-12-24 DIAGNOSIS — C186 Malignant neoplasm of descending colon: Secondary | ICD-10-CM

## 2018-12-24 DIAGNOSIS — C20 Malignant neoplasm of rectum: Secondary | ICD-10-CM

## 2018-12-24 LAB — COMPREHENSIVE METABOLIC PANEL
ALT: 17 U/L (ref 0–44)
AST: 18 U/L (ref 15–41)
Albumin: 4 g/dL (ref 3.5–5.0)
Alkaline Phosphatase: 139 U/L — ABNORMAL HIGH (ref 38–126)
Anion gap: 8 (ref 5–15)
BUN: 12 mg/dL (ref 8–23)
CO2: 27 mmol/L (ref 22–32)
Calcium: 9.6 mg/dL (ref 8.9–10.3)
Chloride: 106 mmol/L (ref 98–111)
Creatinine, Ser: 0.73 mg/dL (ref 0.44–1.00)
GFR calc Af Amer: >60 mL/min (ref >60)
GFR calc non Af Amer: >60 mL/min (ref >60)
Glucose, Bld: 108 mg/dL — ABNORMAL HIGH (ref 70–99)
Potassium: 4.9 mmol/L (ref 3.5–5.1)
Sodium: 141 mmol/L (ref 135–145)
Total Bilirubin: 0.4 mg/dL (ref 0.3–1.2)
Total Protein: 6.7 g/dL (ref 6.5–8.1)

## 2018-12-24 LAB — CBC WITH DIFFERENTIAL/PLATELET
Abs Immature Granulocytes: 0.02 10*3/uL (ref 0.00–0.07)
Basophils Absolute: 0.1 10*3/uL (ref 0.0–0.1)
Basophils Relative: 1 %
Eosinophils Absolute: 0.2 10*3/uL (ref 0.0–0.5)
Eosinophils Relative: 3 %
HCT: 43.7 % (ref 36.0–46.0)
Hemoglobin: 14.3 g/dL (ref 12.0–15.0)
Immature Granulocytes: 0 %
Lymphocytes Relative: 23 %
Lymphs Abs: 1.5 10*3/uL (ref 0.7–4.0)
MCH: 29.1 pg (ref 26.0–34.0)
MCHC: 32.7 g/dL (ref 30.0–36.0)
MCV: 89 fL (ref 80.0–100.0)
Monocytes Absolute: 0.5 10*3/uL (ref 0.1–1.0)
Monocytes Relative: 8 %
Neutro Abs: 4.2 10*3/uL (ref 1.7–7.7)
Neutrophils Relative %: 65 %
Platelets: 196 10*3/uL (ref 150–400)
RBC: 4.91 MIL/uL (ref 3.87–5.11)
RDW: 14.2 % (ref 11.5–15.5)
WBC: 6.4 10*3/uL (ref 4.0–10.5)
nRBC: 0 % (ref 0.0–0.2)

## 2018-12-24 LAB — CEA (IN HOUSE-CHCC): CEA (CHCC-In House): 1.69 ng/mL (ref 0.00–5.00)

## 2018-12-24 MED ORDER — IOHEXOL 300 MG/ML  SOLN
100.0000 mL | Freq: Once | INTRAMUSCULAR | Status: AC | PRN
  Administered 2018-12-24: 100 mL via INTRAVENOUS

## 2018-12-24 NOTE — Progress Notes (Signed)
[place]   [phone] Fax:([phone]   Clinic Follow up Note   Patient Care Team: [name], MD as PCP - General (Family Medicine) Jovita Kussmaul, MD as Consulting Physician (General Surgery)  Date of Service:  12/26/2018  CHIEF COMPLAINT: F/u stage III colon cancer  SUMMARY OF ONCOLOGIC HISTORY: Oncology History Overview Note  Rectal cancer [facility])   Staging form: Colon and Rectum, AJCC 7th Edition   - Pathologic stage from 01/28/2016: Stage IIIC (T3, N2b, cM0) - Signed by Truitt Merle, MD on 02/12/2016    Cancer of rectosigmoid (colon) ([place])  01/03/2016 Initial Biopsy   Diagnosis Colon, biopsy, rectosigmoid junction - ADENOCARCINOMA, SEE COMMENT.   01/04/2016 Procedure   Colonoscopy by Dr. Estanislado Emms showed a partially obstructing tumor in the rectal sigmoid colon, biopsied.   01/05/2016 Imaging   CT abdomen and pelvis with contrast showed no dominant sigmoid colon mass, soft tissue fullness at the rectosigmoid junction, suspicious for the primary lesion. Borderline enlarged nodes in the sigmoid mesocolon and at the origin of the inferior mesenteric artery are indeterminate no evidence of December metastasis. Left hepatic lobe giant hemangioma.    01/28/2016 Initial Diagnosis   Rectal cancer ([place])   01/28/2016 Surgery   Endoscopic low anterior resection of rectosigmoid segmental colon for tumor    01/28/2016 Pathology Results   Invasive colonic adenocarcinoma, 4.5 cm extending into her chronic connective tissue, lymphovascular involvement by tumor, margins were negative, metastatic carcinoma in 9 of 21 lymph nodes.   02/24/2016 Imaging   CT chest w/o contrast IMPRESSION: 1. No evidence of pulmonary metastasis. 2. Stable large hepatic hemangioma.   03/08/2016 - 08/10/2016 Adjuvant Chemotherapy   Adjuvant chemotherapy with CAPOX (Xeloda 2000 mg twice daily, on day 1-14, oxaliplatin 153m/m2 on day 1) every 3 weeks,  Oxaliplatin held after 4  cycles and she had additional 4 cycles of Xeloda.    09/14/2016 Imaging   CT CAP W Contrast IMPRESSION: No evidence of locally recurrent or metastatic carcinoma. Small periumbilical hernia containing small bowel. Stable 5 cm benign hemangioma in left hepatic lobe. Stable small calcified uterine fibroid. Colonic diverticulosis. No radiographic evidence of diverticulitis.   09/14/2016 Imaging   CT CAP w contrast  IMPRESSION: No evidence of locally recurrent or metastatic carcinoma. Small periumbilical hernia containing small bowel. Stable 5 cm benign hemangioma in left hepatic lobe. Stable small calcified uterine fibroid. Colonic diverticulosis. No radiographic evidence of diverticulitis.   04/10/2017 Imaging   CT CAP W Contrast 04/10/17 IMPRESSION: 1. No findings of recurrent malignancy. The hepatic lesions have benign imaging characteristics. 2. Interval ventral hernia repair without complicating feature. 3. Other imaging findings of potential clinical significance: Aortic Atherosclerosis (ICD10-I70.0). Mild cardiomegaly. Stable scarring in both lungs. Sigmoid diverticulosis. Prominent stool throughout the colon favors constipation. Calcified anterior subserosal fibroid along the uterus.   12/27/2017 Imaging   CT CAP W Contrast 12/27/17 IMPRESSION: 1. Recurrent ventral hernia along the inferior aspect of the midline incision containing only fat. 2. Stable negative chest CT.  No acute abdominopelvic findings. 3. No evidence of metastatic disease post partial colectomy. 4. Stable hepatic hemangioma and cysts. Coronary and Aortic Atherosclerosis (ICD10-I70.0).   12/24/2018 Imaging    CT CAP W Contrast 12/24/18  IMPRESSION: 1. Stable exam. No findings of metastatic disease status post partial colectomy. 2. Unchanged liver hemangioma and liver cysts. 3.  Aortic Atherosclerosis (ICD10-I70.0).        CURRENT THERAPY:  Surveillance  INTERVAL HISTORY:  Bethany Frederick  is here  for a follow up colon cancer. She presents to the clinic alone. She notes she is doing well. She feels she has gained weight, but weight is stable. She notes she eats low carb diet.     REVIEW OF SYSTEMS:   Constitutional: Denies fevers, chills or abnormal weight loss Eyes: Denies blurriness of vision Ears, nose, mouth, throat, and face: Denies mucositis or sore throat Respiratory: Denies cough, dyspnea or wheezes Cardiovascular: Denies palpitation, chest discomfort or lower extremity swelling Gastrointestinal:  Denies nausea, heartburn or change in bowel habits Skin: Denies abnormal skin rashes Lymphatics: Denies new lymphadenopathy or easy bruising Neurological:Denies numbness, tingling or new weaknesses Behavioral/Psych: Mood is stable, no new changes  All other systems were reviewed with the patient and are negative.  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   Seasonal allergies    Wears partial dentures    upper front - 1 tooth    SURGICAL HISTORY: Past Surgical History:  Procedure Laterality Date   APPENDECTOMY     BREAST BIOPSY Left 2016   BREAST LUMPECTOMY Left 03/2015   BREAST LUMPECTOMY WITH RADIOACTIVE SEED LOCALIZATION Left 03/13/2015   Procedure: BREAST LUMPECTOMY WITH RADIOACTIVE SEED LOCALIZATION;  Surgeon: Autumn Messing III, MD;  Location: [place];  Service: General;  Laterality: Left;   COLECTOMY     COLONOSCOPY     ENDOMETRIAL FULGURATION  06/13/2000   HERNIA REPAIR     INGUINAL HERNIA REPAIR  03/05/2012   Procedure: HERNIA REPAIR INGUINAL INCARCERATED;  Surgeon: Ralene Ok, MD;  Location: [place];  Service: General;   Laterality: left;   INGUINAL HERNIA REPAIR Left    INSERTION OF MESH  03/05/2012   Procedure: INSERTION OF MESH;  Surgeon: Ralene Ok, MD;  Location: [place];  Service: General;  Laterality: Left;   INSERTION OF MESH N/A 03/09/2017   Procedure: INSERTION OF MESH;  Surgeon: Jovita Kussmaul, MD;  Location: [place];  Service: General;  Laterality: N/A;   LAPAROSCOPIC ASSISTED VENTRAL HERNIA REPAIR  03/09/2017   w/mesh   LAPAROSCOPIC SIGMOID COLECTOMY Bilateral 01/28/2016   Procedure: LAPAROSCOPIC ASSISTED SIGMOID COLECTOMY;  Surgeon: Autumn Messing III, MD;  Location: [place];  Service: General;  Laterality: Bilateral;   MYOMECTOMY  06/13/2000   PORT-A-CATH REMOVAL Right 03/09/2017   PORT-A-CATH REMOVAL N/A 03/09/2017   Procedure: REMOVAL PORT-A-CATH RIGHT CHEST;  Surgeon: Jovita Kussmaul, MD;  Location: [place];  Service: General;  Laterality: N/A;   PORTACATH PLACEMENT N/A 02/18/2016   Procedure: INSERTION PORT-A-CATH;  Surgeon: Autumn Messing III, MD;  Location: [place];  Service: General;  Laterality: N/A;   SHOULDER ARTHROSCOPY W/ ROTATOR CUFF REPAIR Left 2013   VENTRAL HERNIA REPAIR N/A 03/09/2017   Procedure: LAPAROSCOPIC VENTRAL HERNIA REPAIR;  Surgeon: Jovita Kussmaul, MD;  Location: [place];  Service: General;  Laterality: N/A;    I have reviewed the social history and family history with the patient and they are unchanged from previous note.  ALLERGIES:  is allergic to no known allergies.  MEDICATIONS:  Current Outpatient Medications  Medication Sig Dispense Refill   clobetasol cream (TEMOVATE) 0.05 %  Apply 1 application topically 2 (two) times daily as needed (RASH).      famotidine (PEPCID) 20 MG tablet Take 20 mg by mouth daily as needed for heartburn or indigestion.   0   ibuprofen (ADVIL,MOTRIN) 200 MG tablet Take 400 mg by mouth 2 (two) times daily as needed for mild pain.     Multiple Vitamins-Minerals (PRESERVISION AREDS 2+MULTI VIT PO) Take by mouth.      Polyethyl Glycol-Propyl Glycol (SYSTANE OP) Apply 1 drop to eye 2 (two) times daily.     Vitamin D, Ergocalciferol, (DRISDOL) 50000 units CAPS capsule Take 1.25 Units by mouth once a week.     No current facility-administered medications for this visit.    Facility-Administered Medications Ordered in Other Visits  Medication Dose Route Frequency Provider Last Rate Last Dose   sodium chloride flush (NS) 0.9 % injection 10 mL  10 mL Intravenous PRN Truitt Merle, MD   10 mL at 05/31/16 1303    PHYSICAL EXAMINATION: ECOG PERFORMANCE STATUS: 0 - Asymptomatic  Vitals:   12/26/18 1152  BP: 129/64  Pulse: 70  Resp: 18  Temp: 98.9 F (37.2 C)  SpO2: 100%   Filed Weights   12/26/18 1152  Weight: 212 lb 9.6 oz (96.4 kg)    GENERAL:alert, no distress and comfortable SKIN: skin color, texture, turgor are normal, no rashes or significant lesions EYES: normal, Conjunctiva are pink and non-injected, sclera clear  NECK: supple, thyroid normal size, non-tender, without nodularity LYMPH:  no palpable lymphadenopathy in the cervical, axillary  LUNGS: clear to auscultation and percussion with normal breathing effort HEART: regular rate & rhythm and no murmurs and no lower extremity edema ABDOMEN:abdomen soft, non-tender and normal bowel sounds Musculoskeletal:no cyanosis of digits and no clubbing  NEURO: alert & oriented x 3 with fluent speech, no focal motor/sensory deficits  LABORATORY DATA:  I have reviewed the data as listed CBC Latest Ref Rng & Units 12/24/2018 06/29/2018 12/27/2017  WBC 4.0 - 10.5 K/uL 6.4 6.5 5.5  Hemoglobin 12.0 - 15.0 g/dL 14.3 14.8 13.6  Hematocrit 36.0 - 46.0 % 43.7 45.4 41.0  Platelets 150 - 400 K/uL 196 197 170     CMP Latest Ref Rng & Units 12/24/2018 06/29/2018 12/27/2017  Glucose 70 - 99 mg/dL 108(H) 102(H) 101(H)  BUN 8 - 23 mg/dL 12 17 14   Creatinine 0.44 - 1.00 mg/dL 0.73 0.73 0.70  Sodium 135 - 145 mmol/L 141 140 141  Potassium 3.5 - 5.1 mmol/L 4.9 4.2  4.2  Chloride 98 - 111 mmol/L 106 104 105  CO2 22 - 32 mmol/L 27 24 29   Calcium 8.9 - 10.3 mg/dL 9.6 9.6 9.4  Total Protein 6.5 - 8.1 g/dL 6.7 6.9 6.6  Total Bilirubin 0.3 - 1.2 mg/dL 0.4 0.5 0.4  Alkaline Phos 38 - 126 U/L 139(H) 180(H) 162(H)  AST 15 - 41 U/L 18 18 20   ALT 0 - 44 U/L 17 18 19       RADIOGRAPHIC STUDIES: I have personally reviewed the radiological images as listed and agreed with the findings in the report. Ct Chest W Contrast  Result Date: 12/24/2018 CLINICAL DATA:  Metastatic colon cancer.  Restaging. EXAM: CT CHEST, ABDOMEN, AND PELVIS WITH CONTRAST TECHNIQUE: Multidetector CT imaging of the chest, abdomen and pelvis was performed following the standard protocol during bolus administration of intravenous contrast. CONTRAST:  183m OMNIPAQUE IOHEXOL 300 MG/ML  SOLN COMPARISON:  12/27/2017 FINDINGS: CT CHEST FINDINGS Cardiovascular: Normal heart size. Aortic atherosclerosis. Left main,  lad coronary artery calcifications. Mediastinum/Nodes: Normal appearance of the thyroid gland. The trachea appears patent and is midline. Normal appearance of the esophagus. No supraclavicular, axillary, mediastinal or hilar adenopathy. Lungs/Pleura: No pleural effusion. Unchanged appearance of right middle lobe, lingula and bilateral lower lobe areas of linear scarring. No suspicious pulmonary nodule or mass. Musculoskeletal: No aggressive lytic or sclerotic bone lesions. CT ABDOMEN PELVIS FINDINGS Hepatobiliary: The left lobe of liver lesion previously characterized as a benign hemangioma is again noted. On today's study this measures 4.8 x 5.0 by 4.0 cm. Previously 5.0 by 5.0 by 4.1 cm. Adjacent low-density foci are unchanged. Left lobe of liver cyst measures 1.1 cm, image 31/5. Gallbladder negative. No biliary dilatation. Pancreas: Unremarkable. No pancreatic ductal dilatation or surrounding inflammatory changes. Spleen: Normal in size without focal abnormality. Adrenals/Urinary Tract: Normal  appearance of the adrenal glands. Bilateral kidney cysts, are again noted. Cyst arising from inferior pole of right kidney measures 1.6 cm. Previously 1.3 cm. No mass or hydronephrosis. Urinary bladder normal. Stomach/Bowel: Stomach is within normal limits. Distal colonic diverticulosis identified without acute inflammation. No evidence of bowel wall thickening, distention, or inflammatory changes. Vascular/Lymphatic: Aortic atherosclerosis. No aneurysm. No abdominopelvic adenopathy. Reproductive: Calcified uterine fibroid is again noted. No adnexal mass. Other: No free fluid or fluid collection. No peritoneal nodularity identified. Ventral pelvic wall hernia is identified which contains fat. Musculoskeletal: No acute or significant osseous findings. IMPRESSION: 1. Stable exam. No findings of metastatic disease status post partial colectomy. 2. Unchanged liver hemangioma and liver cysts. 3.  Aortic Atherosclerosis (ICD10-I70.0). Electronically Signed   By: Kerby Moors M.D.   On: 12/24/2018 13:06   Ct Abdomen Pelvis W Contrast  Result Date: 12/24/2018 CLINICAL DATA:  Metastatic colon cancer.  Restaging. EXAM: CT CHEST, ABDOMEN, AND PELVIS WITH CONTRAST TECHNIQUE: Multidetector CT imaging of the chest, abdomen and pelvis was performed following the standard protocol during bolus administration of intravenous contrast. CONTRAST:  12m OMNIPAQUE IOHEXOL 300 MG/ML  SOLN COMPARISON:  12/27/2017 FINDINGS: CT CHEST FINDINGS Cardiovascular: Normal heart size. Aortic atherosclerosis. Left main, lad coronary artery calcifications. Mediastinum/Nodes: Normal appearance of the thyroid gland. The trachea appears patent and is midline. Normal appearance of the esophagus. No supraclavicular, axillary, mediastinal or hilar adenopathy. Lungs/Pleura: No pleural effusion. Unchanged appearance of right middle lobe, lingula and bilateral lower lobe areas of linear scarring. No suspicious pulmonary nodule or mass. Musculoskeletal:  No aggressive lytic or sclerotic bone lesions. CT ABDOMEN PELVIS FINDINGS Hepatobiliary: The left lobe of liver lesion previously characterized as a benign hemangioma is again noted. On today's study this measures 4.8 x 5.0 by 4.0 cm. Previously 5.0 by 5.0 by 4.1 cm. Adjacent low-density foci are unchanged. Left lobe of liver cyst measures 1.1 cm, image 31/5. Gallbladder negative. No biliary dilatation. Pancreas: Unremarkable. No pancreatic ductal dilatation or surrounding inflammatory changes. Spleen: Normal in size without focal abnormality. Adrenals/Urinary Tract: Normal appearance of the adrenal glands. Bilateral kidney cysts, are again noted. Cyst arising from inferior pole of right kidney measures 1.6 cm. Previously 1.3 cm. No mass or hydronephrosis. Urinary bladder normal. Stomach/Bowel: Stomach is within normal limits. Distal colonic diverticulosis identified without acute inflammation. No evidence of bowel wall thickening, distention, or inflammatory changes. Vascular/Lymphatic: Aortic atherosclerosis. No aneurysm. No abdominopelvic adenopathy. Reproductive: Calcified uterine fibroid is again noted. No adnexal mass. Other: No free fluid or fluid collection. No peritoneal nodularity identified. Ventral pelvic wall hernia is identified which contains fat. Musculoskeletal: No acute or significant osseous findings. IMPRESSION: 1. Stable exam.  No findings of metastatic disease status post partial colectomy. 2. Unchanged liver hemangioma and liver cysts. 3.  Aortic Atherosclerosis (ICD10-I70.0). Electronically Signed   By: Kerby Moors M.D.   On: 12/24/2018 13:06     ASSESSMENT & PLAN:  Bethany Frederick is a 68 y.o. female with   1. Colorectal cancer, invasive adenocarcinoma,pT3N2bM0, stage IIIC, MSI-stable  -She was diagnosed in 12/2015. She is s/p surgical resection and adjuvant CAPOX.  -Her surveillance scans have been NED since then.  -She has had a repeat colonoscopy with Dr. Watt Climes since her surgery.  She will repeat in 3 years.  -We reviewed and discussed her CT CAP frm 12/24/18 which shows no findings of metastatic disease. She has unchanged hemangioma and liver cysts.  -She is clinically doing well and stable. Labs from last week reveiwed, CBC and CMP WNL except BG 108, Alk Phos 139. There is no clinical concern for recurrence.  -Continue Surveillance. She is 3 years since diagnosis. I discussed her risk of recurrance has dropped significantly. Will coitnue to f/u for a totoal of 5 years. I do not plan to repeat scan unless she has concerning symptoms.  -I encouregd her to have yearly mammograms and regular paps smears with her Gyn. She will also continue to f/u with her PCP.  -F/u in 6 months   2. 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 This is considered a benign breast lesion, not high risk for breast cancer. -Continue annual screening mammogram, self exam and routine follow-up.  3. Overweight, Fatigue  -Shepreviouslygained 20 poundsover 9 monthsspan, mostly related to diet and less physical activities.  -She does continue to gain weight since last visit.  -I again encouraged her to work on losing weight with exercise and a healthy diet.   4. Vertigo  -Recently returned.  -I recommend she try OTC Meclizine  -I suggest she follow up with ENT.    5. Elevated BP  -148/89 in 06/2018. Usually normas per patient, no history of hypertension   -She denies history of HTN, not on medication  -I encouraged her to monitor at [facility]store or [facility] periodically.    Plan -lab and CT CAP reveiwed, NED. She is clinically doing well  -Lab and f/u in 6 months   No problem-specific Assessment & Plan notes found for this encounter.   No orders of the defined types were placed in this encounter.  All questions were answered. The patient knows to call the clinic with  any problems, questions or concerns. No barriers to learning was detected. I spent 15 minutes counseling the patient face to face. The total time spent in the appointment was 25 minutes and more than 50% was on counseling and review of test results     Truitt Merle, MD 12/26/2018   I, Joslyn Devon, am acting as scribe for Truitt Merle, MD.   I have reviewed the above documentation for accuracy and completeness, and I agree with the above.

## 2018-12-26 ENCOUNTER — Telehealth: Payer: Self-pay | Admitting: Hematology

## 2018-12-26 ENCOUNTER — Other Ambulatory Visit: Payer: Self-pay

## 2018-12-26 ENCOUNTER — Inpatient Hospital Stay (HOSPITAL_BASED_OUTPATIENT_CLINIC_OR_DEPARTMENT_OTHER): Payer: Federal, State, Local not specified - PPO | Admitting: Hematology

## 2018-12-26 ENCOUNTER — Encounter: Payer: Self-pay | Admitting: Hematology

## 2018-12-26 VITALS — BP 129/64 | HR 70 | Temp 98.9°F | Resp 18 | Ht 67.0 in | Wt 212.6 lb

## 2018-12-26 DIAGNOSIS — C19 Malignant neoplasm of rectosigmoid junction: Secondary | ICD-10-CM | POA: Diagnosis not present

## 2018-12-26 DIAGNOSIS — E663 Overweight: Secondary | ICD-10-CM | POA: Diagnosis not present

## 2018-12-26 NOTE — Telephone Encounter (Signed)
Scheduled appt per 8/19 los. ° °Spoke with patient and she is aware of her appt date and time. °

## 2019-02-21 DIAGNOSIS — Z1231 Encounter for screening mammogram for malignant neoplasm of breast: Secondary | ICD-10-CM | POA: Diagnosis not present

## 2019-02-21 DIAGNOSIS — Z01419 Encounter for gynecological examination (general) (routine) without abnormal findings: Secondary | ICD-10-CM | POA: Diagnosis not present

## 2019-02-21 DIAGNOSIS — Z23 Encounter for immunization: Secondary | ICD-10-CM | POA: Diagnosis not present

## 2019-02-21 DIAGNOSIS — Z6834 Body mass index (BMI) 34.0-34.9, adult: Secondary | ICD-10-CM | POA: Diagnosis not present

## 2019-04-09 DIAGNOSIS — H40023 Open angle with borderline findings, high risk, bilateral: Secondary | ICD-10-CM | POA: Diagnosis not present

## 2019-04-09 DIAGNOSIS — H04123 Dry eye syndrome of bilateral lacrimal glands: Secondary | ICD-10-CM | POA: Diagnosis not present

## 2019-04-09 DIAGNOSIS — H2513 Age-related nuclear cataract, bilateral: Secondary | ICD-10-CM | POA: Diagnosis not present

## 2019-04-09 DIAGNOSIS — H353131 Nonexudative age-related macular degeneration, bilateral, early dry stage: Secondary | ICD-10-CM | POA: Diagnosis not present

## 2019-05-31 ENCOUNTER — Ambulatory Visit: Payer: Federal, State, Local not specified - PPO | Attending: Internal Medicine

## 2019-05-31 DIAGNOSIS — Z23 Encounter for immunization: Secondary | ICD-10-CM | POA: Insufficient documentation

## 2019-05-31 NOTE — Progress Notes (Signed)
   Covid-19 Vaccination Clinic  Name:  Bethany Frederick    MRN: [id number] DOB: 1950/10/02  05/31/2019  Bethany Frederick was observed post Covid-19 immunization for 15 minutes without incidence. She was provided with Vaccine Information Sheet and instruction to access the V-Safe system.   Bethany Frederick was instructed to call 911 with any severe reactions post vaccine: Marland Kitchen Difficulty breathing  . Swelling of your face and throat  . A fast heartbeat  . A bad rash all over your body  . Dizziness and weakness    Immunizations Administered    Name Date Dose VIS Date Route   Pfizer COVID-19 Vaccine 05/31/2019  2:32 PM 0.3 mL 04/19/2019 Intramuscular   Manufacturer: [place]   Lot: [id number]   [place]: [id number]

## 2019-06-21 ENCOUNTER — Ambulatory Visit: Payer: Federal, State, Local not specified - PPO | Attending: Internal Medicine

## 2019-06-21 DIAGNOSIS — Z23 Encounter for immunization: Secondary | ICD-10-CM

## 2019-06-21 NOTE — Progress Notes (Signed)
   Covid-19 Vaccination Clinic  Name:  TYLISA OTTEN    MRN: [id number] DOB: 1950/07/31  06/21/2019  Ms. Frankum was observed post Covid-19 immunization for 15 minutes without incidence. She was provided with Vaccine Information Sheet and instruction to access the V-Safe system.   Ms. Basilio was instructed to call 911 with any severe reactions post vaccine: Marland Kitchen Difficulty breathing  . Swelling of your face and throat  . A fast heartbeat  . A bad rash all over your body  . Dizziness and weakness    Immunizations Administered    Name Date Dose VIS Date Route   Pfizer COVID-19 Vaccine 06/21/2019 11:24 AM 0.3 mL 04/19/2019 Intramuscular   Manufacturer: [place]   Lot: [id number]   [place]: [id number]

## 2019-06-26 NOTE — Progress Notes (Signed)
[place]   [phone] [phone]   Clinic Follow up Note   Patient Care Team: [name], MD as PCP - General (Family Medicine) Jovita Kussmaul, MD as Consulting Physician (General Surgery)  Date of Service:  06/28/2019  CHIEF COMPLAINT: F/u stage III colon cancer  SUMMARY OF ONCOLOGIC HISTORY: Oncology History Overview Note  Rectal cancer [facility])   Staging form: Colon and Rectum, AJCC 7th Edition   - Pathologic stage from 01/28/2016: Stage IIIC (T3, N2b, cM0) - Signed by Truitt Merle, MD on 02/12/2016    Cancer of rectosigmoid (colon) ([place])  01/03/2016 Initial Biopsy   Diagnosis Colon, biopsy, rectosigmoid junction - ADENOCARCINOMA, SEE COMMENT.   01/04/2016 Procedure   Colonoscopy by Dr. Estanislado Emms showed a partially obstructing tumor in the rectal sigmoid colon, biopsied.   01/05/2016 Imaging   CT abdomen and pelvis with contrast showed no dominant sigmoid colon mass, soft tissue fullness at the rectosigmoid junction, suspicious for the primary lesion. Borderline enlarged nodes in the sigmoid mesocolon and at the origin of the inferior mesenteric artery are indeterminate no evidence of December metastasis. Left hepatic lobe giant hemangioma.    01/28/2016 Initial Diagnosis   Rectal cancer ([place])   01/28/2016 Surgery   Endoscopic low anterior resection of rectosigmoid segmental colon for tumor    01/28/2016 Pathology Results   Invasive colonic adenocarcinoma, 4.5 cm extending into her chronic connective tissue, lymphovascular involvement by tumor, margins were negative, metastatic carcinoma in 9 of 21 lymph nodes.   02/24/2016 Imaging   CT chest w/o contrast IMPRESSION: 1. No evidence of pulmonary metastasis. 2. Stable large hepatic hemangioma.   03/08/2016 - 08/10/2016 Adjuvant Chemotherapy   Adjuvant chemotherapy with CAPOX (Xeloda 2000 mg twice daily, on day 1-14, oxaliplatin 153m/m2 on day 1) every 3 weeks,  Oxaliplatin held after 4  cycles and she had additional 4 cycles of Xeloda.    09/14/2016 Imaging   CT CAP W Contrast IMPRESSION: No evidence of locally recurrent or metastatic carcinoma. Small periumbilical hernia containing small bowel. Stable 5 cm benign hemangioma in left hepatic lobe. Stable small calcified uterine fibroid. Colonic diverticulosis. No radiographic evidence of diverticulitis.   09/14/2016 Imaging   CT CAP w contrast  IMPRESSION: No evidence of locally recurrent or metastatic carcinoma. Small periumbilical hernia containing small bowel. Stable 5 cm benign hemangioma in left hepatic lobe. Stable small calcified uterine fibroid. Colonic diverticulosis. No radiographic evidence of diverticulitis.   04/10/2017 Imaging   CT CAP W Contrast 04/10/17 IMPRESSION: 1. No findings of recurrent malignancy. The hepatic lesions have benign imaging characteristics. 2. Interval ventral hernia repair without complicating feature. 3. Other imaging findings of potential clinical significance: Aortic Atherosclerosis (ICD10-I70.0). Mild cardiomegaly. Stable scarring in both lungs. Sigmoid diverticulosis. Prominent stool throughout the colon favors constipation. Calcified anterior subserosal fibroid along the uterus.   12/27/2017 Imaging   CT CAP W Contrast 12/27/17 IMPRESSION: 1. Recurrent ventral hernia along the inferior aspect of the midline incision containing only fat. 2. Stable negative chest CT.  No acute abdominopelvic findings. 3. No evidence of metastatic disease post partial colectomy. 4. Stable hepatic hemangioma and cysts. Coronary and Aortic Atherosclerosis (ICD10-I70.0).   12/24/2018 Imaging    CT CAP W Contrast 12/24/18  IMPRESSION: 1. Stable exam. No findings of metastatic disease status post partial colectomy. 2. Unchanged liver hemangioma and liver cysts. 3.  Aortic Atherosclerosis (ICD10-I70.0).        CURRENT THERAPY:  Surveillance  INTERVAL HISTORY:  Bethany Frederick  is here  for a follow up of colon cancer. She was last seen by me 6 months ago. She presents to the clinic alone. She notes she is doing well. She has no concerns. She has received both of her [place] Vaccinations. She has not seen her PCP in over a year due to [place]. She notes since her hernia repair she feels pulling at incision site sometimes.    REVIEW OF SYSTEMS:   Constitutional: Denies fevers, chills or abnormal weight loss Eyes: Denies blurriness of vision Ears, nose, mouth, throat, and face: Denies mucositis or sore throat Respiratory: Denies cough, dyspnea or wheezes Cardiovascular: Denies palpitation, chest discomfort or lower extremity swelling Gastrointestinal:  Denies nausea, heartburn or change in bowel habits Skin: Denies abnormal skin rashes Lymphatics: Denies new lymphadenopathy or easy bruising Neurological:Denies numbness, tingling or new weaknesses Behavioral/Psych: Mood is stable, no new changes  All other systems were reviewed with the patient and are negative.  MEDICAL HISTORY:  Past Medical History:  Diagnosis Date  . Arthritis    "mainly in my back, feet, hips" (03/09/2017)  . Cancer of sigmoid colon ([place]) 2017  . Cataract, immature   . GERD (gastroesophageal reflux disease)   . Heart palpitations   . History of blood transfusion 2002   "related to endometrosis"  . 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 Seasonal allergies   . Wears partial dentures    upper front - 1 tooth    SURGICAL HISTORY: Past Surgical History:  Procedure Laterality Date  . APPENDECTOMY    . BREAST BIOPSY Left 2016  . BREAST LUMPECTOMY Left 03/2015  . BREAST LUMPECTOMY WITH RADIOACTIVE SEED LOCALIZATION Left 03/13/2015   Procedure: BREAST LUMPECTOMY WITH RADIOACTIVE SEED LOCALIZATION;  Surgeon: Autumn Messing III, MD;  Location: [place];  Service: General;  Laterality: Left;  . COLECTOMY    . COLONOSCOPY    . ENDOMETRIAL FULGURATION  06/13/2000  . HERNIA REPAIR    .  INGUINAL HERNIA REPAIR  03/05/2012   Procedure: HERNIA REPAIR INGUINAL INCARCERATED;  Surgeon: Ralene Ok, MD;  Location: [place];  Service: General;  Laterality: left;  Marland Kitchen INGUINAL HERNIA REPAIR Left   . INSERTION OF MESH  03/05/2012   Procedure: INSERTION OF MESH;  Surgeon: Ralene Ok, MD;  Location: [place];  Service: General;  Laterality: Left;  . INSERTION OF MESH N/A 03/09/2017   Procedure: INSERTION OF MESH;  Surgeon: Jovita Kussmaul, MD;  Location: [place];  Service: General;  Laterality: N/A;  . LAPAROSCOPIC ASSISTED VENTRAL HERNIA REPAIR  03/09/2017   w/mesh  . LAPAROSCOPIC SIGMOID COLECTOMY Bilateral 01/28/2016   Procedure: LAPAROSCOPIC ASSISTED SIGMOID COLECTOMY;  Surgeon: Autumn Messing III, MD;  Location: [place];  Service: General;  Laterality: Bilateral;  . MYOMECTOMY  06/13/2000  . PORT-A-CATH REMOVAL Right 03/09/2017  . PORT-A-CATH REMOVAL N/A 03/09/2017   Procedure: REMOVAL PORT-A-CATH RIGHT CHEST;  Surgeon: Jovita Kussmaul, MD;  Location: [place];  Service: General;  Laterality: N/A;  . PORTACATH PLACEMENT N/A 02/18/2016   Procedure: INSERTION PORT-A-CATH;  Surgeon: Autumn Messing III, MD;  Location: [name];  Service: General;  Laterality: N/A;  . SHOULDER ARTHROSCOPY W/ ROTATOR CUFF REPAIR Left 2013  . VENTRAL HERNIA REPAIR N/A 03/09/2017   Procedure: LAPAROSCOPIC VENTRAL HERNIA REPAIR;  Surgeon: Jovita Kussmaul, MD;  Location: [place];  Service: General;  Laterality: N/A;    I have reviewed the social history and family history with the patient and they are  unchanged from previous note.  ALLERGIES:  is allergic to no known allergies.  MEDICATIONS:  Current Outpatient Medications  Medication Sig Dispense Refill  . clobetasol cream (TEMOVATE) 2.72 % Apply 1 application topically 2 (two) times daily as needed (RASH).     . famotidine (PEPCID) 20 MG tablet Take 20 mg by mouth daily as needed for heartburn or indigestion.   0  . ibuprofen (ADVIL,MOTRIN) 200 MG tablet Take  400 mg by mouth 2 (two) times daily as needed for mild pain.    . Multiple Vitamins-Minerals (PRESERVISION AREDS 2+MULTI VIT PO) Take by mouth.    Vladimir Faster Glycol-Propyl Glycol (SYSTANE OP) Apply 1 drop to eye 2 (two) times daily.    . Vitamin D, Ergocalciferol, (DRISDOL) 50000 units CAPS capsule Take 1.25 Units by mouth once a week.     No current facility-administered medications for this visit.   Facility-Administered Medications Ordered in Other Visits  Medication Dose Route Frequency Provider Last Rate Last Admin  . sodium chloride flush (NS) 0.9 % injection 10 mL  10 mL Intravenous PRN Truitt Merle, MD   10 mL at 05/31/16 1303    PHYSICAL EXAMINATION: ECOG PERFORMANCE STATUS: 0 - Asymptomatic  Vitals:   06/28/19 1242  BP: (!) 148/75  Pulse: 98  Resp: 18  Temp: 97.8 F (36.6 C)  SpO2: 99%   Filed Weights   06/28/19 1242  Weight: 214 lb 14.4 oz (97.5 kg)    GENERAL:alert, no distress and comfortable SKIN: skin color, texture, turgor are normal, no rashes or significant lesions EYES: normal, Conjunctiva are pink and non-injected, sclera clear  NECK: supple, thyroid normal size, non-tender, without nodularity LYMPH:  no palpable lymphadenopathy in the cervical, axillary  LUNGS: clear to auscultation and percussion with normal breathing effort HEART: regular rate & rhythm and no murmurs and no lower extremity edema ABDOMEN:abdomen soft, non-tender and normal bowel sounds Musculoskeletal:no cyanosis of digits and no clubbing  NEURO: alert & oriented x 3 with fluent speech, no focal motor/sensory deficits  LABORATORY DATA:  I have reviewed the data as listed CBC Latest Ref Rng & Units 06/28/2019 12/24/2018 06/29/2018  WBC 4.0 - 10.5 K/uL 7.2 6.4 6.5  Hemoglobin 12.0 - 15.0 g/dL 15.0 14.3 14.8  Hematocrit 36.0 - 46.0 % 46.2(H) 43.7 45.4  Platelets 150 - 400 K/uL 192 196 197     CMP Latest Ref Rng & Units 06/28/2019 12/24/2018 06/29/2018  Glucose 70 - 99 mg/dL 107(H) 108(H)  102(H)  BUN 8 - 23 mg/dL 17 12 17   Creatinine 0.44 - 1.00 mg/dL 0.74 0.73 0.73  Sodium 135 - 145 mmol/L 141 141 140  Potassium 3.5 - 5.1 mmol/L 4.9 4.9 4.2  Chloride 98 - 111 mmol/L 107 106 104  CO2 22 - 32 mmol/L 26 27 24   Calcium 8.9 - 10.3 mg/dL 9.5 9.6 9.6  Total Protein 6.5 - 8.1 g/dL 7.0 6.7 6.9  Total Bilirubin 0.3 - 1.2 mg/dL 0.4 0.4 0.5  Alkaline Phos 38 - 126 U/L 137(H) 139(H) 180(H)  AST 15 - 41 U/L 15 18 18   ALT 0 - 44 U/L 16 17 18       RADIOGRAPHIC STUDIES: I have personally reviewed the radiological images as listed and agreed with the findings in the report. No results found.   ASSESSMENT & PLAN:  Bethany Frederick is a 69 y.o. female with    1. Colorectal cancer, invasive adenocarcinoma,pT3N2bM0, stage IIIC, MSI-stable -She was diagnosed in 12/2015. She is s/p surgical resection  and adjuvant CAPOX.  -Hersurveillancescans have been NED since then.  -She has had a repeat colonoscopy with Dr. Watt Climes since her surgery. Repeat 3 years later.  -She is clinically doing well. Lab reviewed, her CBC and CMP are within normal limits. Her CEA still pending. Her physical exam unremarkable. There is no clinical concern for recurrence. -She is 3.5 years since her diagnosis and her risk of recurrence has significantly decreased. I do not plans to do anymore surveillance scan unless she has concerning symptoms. Continue 5 years surveillance.  -f/u in 8 months then 1 year later.  -She has received both her COVID19 vaccinations.   2. 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 This is considered a benign breast lesion, not high risk for breast cancer. -Continue annual screening mammogram, self exam and routine follow-up.  3. Overweight, Fatigue  -Shepreviouslygained 20 poundsover 9 monthsspan, mostly related to diet and less physical activities. -She does  continue to gain weight since last visit.  -I again encouraged her to work on losing weight with exercise and a healthy diet.   4. Vertigo  -Intermittently. Last time 3-4 months ago. She can use OTC Meclizine if needed.  -I suggest she follow up with ENT.   5. Elevated BP  -her BP has been slightly elevated since 2020. Usually normal at home per patient, no history of hypertension -She denies history of HTN, not on medication  -I encouraged her to obtain BP cuff and monitor at home. I also recommend she reduce salt intake and increase activity with exercise.  -I recommend she f/u with her PCP, has not been seen in over 1 year.    Plan -F/u with Dr. Watt Climes and colonoscopy if due this year  -F/U with PCP  -She is clinically doing well  -Lab and f/u in 8 months   No problem-specific Assessment & Plan notes found for this encounter.   No orders of the defined types were placed in this encounter.  All questions were answered. The patient knows to call the clinic with any problems, questions or concerns. No barriers to learning was detected. The total time spent in the appointment was 25 minutes.     Truitt Merle, MD 06/28/2019   I, Joslyn Devon, am acting as scribe for Truitt Merle, MD.   I have reviewed the above documentation for accuracy and completeness, and I agree with the above.

## 2019-06-28 ENCOUNTER — Other Ambulatory Visit: Payer: Self-pay

## 2019-06-28 ENCOUNTER — Inpatient Hospital Stay (HOSPITAL_BASED_OUTPATIENT_CLINIC_OR_DEPARTMENT_OTHER): Payer: Federal, State, Local not specified - PPO | Admitting: Hematology

## 2019-06-28 ENCOUNTER — Encounter: Payer: Self-pay | Admitting: Hematology

## 2019-06-28 ENCOUNTER — Inpatient Hospital Stay: Payer: Federal, State, Local not specified - PPO | Attending: Hematology

## 2019-06-28 VITALS — BP 148/75 | HR 98 | Temp 97.8°F | Resp 18 | Ht 67.0 in | Wt 214.9 lb

## 2019-06-28 DIAGNOSIS — C186 Malignant neoplasm of descending colon: Secondary | ICD-10-CM

## 2019-06-28 DIAGNOSIS — R030 Elevated blood-pressure reading, without diagnosis of hypertension: Secondary | ICD-10-CM | POA: Diagnosis not present

## 2019-06-28 DIAGNOSIS — C19 Malignant neoplasm of rectosigmoid junction: Secondary | ICD-10-CM

## 2019-06-28 DIAGNOSIS — E663 Overweight: Secondary | ICD-10-CM | POA: Diagnosis not present

## 2019-06-28 DIAGNOSIS — C20 Malignant neoplasm of rectum: Secondary | ICD-10-CM

## 2019-06-28 LAB — CBC WITH DIFFERENTIAL/PLATELET
Abs Immature Granulocytes: 0.02 10*3/uL (ref 0.00–0.07)
Basophils Absolute: 0.1 10*3/uL (ref 0.0–0.1)
Basophils Relative: 1 %
Eosinophils Absolute: 0.2 10*3/uL (ref 0.0–0.5)
Eosinophils Relative: 3 %
HCT: 46.2 % — ABNORMAL HIGH (ref 36.0–46.0)
Hemoglobin: 15 g/dL (ref 12.0–15.0)
Immature Granulocytes: 0 %
Lymphocytes Relative: 26 %
Lymphs Abs: 1.9 10*3/uL (ref 0.7–4.0)
MCH: 29.4 pg (ref 26.0–34.0)
MCHC: 32.5 g/dL (ref 30.0–36.0)
MCV: 90.6 fL (ref 80.0–100.0)
Monocytes Absolute: 0.5 10*3/uL (ref 0.1–1.0)
Monocytes Relative: 7 %
Neutro Abs: 4.4 10*3/uL (ref 1.7–7.7)
Neutrophils Relative %: 63 %
Platelets: 192 10*3/uL (ref 150–400)
RBC: 5.1 MIL/uL (ref 3.87–5.11)
RDW: 13.8 % (ref 11.5–15.5)
WBC: 7.2 10*3/uL (ref 4.0–10.5)
nRBC: 0 % (ref 0.0–0.2)

## 2019-06-28 LAB — COMPREHENSIVE METABOLIC PANEL
ALT: 16 U/L (ref 0–44)
AST: 15 U/L (ref 15–41)
Albumin: 4.1 g/dL (ref 3.5–5.0)
Alkaline Phosphatase: 137 U/L — ABNORMAL HIGH (ref 38–126)
Anion gap: 8 (ref 5–15)
BUN: 17 mg/dL (ref 8–23)
CO2: 26 mmol/L (ref 22–32)
Calcium: 9.5 mg/dL (ref 8.9–10.3)
Chloride: 107 mmol/L (ref 98–111)
Creatinine, Ser: 0.74 mg/dL (ref 0.44–1.00)
GFR calc Af Amer: >60 mL/min (ref >60)
GFR calc non Af Amer: >60 mL/min (ref >60)
Glucose, Bld: 107 mg/dL — ABNORMAL HIGH (ref 70–99)
Potassium: 4.9 mmol/L (ref 3.5–5.1)
Sodium: 141 mmol/L (ref 135–145)
Total Bilirubin: 0.4 mg/dL (ref 0.3–1.2)
Total Protein: 7 g/dL (ref 6.5–8.1)

## 2019-06-28 LAB — CEA (IN HOUSE-CHCC): CEA (CHCC-In House): 1.52 ng/mL (ref 0.00–5.00)

## 2019-06-29 ENCOUNTER — Encounter: Payer: Self-pay | Admitting: Hematology

## 2019-07-01 ENCOUNTER — Telehealth: Payer: Self-pay | Admitting: Hematology

## 2019-07-01 NOTE — Telephone Encounter (Signed)
I talk with patient regarding schedule  

## 2019-07-30 DIAGNOSIS — C44712 Basal cell carcinoma of skin of right lower limb, including hip: Secondary | ICD-10-CM | POA: Diagnosis not present

## 2019-10-08 DIAGNOSIS — H04123 Dry eye syndrome of bilateral lacrimal glands: Secondary | ICD-10-CM | POA: Diagnosis not present

## 2019-10-08 DIAGNOSIS — H40023 Open angle with borderline findings, high risk, bilateral: Secondary | ICD-10-CM | POA: Diagnosis not present

## 2019-10-15 ENCOUNTER — Other Ambulatory Visit: Payer: Self-pay

## 2019-10-15 ENCOUNTER — Ambulatory Visit
Admission: RE | Admit: 2019-10-15 | Discharge: 2019-10-15 | Disposition: A | Discharge status: Home / Self Care | Payer: Federal, State, Local not specified - PPO | Source: Ambulatory Visit | Attending: Sports Medicine | Admitting: Sports Medicine

## 2019-10-15 ENCOUNTER — Ambulatory Visit (INDEPENDENT_AMBULATORY_CARE_PROVIDER_SITE_OTHER): Payer: Medicare Other | Admitting: Sports Medicine

## 2019-10-15 VITALS — BP 126/92 | Ht 66.0 in | Wt 213.0 lb

## 2019-10-15 DIAGNOSIS — M79671 Pain in right foot: Secondary | ICD-10-CM

## 2019-10-15 DIAGNOSIS — M79672 Pain in left foot: Secondary | ICD-10-CM | POA: Diagnosis not present

## 2019-10-15 NOTE — Progress Notes (Signed)
° °  Bethany Frederick is a 69 y.o. female who presents to [facility] today for the following:  Bilateral foot pain Patient presenting as initial visit for bilateral foot pain.  States it has been present daily for years.  States that it is a burning sensation.  Left is greater than right.  States that her feet feel tired and burning.  States that they feel stiff.  After a day of walking in her tennis shoes and sandals she states that when she takes them off she cannot walk due to pain.  States that the outside of her left foot feels "broken" due to pain.  States that pain is present in the a.m. when she wakes up but it worsens throughout the day.  Does have a history of plantar fasciitis in her right foot and burning sensation feels somewhat similar.  Was given exercises and told to ice the area by orthopedist at that time of diagnosis.  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  Does also have chronic edema which she was told was due to a valvular/main issue by her PCP.  Does note that recently her left foot pain became worse after having new shoes, new balance.   PMH reviewed. Cancer of rectosigmoid ROS as above. Medications reviewed.  Exam:  BP (!) 126/92    Ht '5\' 6"'$  (1.676 m)    Wt 213 lb (96.6 kg)    BMI 34.38 kg/m  Gen: Well NAD MSK: Foot: Inspection:  No obvious bony deformity.  2+ edema to shins bilaterally, no erythema, or bruising.  Flattened longitudinal arch. Pronation of feed bilaterally  Palpation: TTP of calcaneous bilaterally and left lateral foot along 5th met ROM: Full  ROM of the ankle. Normal midfoot flexibility Strength: 5/5 strength ankle in all planes Neurovascular: N/V intact distally in the lower extremity Special tests: Negative anterior drawer. Negative squeeze. normal midfoot flexibility. Normal calcaneal motion with heel raise   Assessment and Plan: 1) Foot pain, bilateral Patient presenting with bilateral foot pain.  Likely plantar fasciitis as  patient reports burning sensation similar to her previous plantar fasciitis history.  Of note patient does have flattened longitudinal arches as well as some pronation of feet bilaterally.  I anticipate this is likely a contributing factor to her foot pain.  Patient would likely benefit from orthotics.  We discussed using green inserts with a scaphoid pad.  Patient will bring in her new balance tennis shoes tomorrow to clinic for [place] to have green inserts and scaphoid pad inserted.  Given new onset left calcaneal pain that radiates to fifth met we will also obtain x-ray imaging to rule out any fractures.  Will obtain right foot x-ray and left 2 view calcaneus.  Patient to follow-up if no improvement.   Dalphine Handing, PGY-3 [facility] Resident  10/15/2019 2:11 PM   Patient seen and evaluated with the resident.  I agree with the above plan of care.  X-rays of the left calcaneus show no obvious stress fracture.  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  She will return to the office tomorrow with her sneakers and we will fit them with green sports insoles and a scaphoid pad.  If she finds these to be helpful then she may be a candidate for custom orthotics at a later date.  Follow-up 4 weeks later for reevaluation.

## 2019-10-15 NOTE — Assessment & Plan Note (Signed)
Patient presenting with bilateral foot pain.  Likely plantar fasciitis as patient reports burning sensation similar to her previous plantar fasciitis history.  Of note patient does have flattened longitudinal arches as well as some pronation of feet bilaterally.  I anticipate this is likely a contributing factor to her foot pain.  Patient would likely benefit from orthotics.  We discussed using green inserts with a scaphoid pad.  Patient will bring in her new balance tennis shoes tomorrow to clinic for [place] to have green inserts and scaphoid pad inserted.  Given new onset left calcaneal pain that radiates to fifth met we will also obtain x-ray imaging to rule out any fractures.  Will obtain right foot x-ray and left 2 view calcaneus.  Patient to follow-up if no improvement.

## 2019-10-28 ENCOUNTER — Other Ambulatory Visit: Payer: Self-pay | Admitting: Pharmacist

## 2020-02-21 NOTE — Progress Notes (Signed)
Bethany Frederick   [phone] Fax:([phone]   Clinic Follow up Note   Patient Care Team: [name], MD as PCP - General (Family Medicine) Jovita Kussmaul, MD as Consulting Physician (General Surgery)  Date of Service:  02/26/2020  CHIEF COMPLAINT: F/u stage III colon cancer  SUMMARY OF ONCOLOGIC HISTORY: Oncology History Overview Note  Rectal cancer [facility])   Staging form: Colon and Rectum, AJCC 7th Edition   - Pathologic stage from 01/28/2016: Stage IIIC (T3, N2b, cM0) - Signed by Truitt Merle, MD on 02/12/2016    Cancer of rectosigmoid (colon) ([place])  01/03/2016 Initial Biopsy   Diagnosis Colon, biopsy, rectosigmoid junction - ADENOCARCINOMA, SEE COMMENT.   01/04/2016 Procedure   Colonoscopy by Dr. Estanislado Emms showed a partially obstructing tumor in the rectal sigmoid colon, biopsied.   01/05/2016 Imaging   CT abdomen and pelvis with contrast showed no dominant sigmoid colon mass, soft tissue fullness at the rectosigmoid junction, suspicious for the primary lesion. Borderline enlarged nodes in the sigmoid mesocolon and at the origin of the inferior mesenteric artery are indeterminate no evidence of December metastasis. Left hepatic lobe giant hemangioma.    01/28/2016 Initial Diagnosis   Rectal cancer ([place])   01/28/2016 Surgery   Endoscopic low anterior resection of rectosigmoid segmental colon for tumor    01/28/2016 Pathology Results   Invasive colonic adenocarcinoma, 4.5 cm extending into her chronic connective tissue, lymphovascular involvement by tumor, margins were negative, metastatic carcinoma in 9 of 21 lymph nodes.   02/24/2016 Imaging   CT chest w/o contrast IMPRESSION: 1. No evidence of pulmonary metastasis. 2. Stable large hepatic hemangioma.   03/08/2016 - 08/10/2016 Adjuvant Chemotherapy   Adjuvant chemotherapy with CAPOX (Xeloda 2000 mg twice daily, on day 1-14, oxaliplatin 140m/m2 on day 1) every 3 weeks,  Oxaliplatin held after 4  cycles and she had additional 4 cycles of Xeloda.    09/14/2016 Imaging   CT CAP W Contrast IMPRESSION: No evidence of locally recurrent or metastatic carcinoma. Small periumbilical hernia containing small bowel. Stable 5 cm benign hemangioma in left hepatic lobe. Stable small calcified uterine fibroid. Colonic diverticulosis. No radiographic evidence of diverticulitis.   09/14/2016 Imaging   CT CAP w contrast  IMPRESSION: No evidence of locally recurrent or metastatic carcinoma. Small periumbilical hernia containing small bowel. Stable 5 cm benign hemangioma in left hepatic lobe. Stable small calcified uterine fibroid. Colonic diverticulosis. No radiographic evidence of diverticulitis.   04/10/2017 Imaging   CT CAP W Contrast 04/10/17 IMPRESSION: 1. No findings of recurrent malignancy. The hepatic lesions have benign imaging characteristics. 2. Interval ventral hernia repair without complicating feature. 3. Other imaging findings of potential clinical significance: Aortic Atherosclerosis (ICD10-I70.0). Mild cardiomegaly. Stable scarring in both lungs. Sigmoid diverticulosis. Prominent stool throughout the colon favors constipation. Calcified anterior subserosal fibroid along the uterus.   12/27/2017 Imaging   CT CAP W Contrast 12/27/17 IMPRESSION: 1. Recurrent ventral hernia along the inferior aspect of the midline incision containing only fat. 2. Stable negative chest CT.  No acute abdominopelvic findings. 3. No evidence of metastatic disease post partial colectomy. 4. Stable hepatic hemangioma and cysts. Coronary and Aortic Atherosclerosis (ICD10-I70.0).   12/24/2018 Imaging    CT CAP W Contrast 12/24/18  IMPRESSION: 1. Stable exam. No findings of metastatic disease status post partial colectomy. 2. Unchanged liver hemangioma and liver cysts. 3.  Aortic Atherosclerosis (ICD10-I70.0).        CURRENT THERAPY:  Surveillance  INTERVAL HISTORY:  Bethany Frederick  is here  for a follow up of colon cancer. She was last seen by me 8 months ago. She presents to the clinic alone. She notes she is dong well. She denies any major new changes. She has her colonoscopy with [facility] last week and results were good. She notes fatigue from her age. She is still able to do all activities she wants to. She notes any stomach bloating or pain is related to what she eats and not lasting. She denies Upper respiratory issues.     REVIEW OF SYSTEMS:   Constitutional: Denies fevers, chills or abnormal weight loss Eyes: Denies blurriness of vision Ears, nose, mouth, throat, and face: Denies mucositis or sore throat Respiratory: Denies cough, dyspnea or wheezes Cardiovascular: Denies palpitation, chest discomfort or lower extremity swelling Gastrointestinal:  Denies nausea, heartburn or change in bowel habits Skin: Denies abnormal skin rashes Lymphatics: Denies new lymphadenopathy or easy bruising Neurological:Denies numbness, tingling or new weaknesses Behavioral/Psych: Mood is stable, no new changes  All other systems were reviewed with the patient and are negative.  MEDICAL HISTORY:  Past Medical History:  Diagnosis Date  . Arthritis    "mainly in my back, feet, hips" (03/09/2017)  . Cancer of sigmoid colon ([place]) 2017  . Cataract, immature   . GERD (gastroesophageal reflux disease)   . Heart palpitations   . History of blood transfusion 2002   "related to endometrosis"  . 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 Seasonal allergies   . Wears partial dentures    upper front - 1 tooth    SURGICAL HISTORY: Past Surgical History:  Procedure Laterality Date  . APPENDECTOMY    . BREAST BIOPSY Left 2016  . BREAST LUMPECTOMY Left 03/2015  . BREAST LUMPECTOMY WITH RADIOACTIVE SEED LOCALIZATION Left 03/13/2015   Procedure: BREAST LUMPECTOMY WITH RADIOACTIVE SEED LOCALIZATION;  Surgeon: Autumn Messing III, MD;  Location: [place];  Service: General;   Laterality: Left;  . COLECTOMY    . COLONOSCOPY    . ENDOMETRIAL FULGURATION  06/13/2000  . HERNIA REPAIR    . INGUINAL HERNIA REPAIR  03/05/2012   Procedure: HERNIA REPAIR INGUINAL INCARCERATED;  Surgeon: Ralene Ok, MD;  Location: [place];  Service: General;  Laterality: left;  Marland Kitchen INGUINAL HERNIA REPAIR Left   . INSERTION OF MESH  03/05/2012   Procedure: INSERTION OF MESH;  Surgeon: Ralene Ok, MD;  Location: [place];  Service: General;  Laterality: Left;  . INSERTION OF MESH N/A 03/09/2017   Procedure: INSERTION OF MESH;  Surgeon: Jovita Kussmaul, MD;  Location: [place];  Service: General;  Laterality: N/A;  . LAPAROSCOPIC ASSISTED VENTRAL HERNIA REPAIR  03/09/2017   w/mesh  . LAPAROSCOPIC SIGMOID COLECTOMY Bilateral 01/28/2016   Procedure: LAPAROSCOPIC ASSISTED SIGMOID COLECTOMY;  Surgeon: Autumn Messing III, MD;  Location: [place];  Service: General;  Laterality: Bilateral;  . MYOMECTOMY  06/13/2000  . PORT-A-CATH REMOVAL Right 03/09/2017  . PORT-A-CATH REMOVAL N/A 03/09/2017   Procedure: REMOVAL PORT-A-CATH RIGHT CHEST;  Surgeon: Jovita Kussmaul, MD;  Location: [place];  Service: General;  Laterality: N/A;  . PORTACATH PLACEMENT N/A 02/18/2016   Procedure: INSERTION PORT-A-CATH;  Surgeon: Autumn Messing III, MD;  Location: [place];  Service: General;  Laterality: N/A;  . SHOULDER ARTHROSCOPY W/ ROTATOR CUFF REPAIR Left 2013  . VENTRAL HERNIA REPAIR N/A 03/09/2017   Procedure: LAPAROSCOPIC VENTRAL HERNIA REPAIR;  Surgeon: Jovita Kussmaul, MD;  Location: [place];  Service: General;  Laterality: N/A;    I have reviewed the social history and family history with the patient and they are unchanged from previous note.  ALLERGIES:  is allergic to no known allergies.  MEDICATIONS:  Current Outpatient Medications  Medication Sig Dispense Refill  . clobetasol cream (TEMOVATE) 3.83 % Apply 1 application topically 2 (two) times daily as needed (RASH).     . famotidine (PEPCID) 20 MG tablet  Take 20 mg by mouth daily as needed for heartburn or indigestion.   0  . ibuprofen (ADVIL,MOTRIN) 200 MG tablet Take 400 mg by mouth 2 (two) times daily as needed for mild pain.    . metFORMIN (GLUCOPHAGE-XR) 500 MG 24 hr tablet Take 500 mg by mouth daily.    . Multiple Vitamins-Minerals (PRESERVISION AREDS 2+MULTI VIT PO) Take by mouth.    Vladimir Faster Glycol-Propyl Glycol (SYSTANE OP) Apply 1 drop to eye 2 (two) times daily.    . Vitamin D, Ergocalciferol, (DRISDOL) 50000 units CAPS capsule Take 1.25 Units by mouth once a week.     No current facility-administered medications for this visit.   Facility-Administered Medications Ordered in Other Visits  Medication Dose Route Frequency Provider Last Rate Last Admin  . sodium chloride flush (NS) 0.9 % injection 10 mL  10 mL Intravenous PRN Truitt Merle, MD   10 mL at 05/31/16 1303    PHYSICAL EXAMINATION: ECOG PERFORMANCE STATUS: 0 - Asymptomatic  Vitals:   02/26/20 1253  BP: (!) 148/97  Pulse: 70  Resp: 18  Temp: 98.1 F (36.7 C)  SpO2: 99%   Filed Weights   02/26/20 1253  Weight: 213 lb 14.4 oz (97 kg)    GENERAL:alert, no distress and comfortable SKIN: skin color, texture, turgor are normal, no rashes or significant lesions EYES: normal, Conjunctiva are pink and non-injected, sclera clear  NECK: supple, thyroid normal size, non-tender, without nodularity LYMPH:  no palpable lymphadenopathy in the cervical, axillary  LUNGS: clear to auscultation and percussion with normal breathing effort HEART: regular rate & rhythm and no murmurs and no lower extremity edema ABDOMEN:abdomen soft, non-tender and normal bowel sounds. No hepatomegaly Musculoskeletal:no cyanosis of digits and no clubbing  NEURO: alert & oriented x 3 with fluent speech, no focal motor/sensory deficits  LABORATORY DATA:  I have reviewed the data as listed CBC Latest Ref Rng & Units 02/26/2020 06/28/2019 12/24/2018  WBC 4.0 - 10.5 K/uL 6.9 7.2 6.4  Hemoglobin 12.0  - 15.0 g/dL 13.7 15.0 14.3  Hematocrit 36 - 46 % 42.5 46.2(H) 43.7  Platelets 150 - 400 K/uL 193 192 196     CMP Latest Ref Rng & Units 02/26/2020 06/28/2019 12/24/2018  Glucose 70 - 99 mg/dL 102(H) 107(H) 108(H)  BUN 8 - 23 mg/dL 15 17 12   Creatinine 0.44 - 1.00 mg/dL 0.71 0.74 0.73  Sodium 135 - 145 mmol/L 140 141 141  Potassium 3.5 - 5.1 mmol/L 4.5 4.9 4.9  Chloride 98 - 111 mmol/L 107 107 106  CO2 22 - 32 mmol/L 25 26 27   Calcium 8.9 - 10.3 mg/dL 9.4 9.5 9.6  Total Protein 6.5 - 8.1 g/dL 6.1(L) 7.0 6.7  Total Bilirubin 0.3 - 1.2 mg/dL 0.4 0.4 0.4  Alkaline Phos 38 - 126 U/L 133(H) 137(H) 139(H)  AST 15 - 41 U/L 17 15 18   ALT 0 - 44 U/L 19 16 17       RADIOGRAPHIC STUDIES: I have personally reviewed the radiological images as listed and agreed with the findings in the report.  No results found.   ASSESSMENT & PLAN:  Bethany Frederick is a 69 y.o. female with    1. Colorectal cancer, invasive adenocarcinoma,pT3N2bM0, stage IIIC, MSI-stable -She was diagnosed in 12/2015. She is s/p surgical resection and adjuvant CAPOX.  -Hersurveillancescans have been NED since then.  -She has had a repeat colonoscopy with Dr. Watt Climes since her surgery. Repeat 3 years later.  -She is clinically doing well. Lab reviewed, her CBC and CMP are within normal limits . Her CEA still pending. Her physical exam unremarkable. There is no clinical concern for recurrence. -Per pt her latest colonoscopy in 102021 was good. She is 4 years since her cancer diagnosis. Her risk of recurrence has significantly decreased. I do not plans to do anymore surveillance scan unless she has concerning symptoms. Continue 5 years surveillance.  -f/u in 1 year  -She has received both her COVID19 vaccinations.   2. 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 -Continue annual screening mammogram, self exam and routine  follow-up.  3. Overweight, Fatigue  -Weight stable in 2021. I encouraged her to work on losing weight with exercise and a healthy diet.   4. Vertigo  -Intermittently. She can use OTC Meclizine if needed. I suggested she follow up with ENT.   5. Elevated BP  -her BP has been slightly elevated since 2020.  -I recommend she f/u with PCP and monitor BP at home. I encouraged her to manage her weight and reduce salt intake.  -BP at 148/97 today (02/26/20)    Plan -Lab and f/u in 1 year with NP Lacie  -Flu shot today   No problem-specific Assessment & Plan notes found for this encounter.   No orders of the defined types were placed in this encounter.  All questions were answered. The patient knows to call the clinic with any problems, questions or concerns. No barriers to learning was detected. The total time spent in the appointment was 25 minutes.     Truitt Merle, MD 02/26/2020   I, Joslyn Devon, am acting as scribe for Truitt Merle, MD.   I have reviewed the above documentation for accuracy and completeness, and I agree with the above.

## 2020-02-26 ENCOUNTER — Inpatient Hospital Stay (HOSPITAL_BASED_OUTPATIENT_CLINIC_OR_DEPARTMENT_OTHER): Payer: Medicare Other | Admitting: Hematology

## 2020-02-26 ENCOUNTER — Inpatient Hospital Stay: Payer: Medicare Other | Attending: Hematology

## 2020-02-26 ENCOUNTER — Other Ambulatory Visit: Payer: Self-pay

## 2020-02-26 ENCOUNTER — Encounter: Payer: Self-pay | Admitting: Hematology

## 2020-02-26 VITALS — BP 148/97 | HR 70 | Temp 98.1°F | Resp 18 | Ht 66.0 in | Wt 213.9 lb

## 2020-02-26 DIAGNOSIS — C186 Malignant neoplasm of descending colon: Secondary | ICD-10-CM

## 2020-02-26 DIAGNOSIS — Z23 Encounter for immunization: Secondary | ICD-10-CM | POA: Diagnosis not present

## 2020-02-26 DIAGNOSIS — C20 Malignant neoplasm of rectum: Secondary | ICD-10-CM

## 2020-02-26 DIAGNOSIS — C19 Malignant neoplasm of rectosigmoid junction: Secondary | ICD-10-CM | POA: Diagnosis present

## 2020-02-26 LAB — CBC WITH DIFFERENTIAL/PLATELET
Abs Immature Granulocytes: 0.02 10*3/uL (ref 0.00–0.07)
Basophils Absolute: 0.1 10*3/uL (ref 0.0–0.1)
Basophils Relative: 1 %
Eosinophils Absolute: 0.2 10*3/uL (ref 0.0–0.5)
Eosinophils Relative: 3 %
HCT: 42.5 % (ref 36.0–46.0)
Hemoglobin: 13.7 g/dL (ref 12.0–15.0)
Immature Granulocytes: 0 %
Lymphocytes Relative: 24 %
Lymphs Abs: 1.6 10*3/uL (ref 0.7–4.0)
MCH: 28.4 pg (ref 26.0–34.0)
MCHC: 32.2 g/dL (ref 30.0–36.0)
MCV: 88 fL (ref 80.0–100.0)
Monocytes Absolute: 0.5 10*3/uL (ref 0.1–1.0)
Monocytes Relative: 7 %
Neutro Abs: 4.5 10*3/uL (ref 1.7–7.7)
Neutrophils Relative %: 65 %
Platelets: 193 10*3/uL (ref 150–400)
RBC: 4.83 MIL/uL (ref 3.87–5.11)
RDW: 14.2 % (ref 11.5–15.5)
WBC: 6.9 10*3/uL (ref 4.0–10.5)
nRBC: 0 % (ref 0.0–0.2)

## 2020-02-26 LAB — COMPREHENSIVE METABOLIC PANEL
ALT: 19 U/L (ref 0–44)
AST: 17 U/L (ref 15–41)
Albumin: 3.8 g/dL (ref 3.5–5.0)
Alkaline Phosphatase: 133 U/L — ABNORMAL HIGH (ref 38–126)
Anion gap: 8 (ref 5–15)
BUN: 15 mg/dL (ref 8–23)
CO2: 25 mmol/L (ref 22–32)
Calcium: 9.4 mg/dL (ref 8.9–10.3)
Chloride: 107 mmol/L (ref 98–111)
Creatinine, Ser: 0.71 mg/dL (ref 0.44–1.00)
GFR, Estimated: >60 mL/min (ref >60)
Glucose, Bld: 102 mg/dL — ABNORMAL HIGH (ref 70–99)
Potassium: 4.5 mmol/L (ref 3.5–5.1)
Sodium: 140 mmol/L (ref 135–145)
Total Bilirubin: 0.4 mg/dL (ref 0.3–1.2)
Total Protein: 6.1 g/dL — ABNORMAL LOW (ref 6.5–8.1)

## 2020-02-26 LAB — CEA (IN HOUSE-CHCC): CEA (CHCC-In House): 1.4 ng/mL (ref 0.00–5.00)

## 2020-02-26 MED ORDER — INFLUENZA VAC A&B SA ADJ QUAD 0.5 ML IM PRSY
PREFILLED_SYRINGE | INTRAMUSCULAR | Status: AC
  Filled 2020-02-26: qty 0.5

## 2020-02-26 MED ORDER — INFLUENZA VAC A&B SA ADJ QUAD 0.5 ML IM PRSY
0.5000 mL | PREFILLED_SYRINGE | Freq: Once | INTRAMUSCULAR | Status: AC
  Administered 2020-02-26: 0.5 mL via INTRAMUSCULAR

## 2020-05-14 DIAGNOSIS — Z03818 Encounter for observation for suspected exposure to other biological agents ruled out: Secondary | ICD-10-CM | POA: Diagnosis not present

## 2020-06-11 DIAGNOSIS — Z1231 Encounter for screening mammogram for malignant neoplasm of breast: Secondary | ICD-10-CM | POA: Diagnosis not present

## 2020-06-11 DIAGNOSIS — Z6834 Body mass index (BMI) 34.0-34.9, adult: Secondary | ICD-10-CM | POA: Diagnosis not present

## 2020-06-11 DIAGNOSIS — Z01419 Encounter for gynecological examination (general) (routine) without abnormal findings: Secondary | ICD-10-CM | POA: Diagnosis not present

## 2020-06-12 DIAGNOSIS — H5203 Hypermetropia, bilateral: Secondary | ICD-10-CM | POA: Diagnosis not present

## 2020-06-12 DIAGNOSIS — H40023 Open angle with borderline findings, high risk, bilateral: Secondary | ICD-10-CM | POA: Diagnosis not present

## 2020-06-12 DIAGNOSIS — H04123 Dry eye syndrome of bilateral lacrimal glands: Secondary | ICD-10-CM | POA: Diagnosis not present

## 2020-06-12 DIAGNOSIS — H52223 Regular astigmatism, bilateral: Secondary | ICD-10-CM | POA: Diagnosis not present

## 2020-06-12 DIAGNOSIS — H2513 Age-related nuclear cataract, bilateral: Secondary | ICD-10-CM | POA: Diagnosis not present

## 2020-06-12 DIAGNOSIS — H524 Presbyopia: Secondary | ICD-10-CM | POA: Diagnosis not present

## 2020-06-12 DIAGNOSIS — H353131 Nonexudative age-related macular degeneration, bilateral, early dry stage: Secondary | ICD-10-CM | POA: Diagnosis not present

## 2020-06-30 ENCOUNTER — Telehealth: Payer: Self-pay | Admitting: Nurse Practitioner

## 2020-06-30 NOTE — Telephone Encounter (Signed)
Moved upcoming appointment due to provider's template. Patient is aware of changes. 

## 2020-07-06 DIAGNOSIS — E559 Vitamin D deficiency, unspecified: Secondary | ICD-10-CM | POA: Diagnosis not present

## 2020-07-06 DIAGNOSIS — M454 Ankylosing spondylitis of thoracic region: Secondary | ICD-10-CM | POA: Diagnosis not present

## 2020-07-06 DIAGNOSIS — Z0001 Encounter for general adult medical examination with abnormal findings: Secondary | ICD-10-CM | POA: Diagnosis not present

## 2020-07-06 DIAGNOSIS — M436 Torticollis: Secondary | ICD-10-CM | POA: Diagnosis not present

## 2020-07-06 DIAGNOSIS — M25572 Pain in left ankle and joints of left foot: Secondary | ICD-10-CM | POA: Diagnosis not present

## 2020-07-06 DIAGNOSIS — M479 Spondylosis, unspecified: Secondary | ICD-10-CM | POA: Diagnosis not present

## 2020-07-27 DIAGNOSIS — Z0001 Encounter for general adult medical examination with abnormal findings: Secondary | ICD-10-CM | POA: Diagnosis not present

## 2020-07-27 DIAGNOSIS — H60541 Acute eczematoid otitis externa, right ear: Secondary | ICD-10-CM | POA: Diagnosis not present

## 2020-07-27 DIAGNOSIS — Z0000 Encounter for general adult medical examination without abnormal findings: Secondary | ICD-10-CM | POA: Diagnosis not present

## 2020-07-27 DIAGNOSIS — F411 Generalized anxiety disorder: Secondary | ICD-10-CM | POA: Diagnosis not present

## 2020-07-27 DIAGNOSIS — Z6833 Body mass index (BMI) 33.0-33.9, adult: Secondary | ICD-10-CM | POA: Diagnosis not present

## 2020-07-27 DIAGNOSIS — M454 Ankylosing spondylitis of thoracic region: Secondary | ICD-10-CM | POA: Diagnosis not present

## 2020-07-27 DIAGNOSIS — L299 Pruritus, unspecified: Secondary | ICD-10-CM | POA: Diagnosis not present

## 2020-07-27 DIAGNOSIS — E559 Vitamin D deficiency, unspecified: Secondary | ICD-10-CM | POA: Diagnosis not present

## 2020-09-16 DIAGNOSIS — L814 Other melanin hyperpigmentation: Secondary | ICD-10-CM | POA: Diagnosis not present

## 2020-09-16 DIAGNOSIS — Z85828 Personal history of other malignant neoplasm of skin: Secondary | ICD-10-CM | POA: Diagnosis not present

## 2020-09-16 DIAGNOSIS — D2372 Other benign neoplasm of skin of left lower limb, including hip: Secondary | ICD-10-CM | POA: Diagnosis not present

## 2020-09-16 DIAGNOSIS — D1801 Hemangioma of skin and subcutaneous tissue: Secondary | ICD-10-CM | POA: Diagnosis not present

## 2020-09-16 DIAGNOSIS — D2271 Melanocytic nevi of right lower limb, including hip: Secondary | ICD-10-CM | POA: Diagnosis not present

## 2020-09-16 DIAGNOSIS — L218 Other seborrheic dermatitis: Secondary | ICD-10-CM | POA: Diagnosis not present

## 2020-09-16 DIAGNOSIS — L821 Other seborrheic keratosis: Secondary | ICD-10-CM | POA: Diagnosis not present

## 2020-09-16 DIAGNOSIS — D2272 Melanocytic nevi of left lower limb, including hip: Secondary | ICD-10-CM | POA: Diagnosis not present

## 2020-09-16 DIAGNOSIS — D225 Melanocytic nevi of trunk: Secondary | ICD-10-CM | POA: Diagnosis not present

## 2020-09-25 DIAGNOSIS — M25572 Pain in left ankle and joints of left foot: Secondary | ICD-10-CM | POA: Diagnosis not present

## 2020-09-25 DIAGNOSIS — M7672 Peroneal tendinitis, left leg: Secondary | ICD-10-CM | POA: Diagnosis not present

## 2020-09-25 DIAGNOSIS — M79672 Pain in left foot: Secondary | ICD-10-CM | POA: Diagnosis not present

## 2020-10-16 DIAGNOSIS — H40023 Open angle with borderline findings, high risk, bilateral: Secondary | ICD-10-CM | POA: Diagnosis not present

## 2020-12-01 DIAGNOSIS — C189 Malignant neoplasm of colon, unspecified: Secondary | ICD-10-CM | POA: Diagnosis not present

## 2020-12-01 DIAGNOSIS — E559 Vitamin D deficiency, unspecified: Secondary | ICD-10-CM | POA: Diagnosis not present

## 2020-12-01 DIAGNOSIS — K219 Gastro-esophageal reflux disease without esophagitis: Secondary | ICD-10-CM | POA: Diagnosis not present

## 2020-12-01 DIAGNOSIS — Z0001 Encounter for general adult medical examination with abnormal findings: Secondary | ICD-10-CM | POA: Diagnosis not present

## 2021-02-16 ENCOUNTER — Telehealth: Payer: Self-pay | Admitting: Hematology

## 2021-02-16 NOTE — Telephone Encounter (Signed)
Pt has been called and confirmed rescheduled appt

## 2021-02-23 ENCOUNTER — Encounter (HOSPITAL_COMMUNITY): Payer: Self-pay

## 2021-02-23 ENCOUNTER — Other Ambulatory Visit: Payer: Self-pay

## 2021-02-23 ENCOUNTER — Emergency Department (HOSPITAL_COMMUNITY)
Admission: EM | Admit: 2021-02-23 | Discharge: 2021-02-23 | Disposition: A | Discharge status: Home / Self Care | Payer: Medicare Other | Attending: Emergency Medicine | Admitting: Emergency Medicine

## 2021-02-23 ENCOUNTER — Inpatient Hospital Stay: Payer: Medicare Other | Attending: Nurse Practitioner | Admitting: Nurse Practitioner

## 2021-02-23 ENCOUNTER — Inpatient Hospital Stay: Payer: Medicare Other

## 2021-02-23 ENCOUNTER — Other Ambulatory Visit: Payer: Self-pay | Admitting: Nurse Practitioner

## 2021-02-23 ENCOUNTER — Encounter: Payer: Self-pay | Admitting: Nurse Practitioner

## 2021-02-23 VITALS — BP 144/85 | HR 108 | Temp 98.0°F | Wt 207.0 lb

## 2021-02-23 DIAGNOSIS — C19 Malignant neoplasm of rectosigmoid junction: Secondary | ICD-10-CM

## 2021-02-23 DIAGNOSIS — Z7901 Long term (current) use of anticoagulants: Secondary | ICD-10-CM | POA: Insufficient documentation

## 2021-02-23 DIAGNOSIS — I499 Cardiac arrhythmia, unspecified: Secondary | ICD-10-CM | POA: Diagnosis not present

## 2021-02-23 DIAGNOSIS — Z85038 Personal history of other malignant neoplasm of large intestine: Secondary | ICD-10-CM | POA: Diagnosis not present

## 2021-02-23 DIAGNOSIS — Z23 Encounter for immunization: Secondary | ICD-10-CM

## 2021-02-23 DIAGNOSIS — I4891 Unspecified atrial fibrillation: Secondary | ICD-10-CM | POA: Diagnosis not present

## 2021-02-23 DIAGNOSIS — R002 Palpitations: Secondary | ICD-10-CM | POA: Diagnosis present

## 2021-02-23 DIAGNOSIS — Z85048 Personal history of other malignant neoplasm of rectum, rectosigmoid junction, and anus: Secondary | ICD-10-CM | POA: Diagnosis not present

## 2021-02-23 DIAGNOSIS — C186 Malignant neoplasm of descending colon: Secondary | ICD-10-CM

## 2021-02-23 LAB — CMP (CANCER CENTER ONLY)
ALT: 22 U/L (ref 0–44)
AST: 18 U/L (ref 15–41)
Albumin: 3.9 g/dL (ref 3.5–5.0)
Alkaline Phosphatase: 151 U/L — ABNORMAL HIGH (ref 38–126)
Anion gap: 12 (ref 5–15)
BUN: 21 mg/dL (ref 8–23)
CO2: 23 mmol/L (ref 22–32)
Calcium: 9.5 mg/dL (ref 8.9–10.3)
Chloride: 106 mmol/L (ref 98–111)
Creatinine: 0.77 mg/dL (ref 0.44–1.00)
GFR, Estimated: >60 mL/min (ref >60)
Glucose, Bld: 103 mg/dL — ABNORMAL HIGH (ref 70–99)
Potassium: 4.5 mmol/L (ref 3.5–5.1)
Sodium: 141 mmol/L (ref 135–145)
Total Bilirubin: 0.6 mg/dL (ref 0.3–1.2)
Total Protein: 6.6 g/dL (ref 6.5–8.1)

## 2021-02-23 LAB — CBC WITH DIFFERENTIAL/PLATELET
Abs Immature Granulocytes: 0.02 10*3/uL (ref 0.00–0.07)
Basophils Absolute: 0.1 10*3/uL (ref 0.0–0.1)
Basophils Relative: 1 %
Eosinophils Absolute: 0.1 10*3/uL (ref 0.0–0.5)
Eosinophils Relative: 2 %
HCT: 42.5 % (ref 36.0–46.0)
Hemoglobin: 13.8 g/dL (ref 12.0–15.0)
Immature Granulocytes: 0 %
Lymphocytes Relative: 23 %
Lymphs Abs: 1.7 10*3/uL (ref 0.7–4.0)
MCH: 29.3 pg (ref 26.0–34.0)
MCHC: 32.5 g/dL (ref 30.0–36.0)
MCV: 90.2 fL (ref 80.0–100.0)
Monocytes Absolute: 0.5 10*3/uL (ref 0.1–1.0)
Monocytes Relative: 7 %
Neutro Abs: 4.9 10*3/uL (ref 1.7–7.7)
Neutrophils Relative %: 67 %
Platelets: 196 10*3/uL (ref 150–400)
RBC: 4.71 MIL/uL (ref 3.87–5.11)
RDW: 14.4 % (ref 11.5–15.5)
WBC: 7.3 10*3/uL (ref 4.0–10.5)
nRBC: 0 % (ref 0.0–0.2)

## 2021-02-23 LAB — CBC WITH DIFFERENTIAL (CANCER CENTER ONLY)
Abs Immature Granulocytes: 0.02 10*3/uL (ref 0.00–0.07)
Basophils Absolute: 0.1 10*3/uL (ref 0.0–0.1)
Basophils Relative: 1 %
Eosinophils Absolute: 0.2 10*3/uL (ref 0.0–0.5)
Eosinophils Relative: 2 %
HCT: 41.2 % (ref 36.0–46.0)
Hemoglobin: 13.3 g/dL (ref 12.0–15.0)
Immature Granulocytes: 0 %
Lymphocytes Relative: 23 %
Lymphs Abs: 1.6 10*3/uL (ref 0.7–4.0)
MCH: 28.6 pg (ref 26.0–34.0)
MCHC: 32.3 g/dL (ref 30.0–36.0)
MCV: 88.6 fL (ref 80.0–100.0)
Monocytes Absolute: 0.6 10*3/uL (ref 0.1–1.0)
Monocytes Relative: 8 %
Neutro Abs: 4.6 10*3/uL (ref 1.7–7.7)
Neutrophils Relative %: 66 %
Platelet Count: 211 10*3/uL (ref 150–400)
RBC: 4.65 MIL/uL (ref 3.87–5.11)
RDW: 14.6 % (ref 11.5–15.5)
WBC Count: 7 10*3/uL (ref 4.0–10.5)
nRBC: 0 % (ref 0.0–0.2)

## 2021-02-23 LAB — BASIC METABOLIC PANEL
Anion gap: 9 (ref 5–15)
BUN: 19 mg/dL (ref 8–23)
CO2: 24 mmol/L (ref 22–32)
Calcium: 9.1 mg/dL (ref 8.9–10.3)
Chloride: 104 mmol/L (ref 98–111)
Creatinine, Ser: 0.69 mg/dL (ref 0.44–1.00)
GFR, Estimated: >60 mL/min (ref >60)
Glucose, Bld: 101 mg/dL — ABNORMAL HIGH (ref 70–99)
Potassium: 4.2 mmol/L (ref 3.5–5.1)
Sodium: 137 mmol/L (ref 135–145)

## 2021-02-23 LAB — MAGNESIUM: Magnesium: 1.9 mg/dL (ref 1.7–2.4)

## 2021-02-23 LAB — CEA (IN HOUSE-CHCC): CEA (CHCC-In House): 1.48 ng/mL (ref 0.00–5.00)

## 2021-02-23 MED ORDER — APIXABAN 5 MG PO TABS: 5.0000 mg | ORAL_TABLET | Freq: Two times a day (BID) | ORAL | 0 refills | Status: DC

## 2021-02-23 MED ORDER — APIXABAN 5 MG PO TABS
5.0000 mg | ORAL_TABLET | Freq: Two times a day (BID) | ORAL | Status: DC
  Administered 2021-02-23: 5 mg via ORAL
  Filled 2021-02-23 (×2): qty 1

## 2021-02-23 MED ORDER — INFLUENZA VAC A&B SA ADJ QUAD 0.5 ML IM PRSY
0.5000 mL | PREFILLED_SYRINGE | Freq: Once | INTRAMUSCULAR | Status: DC
  Filled 2021-02-23: qty 0.5

## 2021-02-23 MED ORDER — APIXABAN (ELIQUIS) EDUCATION KIT FOR DVT/PE PATIENTS
PACK | Freq: Once | Status: AC
  Filled 2021-02-23: qty 1

## 2021-02-23 MED ORDER — METOPROLOL TARTRATE 5 MG/5ML IV SOLN
2.5000 mg | Freq: Once | INTRAVENOUS | Status: AC
  Administered 2021-02-23: 2.5 mg via INTRAVENOUS
  Filled 2021-02-23: qty 5

## 2021-02-23 MED ORDER — METOPROLOL SUCCINATE ER 25 MG PO TB24: 12.5000 mg | ORAL_TABLET | Freq: Every day | ORAL | 0 refills | Status: DC

## 2021-02-23 NOTE — ED Notes (Signed)
Pharmacy called, will come to do eliquis education

## 2021-02-23 NOTE — ED Notes (Signed)
Per cancer center, appointment for screening-states irregular HR-EKG shows afib with RVR-new onset

## 2021-02-23 NOTE — Discharge Instructions (Signed)

## 2021-02-23 NOTE — Progress Notes (Signed)
Patient brought to Recess B in the emergency room.  Report given to [facility].  Patient was alert and oriented X 4.  Brought over via wheelchair for new onset afib. Patient had all personal belongings on her and they remained with her.

## 2021-02-23 NOTE — ED Triage Notes (Addendum)
Pt comes from cancer center, where she was at a 5 year check up for rectal CA. Pt was found to be in a fib. Pt may have had it back when she had treatment, but was not put on any meds. Pt denies SHOB, chest pain. Pt states she had palpations when going to sleep.

## 2021-02-23 NOTE — ED Provider Notes (Signed)
[facility] DEPT Provider Note   CSN: [id number] Arrival date & time: 02/23/21  1303     History Chief Complaint  Patient presents with   Atrial Fibrillation    Bethany Frederick is a 70 y.o. female.  70 yO F with a chief complaints of A. fib.  This was noticed at the cancer center today.  She had irregular heartbeat and an EKG was obtained.  She was noted to be in A. fib with RVR.  She was then sent down here for evaluation.  She tells me that she has had a diagnosis of an irregular heartbeat for years.  Never diagnosed with A. fib as far she knows.  Denies any symptoms with this.  Denies cough congestion or fever denies nausea vomiting or diarrhea denies abdominal pain.  Denies chest pain or pressure.  The history is provided by the patient.  Atrial Fibrillation This is a new problem. The current episode started 2 days ago. The problem occurs constantly. The problem has not changed since onset.Pertinent negatives include no chest pain, no headaches and no shortness of breath. Nothing aggravates the symptoms. Nothing relieves the symptoms. She has tried nothing for the symptoms. The treatment provided no relief.      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   Seasonal allergies    Wears partial dentures    upper front - 1 tooth    Patient Active Problem List   Diagnosis Date Noted   Foot pain, bilateral 10/15/2019   Ventral hernia without obstruction or gangrene 03/09/2017   Port catheter in place 03/15/2016   Cancer of rectosigmoid (colon) ([place]) 01/28/2016   Sclerosing adenosis of left breast 05/14/2015    Past Surgical History:  Procedure Laterality Date    APPENDECTOMY     BREAST BIOPSY Left 2016   BREAST LUMPECTOMY Left 03/2015   BREAST LUMPECTOMY WITH RADIOACTIVE SEED LOCALIZATION Left 03/13/2015   Procedure: BREAST LUMPECTOMY WITH RADIOACTIVE SEED LOCALIZATION;  Surgeon: Autumn Messing III, MD;  Location: [place];  Service: General;  Laterality: Left;   COLECTOMY     COLONOSCOPY     ENDOMETRIAL FULGURATION  06/13/2000   HERNIA REPAIR     INGUINAL HERNIA REPAIR  03/05/2012   Procedure: HERNIA REPAIR INGUINAL INCARCERATED;  Surgeon: Ralene Ok, MD;  Location: [place];  Service: General;  Laterality: left;   INGUINAL HERNIA REPAIR Left    INSERTION OF MESH  03/05/2012   Procedure: INSERTION OF MESH;  Surgeon: Ralene Ok, MD;  Location: [place];  Service: General;  Laterality: Left;   INSERTION OF MESH N/A 03/09/2017   Procedure: INSERTION OF MESH;  Surgeon: Jovita Kussmaul, MD;  Location: [place];  Service: General;  Laterality: N/A;   LAPAROSCOPIC ASSISTED VENTRAL HERNIA REPAIR  03/09/2017   w/mesh   LAPAROSCOPIC SIGMOID COLECTOMY Bilateral 01/28/2016   Procedure: LAPAROSCOPIC ASSISTED SIGMOID COLECTOMY;  Surgeon: Autumn Messing III, MD;  Location: [place];  Service: General;  Laterality: Bilateral;   MYOMECTOMY  06/13/2000   PORT-A-CATH REMOVAL Right 03/09/2017   PORT-A-CATH REMOVAL N/A 03/09/2017   Procedure: REMOVAL PORT-A-CATH RIGHT CHEST;  Surgeon: Autumn Messing III,  MD;  Location: [place];  Service: General;  Laterality: N/A;   PORTACATH PLACEMENT N/A 02/18/2016   Procedure: INSERTION PORT-A-CATH;  Surgeon: Autumn Messing III, MD;  Location: [place];  Service: General;  Laterality: N/A;   SHOULDER ARTHROSCOPY W/ ROTATOR CUFF REPAIR Left 2013   VENTRAL HERNIA REPAIR N/A 03/09/2017   Procedure: LAPAROSCOPIC VENTRAL HERNIA REPAIR;  Surgeon: Jovita Kussmaul, MD;  Location: [place];  Service: General;  Laterality: N/A;     OB History   No obstetric history on file.     Family History  Problem Relation Age of Onset    Cancer Brother 59       prostate cancer    Cancer Paternal Aunt 79       breast cancer    Cancer Cousin 62       ovarian and breast cancer (maternal cousin)     Social History   Tobacco Use   Smoking status: Never   Smokeless tobacco: Never  Vaping Use   Vaping Use: Never used  Substance Use Topics   Alcohol use: Yes    Alcohol/week: 7.0 standard drinks    Types: 7 Glasses of wine per week   Drug use: No    Home Medications Prior to Admission medications   Medication Sig Start Date End Date Taking? Authorizing Provider  apixaban (ELIQUIS) 5 MG TABS tablet Take 1 tablet (5 mg total) by mouth 2 (two) times daily. 02/23/21 03/25/21 Yes Deno Etienne, DO  metoprolol succinate (TOPROL-XL) 25 MG 24 hr tablet Take 0.5 tablets (12.5 mg total) by mouth daily. 02/23/21  Yes Deno Etienne, DO  clobetasol cream (TEMOVATE) 9.23 % Apply 1 application topically 2 (two) times daily as needed (RASH).  12/31/15   [provider]  famotidine (PEPCID) 20 MG tablet Take 20 mg by mouth daily as needed for heartburn or indigestion.  11/13/15   [provider]  ibuprofen (ADVIL,MOTRIN) 200 MG tablet Take 400 mg by mouth 2 (two) times daily as needed for mild pain.    [provider]  metFORMIN (GLUCOPHAGE-XR) 500 MG 24 hr tablet Take 500 mg by mouth 2 (two) times daily. 10/05/19   [provider]  Multiple Vitamins-Minerals (PRESERVISION AREDS 2+MULTI VIT PO) Take by mouth.    [provider]  Polyethyl Glycol-Propyl Glycol (SYSTANE OP) Apply 1 drop to eye 2 (two) times daily.    [provider]  Vitamin D, Ergocalciferol, (DRISDOL) 50000 units CAPS capsule Take 1.25 Units by mouth once a week. 08/25/17   [provider]    Allergies    No known allergies  Review of Systems   Review of Systems  Constitutional:  Negative for chills and fever.  HENT:  Negative for congestion and rhinorrhea.   Eyes:  Negative for redness and visual disturbance.   Respiratory:  Negative for shortness of breath and wheezing.   Cardiovascular:  Negative for chest pain and palpitations.  Gastrointestinal:  Negative for nausea and vomiting.  Genitourinary:  Negative for dysuria and urgency.  Musculoskeletal:  Negative for arthralgias and myalgias.  Skin:  Negative for pallor and wound.  Neurological:  Negative for dizziness and headaches.   Physical Exam Updated Vital Signs BP (!) 142/88   Pulse 95   Temp 97.7 F (36.5 C)   Resp 20   Ht $R'5\' 6"'al$  (1.676 m)   Wt 93.9 kg   SpO2 99%   BMI 33.41 kg/m   Physical Exam Vitals and nursing note  reviewed.  Constitutional:      General: She is not in acute distress.    Appearance: She is well-developed. She is not diaphoretic.  HENT:     Head: Normocephalic and atraumatic.  Eyes:     Pupils: Pupils are equal, round, and reactive to light.  Cardiovascular:     Rate and Rhythm: Normal rate. Rhythm irregular.     Heart sounds: No murmur heard.   No friction rub. No gallop.  Pulmonary:     Effort: Pulmonary effort is normal.     Breath sounds: No wheezing or rales.  Abdominal:     General: There is no distension.     Palpations: Abdomen is soft.     Tenderness: There is no abdominal tenderness.  Musculoskeletal:        General: No tenderness.     Cervical back: Normal range of motion and neck supple.  Skin:    General: Skin is warm and dry.  Neurological:     Mental Status: She is alert and oriented to person, place, and time.  Psychiatric:        Behavior: Behavior normal.    ED Results / Procedures / Treatments   Labs (all labs ordered are listed, but only abnormal results are displayed) Labs Reviewed  BASIC METABOLIC PANEL - Abnormal; Notable for the following components:      Result Value   Glucose, Bld 101 (*)    All other components within normal limits  CBC WITH DIFFERENTIAL/PLATELET  MAGNESIUM    EKG None  Radiology No results found.  Procedures Procedures    Medications Ordered in ED Medications  metoprolol tartrate (LOPRESSOR) injection 2.5 mg (2.5 mg Intravenous Given 02/23/21 1341)  apixaban (ELIQUIS) Education Kit for DVT/PE patients ( Does not apply Given by Other 02/23/21 1428)    ED Course  I have reviewed the triage vital signs and the nursing notes.  Pertinent labs & imaging results that were available during my care of the patient were reviewed by me and considered in my medical decision making (see chart for details).    MDM Rules/Calculators/A&P                           70 yo F with a cc of atrial fibrillation.  This was noted incidentally while she was at the cancer center for routine blood work.  Found to be in A. fib with rates in the low 100s.  She has no symptoms with this.  Unsure of the exact timing of onset.  Her heart rate has not been over 120 since I been in the room.  We will give a low-dose of metoprolol.  Have her follow-up in the A. fib clinic.  Basic blood work.  7:17 AM:  I have discussed the diagnosis/risks/treatment options with the patient and believe the pt to be eligible for discharge home to follow-up with PCP. We also discussed returning to the ED immediately if new or worsening sx occur. We discussed the sx which are most concerning (e.g., sudden worsening pain, fever, inability to tolerate by mouth) that necessitate immediate return. Medications administered to the patient during their visit and any new prescriptions provided to the patient are listed below.  Medications given during this visit Medications  metoprolol tartrate (LOPRESSOR) injection 2.5 mg (2.5 mg Intravenous Given 02/23/21 1341)  apixaban (ELIQUIS) Education Kit for DVT/PE patients ( Does not apply Given by Other 02/23/21 1428)  The patient appears reasonably screen and/or stabilized for discharge and I doubt any other medical condition or other [facility] requiring further screening, evaluation, or treatment in the ED at this time prior  to discharge.   Final Clinical Impression(s) / ED Diagnoses Final diagnoses:  Atrial fibrillation with RVR ([place])    Rx / DC Orders ED Discharge Orders          Ordered    metoprolol succinate (TOPROL-XL) 25 MG 24 hr tablet  Daily        02/23/21 1454    apixaban (ELIQUIS) 5 MG TABS tablet  2 times daily        02/23/21 [place], [place], DO 02/24/21 [phone]

## 2021-02-23 NOTE — Progress Notes (Addendum)
[place]   Telephone:([phone] Fax:(336) [phone]   Clinic Follow up Note   Patient Care Team: Bethany Rasmussen, MD as PCP - General (Family Medicine) Bethany Kussmaul, MD as Consulting Physician (General Surgery) 02/23/2021  CHIEF COMPLAINT: Follow-up stage III colon cancer  SUMMARY OF ONCOLOGIC HISTORY: Oncology History Overview Note  Rectal cancer [facility])   Staging form: Colon and Rectum, AJCC 7th Edition   - Pathologic stage from 01/28/2016: Stage IIIC (T3, N2b, cM0) - Signed by Truitt Merle, MD on 02/12/2016    Cancer of rectosigmoid (colon) ([place])  01/03/2016 Initial Biopsy   Diagnosis Colon, biopsy, rectosigmoid junction - ADENOCARCINOMA, SEE COMMENT.   01/04/2016 Procedure   Colonoscopy by Dr. Estanislado Emms showed a partially obstructing tumor in the rectal sigmoid colon, biopsied.   01/05/2016 Imaging   CT abdomen and pelvis with contrast showed no dominant sigmoid colon mass, soft tissue fullness at the rectosigmoid junction, suspicious for the primary lesion. Borderline enlarged nodes in the sigmoid mesocolon and at the origin of the inferior mesenteric artery are indeterminate no evidence of December metastasis. Left hepatic lobe giant hemangioma.    01/28/2016 Initial Diagnosis   Rectal cancer ([place])   01/28/2016 Surgery   Endoscopic low anterior resection of rectosigmoid segmental colon for tumor    01/28/2016 Pathology Results   Invasive colonic adenocarcinoma, 4.5 cm extending into her chronic connective tissue, lymphovascular involvement by tumor, margins were negative, metastatic carcinoma in 9 of 21 lymph nodes.   02/24/2016 Imaging   CT chest w/o contrast IMPRESSION: 1. No evidence of pulmonary metastasis. 2. Stable large hepatic hemangioma.   03/08/2016 - 08/10/2016 Adjuvant Chemotherapy   Adjuvant chemotherapy with CAPOX (Xeloda 2000 mg twice daily, on day 1-14, oxaliplatin 182m/m2 on day 1) every 3 weeks,  Oxaliplatin held after 4 cycles and she had  additional 4 cycles of Xeloda.    09/14/2016 Imaging   CT CAP W Contrast IMPRESSION: No evidence of locally recurrent or metastatic carcinoma. Small periumbilical hernia containing small bowel. Stable 5 cm benign hemangioma in left hepatic lobe. Stable small calcified uterine fibroid. Colonic diverticulosis. No radiographic evidence of diverticulitis.   09/14/2016 Imaging   CT CAP w contrast  IMPRESSION: No evidence of locally recurrent or metastatic carcinoma. Small periumbilical hernia containing small bowel. Stable 5 cm benign hemangioma in left hepatic lobe. Stable small calcified uterine fibroid. Colonic diverticulosis. No radiographic evidence of diverticulitis.   04/10/2017 Imaging   CT CAP W Contrast 04/10/17 IMPRESSION: 1. No findings of recurrent malignancy. The hepatic lesions have benign imaging characteristics. 2. Interval ventral hernia repair without complicating feature. 3. Other imaging findings of potential clinical significance: Aortic Atherosclerosis (ICD10-I70.0). Mild cardiomegaly. Stable scarring in both lungs. Sigmoid diverticulosis. Prominent stool throughout the colon favors constipation. Calcified anterior subserosal fibroid along the uterus.   12/27/2017 Imaging   CT CAP W Contrast 12/27/17 IMPRESSION: 1. Recurrent ventral hernia along the inferior aspect of the midline incision containing only fat. 2. Stable negative chest CT.  No acute abdominopelvic findings. 3. No evidence of metastatic disease post partial colectomy. 4. Stable hepatic hemangioma and cysts. Coronary and Aortic Atherosclerosis (ICD10-I70.0).   12/24/2018 Imaging    CT CAP W Contrast 12/24/18  IMPRESSION: 1. Stable exam. No findings of metastatic disease status post partial colectomy. 2. Unchanged liver hemangioma and liver cysts. 3.  Aortic Atherosclerosis (ICD10-I70.0).       CURRENT THERAPY: Surveillance  INTERVAL HISTORY: [name] for follow-up as scheduled.   Last seen  by Bethany Frederick 02/26/2020.  She is doing well in general with no specific concerns.  He got COVID in July, symptoms were chest congestion, sore throat, and tooth pain, it took her a long time to recover but she eventually did.  Energy fluctuates she attributes to her age.  She is eating a low-carb diet and exercising with some intentional weight loss.  Bowels are normal for her with periodic diarrhea from metformin.  Denies abdominal pain or bloating.  Denies GI bleeding.  She has mild exertional dyspnea, no cough, chest pain.  Legs swell throughout the day.  She has some dizziness she attributes to vertigo.  At night when she lies down she can feel her heart racing.  He was told she has abnormal heart rhythm around the time of her colon cancer diagnosis but has not been worked up.   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   Seasonal allergies    Wears partial dentures    upper front - 1 tooth    SURGICAL HISTORY: Past Surgical History:  Procedure Laterality Date   APPENDECTOMY     BREAST BIOPSY Left 2016   BREAST LUMPECTOMY Left 03/2015   BREAST LUMPECTOMY WITH RADIOACTIVE SEED LOCALIZATION Left 03/13/2015   Procedure: BREAST LUMPECTOMY WITH RADIOACTIVE SEED LOCALIZATION;  Surgeon: Autumn Messing III, MD;  Location: [place];  Service: General;  Laterality: Left;   COLECTOMY     COLONOSCOPY     ENDOMETRIAL FULGURATION  06/13/2000   HERNIA REPAIR     INGUINAL HERNIA REPAIR  03/05/2012   Procedure: HERNIA REPAIR INGUINAL INCARCERATED;  Surgeon: Ralene Ok, MD;  Location: [place];  Service: General;  Laterality: left;   INGUINAL HERNIA REPAIR Left    INSERTION OF  MESH  03/05/2012   Procedure: INSERTION OF MESH;  Surgeon: Ralene Ok, MD;  Location: [place];  Service: General;  Laterality: Left;   INSERTION OF MESH N/A 03/09/2017   Procedure: INSERTION OF MESH;  Surgeon: Bethany Kussmaul, MD;  Location: [place];  Service: General;  Laterality: N/A;   LAPAROSCOPIC ASSISTED VENTRAL HERNIA REPAIR  03/09/2017   w/mesh   LAPAROSCOPIC SIGMOID COLECTOMY Bilateral 01/28/2016   Procedure: LAPAROSCOPIC ASSISTED SIGMOID COLECTOMY;  Surgeon: Autumn Messing III, MD;  Location: [place];  Service: General;  Laterality: Bilateral;   MYOMECTOMY  06/13/2000   PORT-A-CATH REMOVAL Right 03/09/2017   PORT-A-CATH REMOVAL N/A 03/09/2017   Procedure: REMOVAL PORT-A-CATH RIGHT CHEST;  Surgeon: Bethany Kussmaul, MD;  Location: [place];  Service: General;  Laterality: N/A;   PORTACATH PLACEMENT N/A 02/18/2016   Procedure: INSERTION PORT-A-CATH;  Surgeon: Autumn Messing III, MD;  Location: [place];  Service: General;  Laterality: N/A;   SHOULDER ARTHROSCOPY W/ ROTATOR CUFF REPAIR Left 2013   VENTRAL HERNIA REPAIR N/A 03/09/2017   Procedure: LAPAROSCOPIC VENTRAL HERNIA REPAIR;  Surgeon: Bethany Kussmaul, MD;  Location: [place];  Service: General;  Laterality: N/A;    I have reviewed the social history and family history with the patient and they are unchanged from previous note.  ALLERGIES:  is allergic to no known allergies.  MEDICATIONS:  Current Outpatient Medications  Medication Sig Dispense Refill   clobetasol cream (TEMOVATE) 3.08 % Apply 1 application topically 2 (two) times daily as needed (RASH).      famotidine (PEPCID) 20 MG tablet Take 20 mg by mouth daily as needed for heartburn or indigestion.   0   ibuprofen (ADVIL,MOTRIN) 200 MG tablet Take 400 mg by mouth 2 (two) times daily as needed for mild pain.     metFORMIN (GLUCOPHAGE-XR) 500 MG 24 hr tablet Take 500 mg by mouth 2 (two) times daily.     Multiple Vitamins-Minerals (PRESERVISION AREDS 2+MULTI VIT PO) Take by  mouth.     Polyethyl Glycol-Propyl Glycol (SYSTANE OP) Apply 1 drop to eye 2 (two) times daily.     Vitamin D, Ergocalciferol, (DRISDOL) 50000 units CAPS capsule Take 1.25 Units by mouth once a week.     No current facility-administered medications for this visit.   Facility-Administered Medications Ordered in Other Visits  Medication Dose Route Frequency Provider Last Rate Last Admin   influenza vaccine adjuvanted (FLUAD) injection 0.5 mL  0.5 mL Intramuscular Once Alla Feeling, NP       sodium chloride flush (NS) 0.9 % injection 10 mL  10 mL Intravenous PRN Truitt Merle, MD   10 mL at 05/31/16 1303    PHYSICAL EXAMINATION: ECOG PERFORMANCE STATUS: 0 - Asymptomatic  Vitals:   02/23/21 1153  BP: (!) 144/85  Pulse: (!) 108  Temp: 98 F (36.7 C)  SpO2: 100%   Filed Weights   02/23/21 1153  Weight: 207 lb (93.9 kg)    GENERAL:alert, no distress and comfortable SKIN: No rash  EYES: sclera clear NECK: Without mass LYMPH:  no palpable cervical or supraclavicular lymphadenopathy  LUNGS: clear with normal breathing effort HEART: Irregular rate and rhythm, mild bilateral lower extremity edema ABDOMEN:abdomen soft, non-tender and normal bowel sounds NEURO: alert & oriented x 3 with fluent speech, no focal motor/sensory deficits  LABORATORY DATA:  I have reviewed the data as listed CBC Latest Ref Rng & Units 02/23/2021 02/26/2020 06/28/2019  WBC 4.0 - 10.5 K/uL 7.0 6.9 7.2  Hemoglobin 12.0 - 15.0 g/dL 13.3 13.7 15.0  Hematocrit 36.0 - 46.0 % 41.2 42.5 46.2(H)  Platelets 150 - 400 K/uL 211 193 192     CMP Latest Ref Rng & Units 02/23/2021 02/26/2020 06/28/2019  Glucose 70 - 99 mg/dL 103(H) 102(H) 107(H)  BUN 8 - 23 mg/dL _0 Creatinine 0.44 - 1.00 mg/dL 0.77 0.71 0.74  Sodium 135 - 145 mmol/L 141 140 141  Potassium 3.5 - 5.1 mmol/L 4.5 4.5 4.9  [place] 98 - 111 mmol/L 106 107 107  CO2 22 - 32 mmol/L _1 Calcium 8.9 - 10.3 mg/dL 9.5 9.4 9.5  Total Protein 6.5 -  8.1 g/dL 6.6 6.1(L) 7.0  Total Bilirubin 0.3 - 1.2 mg/dL 0.6 0.4 0.4  Alkaline Phos 38 - 126 U/L 151(H) 133(H) 137(H)  AST 15 - 41 U/L _2 ALT 0 - 44 U/L _3 RADIOGRAPHIC STUDIES: I have personally reviewed the radiological images as listed and agreed with the findings in the report. No results found.   ASSESSMENT & PLAN: Bethany Frederick is a 70 y.o. female with      1. Colorectal cancer, invasive adenocarcinoma,pT3N2bM0, stage IIIC, MSI-stable  -Diagnosed in 12/2015. S/p surgical resection and adjuvant CAPOX.  -Her surveillance scans through 12/2018 have been NED -She had a repeat  colonoscopy with Dr. Watt Climes since her surgery and again 02/2020, per patient she will repeat every 3-years -Bethany Frederick is clinically doing well, exam is benign, CBC and CEA are normal, CMP with stable elevated alk phos in the setting of known liver hemangioma and benign cysts.   -Overall there is no clinical concern for colon cancer recurrence.  She is now 5 years from her initial cancer diagnosis, she can continue surveillance with PCP.   -We will see her back in the future if needed.  2.  Cardiac arrhythmia -She was told she had an abnormal heart rhythm in 2017 when she was diagnosed with colon cancer. -PCP has been monitoring with periodic echo and ?  EKG although not available to me in epic -Today's exam reveals an irregular rate and rhythm with bilateral lower extremity edema -She is symptomatic with fatigue, exertional dyspnea, and palpitations at night -I placed an urgent referral to cardiology for further evaluation and management - Would be an acceptable candidate for anticoagulation.  She had stage 3 colorectal cancer 5 years ago with no clinical evidence of recurrence.  No evidence of recent GI bleed, and no anemia. -Today's EKG shows Afib with RVR, I recommend urgent eval in the ED, patient agrees. I spoke to charge nurse and she was transported in stable condition. I also informed  patient's son Matt   3. 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 -Continue annual screening mammogram, self exam and routine follow-up.   4.  Health maintenance and disease prevention  -I encouraged her to continue healthy active lifestyle and age-appropriate health maintenance -She had COVID-vaccine series and 1 booster, she had COVID infection in 11/2020, plans to get another booster later this year  -I recommend flu vaccine after ED work up and stabilization  5. Vertigo  -Intermittent, She can use OTC Meclizine if needed. I suggested she follow up with ENT.    PLAN: -Transport to ED for Afib RVR -Labs reviewed, no clinical concern for colon cancer recurrence -Completed 5 year colon cancer surveillance and can be discharged from oncology, f/up if needed in the future  -continue f/up with PCP, healthy lifestyle, and age-appropriate cancer screenings  Orders Placed This Encounter  Procedures   Ambulatory referral to Cardiology    Referral Priority:   Urgent    Referral Type:   Consultation    Referral Reason:   Specialty Services Required    Requested Specialty:   Cardiology    Number of Visits Requested:   1   EKG 12-Lead    Abnormal rhythm on exam   All questions were answered. The patient knows to call the clinic with any problems, questions or concerns. No barriers to learning were detected.  Encounter time is 35 minutes.    Alla Feeling, NP 02/23/21

## 2021-02-24 ENCOUNTER — Ambulatory Visit: Payer: Federal, State, Local not specified - PPO | Admitting: Nurse Practitioner

## 2021-02-24 ENCOUNTER — Other Ambulatory Visit: Payer: Federal, State, Local not specified - PPO

## 2021-02-24 ENCOUNTER — Telehealth (HOSPITAL_COMMUNITY): Payer: Self-pay | Admitting: Physician Assistant

## 2021-02-24 NOTE — Telephone Encounter (Signed)
Patient returned my call and is agreeable to appt 03/02/21; pt provided directions to [facility].

## 2021-02-24 NOTE — Telephone Encounter (Signed)
Called and left message for patient to call back.  Needs follow up in a week from being seen in ED 02/23/21.

## 2021-03-02 ENCOUNTER — Other Ambulatory Visit: Payer: Self-pay

## 2021-03-02 ENCOUNTER — Encounter (HOSPITAL_COMMUNITY): Payer: Self-pay | Admitting: Physician Assistant

## 2021-03-02 ENCOUNTER — Ambulatory Visit (HOSPITAL_COMMUNITY)
Admission: RE | Admit: 2021-03-02 | Discharge: 2021-03-02 | Disposition: A | Discharge status: Home / Self Care | Payer: Medicare Other | Source: Ambulatory Visit | Attending: Physician Assistant | Admitting: Physician Assistant

## 2021-03-02 VITALS — BP 132/98 | HR 112 | Ht 66.0 in | Wt 202.0 lb

## 2021-03-02 DIAGNOSIS — I4819 Other persistent atrial fibrillation: Secondary | ICD-10-CM | POA: Insufficient documentation

## 2021-03-02 DIAGNOSIS — Z6832 Body mass index (BMI) 32.0-32.9, adult: Secondary | ICD-10-CM | POA: Diagnosis not present

## 2021-03-02 DIAGNOSIS — Z79899 Other long term (current) drug therapy: Secondary | ICD-10-CM | POA: Insufficient documentation

## 2021-03-02 DIAGNOSIS — Z7901 Long term (current) use of anticoagulants: Secondary | ICD-10-CM | POA: Diagnosis not present

## 2021-03-02 DIAGNOSIS — E669 Obesity, unspecified: Secondary | ICD-10-CM | POA: Insufficient documentation

## 2021-03-02 DIAGNOSIS — Z7984 Long term (current) use of oral hypoglycemic drugs: Secondary | ICD-10-CM | POA: Insufficient documentation

## 2021-03-02 DIAGNOSIS — Z85038 Personal history of other malignant neoplasm of large intestine: Secondary | ICD-10-CM | POA: Diagnosis not present

## 2021-03-02 MED ORDER — METOPROLOL SUCCINATE ER 25 MG PO TB24: 25.0000 mg | ORAL_TABLET | Freq: Every day | ORAL | 3 refills | Status: DC

## 2021-03-02 MED ORDER — APIXABAN 5 MG PO TABS: 5.0000 mg | ORAL_TABLET | Freq: Two times a day (BID) | ORAL | 3 refills | Status: DC

## 2021-03-02 NOTE — Progress Notes (Signed)
Primary Care Physician: [name], MD Primary Cardiologist: Dr Harriet Masson (new) Primary Electrophysiologist: none Referring Physician: [facility]   Bethany Frederick is a 70 y.o. female with a history of colorectal cancer s/p resection and atrial fibrillation who presents for consultation in the [facility].  The patient was initially told she had an "abnormal heart rhythm" at the time of her colon cancer diagnosis in 2017. Never formally diagnosed with afib until her visit with her GI specialist on 02/23/21. Her physical exam revealed an irregular rhythm and ECG showed afib with rapid rates. Patient was asymptomatic. She was sent to the ED for evaluation. Patient was started on Eliquis for a CHADS2VASC score of 2 and metoprolol for rate control. She has several family members with afib. She denies snoring or significant alcohol use.   Today, she denies symptoms of palpitations, chest pain, shortness of breath, orthopnea, PND, lower extremity edema, dizziness, presyncope, syncope, snoring, daytime somnolence, bleeding, or neurologic sequela. The patient is tolerating medications without difficulties and is otherwise without complaint today.    Atrial Fibrillation Risk Factors:  she does not have symptoms or diagnosis of sleep apnea. she does not have a history of rheumatic fever. she does not have a history of alcohol use. The patient does have a history of early familial atrial fibrillation or other arrhythmias. Father and several brothers have afib.  she has a BMI of Body mass index is 32.6 kg/m.Marland Kitchen Filed Weights   03/02/21 1357  Weight: 91.6 kg    Family History  Problem Relation Age of Onset   Cancer Brother 63       prostate cancer    Cancer Paternal Aunt 58       breast cancer    Cancer Cousin 24       ovarian and breast cancer (maternal cousin)      Atrial Fibrillation Management history:  Previous antiarrhythmic drugs: none Previous  cardioversions: none Previous ablations: none CHADS2VASC score: 2 Anticoagulation history: Eliquis   Past Medical History:  Diagnosis Date   Arthritis    "mainly in my back, feet, hips" (03/09/2017)   Cancer of sigmoid colon ([place]) 2017   Cataract, immature    GERD (gastroesophageal reflux disease)    Heart palpitations    History of blood transfusion 2002   "related to endometrosis"   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;  Service: General;  Laterality: Left;   COLECTOMY     COLONOSCOPY     ENDOMETRIAL FULGURATION  06/13/2000   HERNIA REPAIR     INGUINAL HERNIA REPAIR  03/05/2012   Procedure: HERNIA REPAIR INGUINAL INCARCERATED;  Surgeon: Ralene Ok, MD;  Location: [place];  Service: General;  Laterality: left;   INGUINAL HERNIA REPAIR Left    INSERTION OF MESH  03/05/2012   Procedure: INSERTION OF MESH;  Surgeon: Ralene Ok, MD;  Location: [place];  Service: General;  Laterality: Left;   INSERTION OF MESH N/A 03/09/2017   Procedure: INSERTION OF MESH;  Surgeon: Jovita Kussmaul, MD;  Location: [place];  Service: General;  Laterality: N/A;   LAPAROSCOPIC ASSISTED VENTRAL HERNIA REPAIR  03/09/2017   w/mesh   LAPAROSCOPIC SIGMOID COLECTOMY Bilateral 01/28/2016   Procedure: LAPAROSCOPIC ASSISTED SIGMOID COLECTOMY;  Surgeon: Autumn Messing III, MD;  Location: [place];  Service: General;  Laterality: Bilateral;   MYOMECTOMY  06/13/2000   PORT-A-CATH REMOVAL Right 03/09/2017   PORT-A-CATH REMOVAL N/A 03/09/2017   Procedure: REMOVAL PORT-A-CATH RIGHT CHEST;  Surgeon: Jovita Kussmaul, MD;   Location: [place];  Service: General;  Laterality: N/A;   PORTACATH PLACEMENT N/A 02/18/2016   Procedure: INSERTION PORT-A-CATH;  Surgeon: Autumn Messing III, MD;  Location: [place];  Service: General;  Laterality: N/A;   SHOULDER ARTHROSCOPY W/ ROTATOR CUFF REPAIR Left 2013   VENTRAL HERNIA REPAIR N/A 03/09/2017   Procedure: LAPAROSCOPIC VENTRAL HERNIA REPAIR;  Surgeon: Autumn Messing III, MD;  Location: [place];  Service: General;  Laterality: N/A;    Current Outpatient Medications  Medication Sig Dispense Refill   apixaban (ELIQUIS) 5 MG TABS tablet Take 1 tablet (5 mg total) by mouth 2 (two) times daily. 60 tablet 0   clobetasol cream (TEMOVATE) 0.16 % Apply 1 application topically 2 (two) times daily as needed (RASH).      diclofenac Sodium (VOLTAREN) 1 % GEL Apply 2 g topically 4 (four) times daily.     famotidine (PEPCID) 20 MG tablet Take 20 mg by mouth daily as needed for heartburn or indigestion.   0   fluocinonide (LIDEX) 0.05 % external solution SMARTSIG:1 Application Topical Every Night PRN     ibuprofen (ADVIL,MOTRIN) 200 MG tablet Take 400 mg by mouth 2 (two) times daily as needed for mild pain.     metFORMIN (GLUCOPHAGE-XR) 500 MG 24 hr tablet Take 500 mg by mouth 2 (two) times daily.     metoprolol succinate (TOPROL-XL) 25 MG 24 hr tablet Take 0.5 tablets (12.5 mg total) by mouth daily. 30 tablet 0   Multiple Vitamins-Minerals (PRESERVISION AREDS 2+MULTI VIT PO) Take by mouth.     NEOMYCIN-POLYMYXIN-HYDROCORTISONE (CORTISPORIN) 1 % SOLN OTIC solution neomycin-polymyxin-hydrocort 3.5 mg/mL-10,000 unit/mL-1 % ear solution  INSTILL 4 DROPS INTO AFFECTED EAR 3 TIMES A DAY     Polyethyl Glycol-Propyl Glycol (SYSTANE OP) Apply 1 drop to eye 2 (two) times daily.     triamcinolone cream (KENALOG) 0.1 % PLEASE SEE ATTACHED FOR DETAILED DIRECTIONS     Vitamin D, Ergocalciferol, (DRISDOL) 50000 units CAPS capsule Take 1.25 Units by mouth once a week.     No current  facility-administered medications for this encounter.   Facility-Administered Medications Ordered in Other Encounters  Medication Dose Route Frequency Provider Last Rate Last Admin   sodium chloride flush (NS) 0.9 % injection 10 mL  10 mL Intravenous PRN Truitt Merle, MD   10 mL at 05/31/16 1303    No Known Allergies   Social History   Socioeconomic History   Marital status: Married    Spouse name: Not on file   Number of children: Not on file   Years of education: Not on file   Highest education level: Not on file  Occupational History   Not on file  Tobacco Use   Smoking status: Never   Smokeless tobacco: Never  Vaping Use   Vaping Use: Never used  Substance and Sexual Activity   Alcohol use: Not Currently    Alcohol/week: 7.0 standard drinks    Types: 7 Glasses of wine per week   Drug use:  No   Sexual activity: Not Currently  Other Topics Concern   Not on file  Social History Narrative   Not on file   Social Determinants of Health   Financial Resource Strain: Not on file  Food Insecurity: Not on file  Transportation Needs: Not on file  Physical Activity: Not on file  Stress: Not on file  Social Connections: Not on file  Intimate Partner Violence: Not on file     ROS- All systems are reviewed and negative except as per the HPI above.  Physical Exam: Vitals:   03/02/21 1357  BP: (!) 132/98  Pulse: (!) 112  Weight: 91.6 kg  Height: 5\' 6"  (1.676 m)    GEN- The patient is a well appearing obese female, alert and oriented x 3 today.   Head- normocephalic, atraumatic Eyes-  Sclera clear, conjunctiva pink Ears- hearing intact Oropharynx- clear Neck- supple  Lungs- Clear to ausculation bilaterally, normal work of breathing Heart- irregular rate and rhythm, no murmurs, rubs or gallops  GI- soft, NT, ND, + BS Extremities- no clubbing, cyanosis, or edema MS- no significant deformity or atrophy Skin- no rash or lesion Psych- euthymic mood, full affect Neuro-  strength and sensation are intact  Wt Readings from Last 3 Encounters:  03/02/21 91.6 kg  02/23/21 93.9 kg  02/23/21 93.9 kg    EKG today demonstrates  Afib Vent. rate 112 BPM PR interval * ms QRS duration 80 ms QT/QTcB 320/436 ms  Echo 06/09/17 demonstrated  - Left ventricle: The cavity size was normal. Systolic function was    normal. The estimated ejection fraction was in the range of 60%    to 65%. Wall motion was normal; there were no regional wall    motion abnormalities. Doppler parameters are consistent with    abnormal left ventricular relaxation (grade 1 diastolic    dysfunction).  - Mitral valve: Calcified annulus. Mildly thickened leaflets .  - Left atrium: The atrium was mildly dilated. Volume/bsa, S: 36.3    ml/m^2.  - Tricuspid valve: There was mild regurgitation.  - Pulmonary arteries: Systolic pressure was mildly increased. PA    peak pressure: 38 mm Hg (S).   Epic records are reviewed at length today  CHA2DS2-VASc Score = 2  The patient's score is based upon: CHF History: 0 HTN History: 0 Diabetes History: 0 Stroke History: 0 Vascular Disease History: 0 Age Score: 1 Gender Score: 1      ASSESSMENT AND PLAN: 1. Persistent Atrial Fibrillation (ICD10:  I48.19) The patient's CHA2DS2-VASc score is 2, indicating a 2.2% annual risk of stroke.   General education about afib provided and questions answered. We also discussed her stroke risk and the risks and benefits of anticoagulation. Increase Toprol 25 mg BID for better rate control.  Continue Eliquis 5 mg BID.  Will plan for DCCV after 3 weeks of uninterrupted anticoagulation.   2. Obesity Body mass index is 32.6 kg/m. Lifestyle modification was discussed at length including regular exercise and weight reduction.    Follow up with Dr Harriet Masson as scheduled post DCCV.    [facility] [address] [place],  [address] [phone] 03/02/2021 2:05  PM

## 2021-03-02 NOTE — Patient Instructions (Signed)
Cardioversion scheduled for Thursday, November 10th  -Come to afib clinic at for 9AM for labs  - Arrive at the [facility] and go to admitting at 930AM  - Do not eat or drink anything after midnight the night prior to your procedure.  - Take all your morning medication (except diabetic medications) with a sip of water prior to arrival.  - You will not be able to drive home after your procedure.  - Do NOT miss any doses of your blood thinner - if you should miss a dose please notify our office immediately.  - If you feel as if you go back into normal rhythm prior to scheduled cardioversion, please notify our office immediately. If your procedure is canceled in the cardioversion suite you will be charged a cancellation fee. Patients will be asked to: to mask in public and hand hygiene (no longer quarantine) in the 3 days prior to surgery, to report if any COVID-19-like illness or household contacts to COVID-19 to determine need for testing

## 2021-03-02 NOTE — H&P (View-Only) (Signed)
Primary Care Physician: [name], MD Primary Cardiologist: Dr Harriet Masson (new) Primary Electrophysiologist: none Referring Physician: [facility]   Bethany Frederick is a 70 y.o. female with a history of colorectal cancer s/p resection and atrial fibrillation who presents for consultation in the [facility].  The patient was initially told she had an "abnormal heart rhythm" at the time of her colon cancer diagnosis in 2017. Never formally diagnosed with afib until her visit with her GI specialist on 02/23/21. Her physical exam revealed an irregular rhythm and ECG showed afib with rapid rates. Patient was asymptomatic. She was sent to the ED for evaluation. Patient was started on Eliquis for a CHADS2VASC score of 2 and metoprolol for rate control. She has several family members with afib. She denies snoring or significant alcohol use.   Today, she denies symptoms of palpitations, chest pain, shortness of breath, orthopnea, PND, lower extremity edema, dizziness, presyncope, syncope, snoring, daytime somnolence, bleeding, or neurologic sequela. The patient is tolerating medications without difficulties and is otherwise without complaint today.    Atrial Fibrillation Risk Factors:  she does not have symptoms or diagnosis of sleep apnea. she does not have a history of rheumatic fever. she does not have a history of alcohol use. The patient does have a history of early familial atrial fibrillation or other arrhythmias. Father and several brothers have afib.  she has a BMI of Body mass index is 32.6 kg/m.Marland Kitchen Filed Weights   03/02/21 1357  Weight: 91.6 kg    Family History  Problem Relation Age of Onset   Cancer Brother 98       prostate cancer    Cancer Paternal Aunt 39       breast cancer    Cancer Cousin 66       ovarian and breast cancer (maternal cousin)      Atrial Fibrillation Management history:  Previous antiarrhythmic drugs: none Previous  cardioversions: none Previous ablations: none CHADS2VASC score: 2 Anticoagulation history: Eliquis   Past Medical History:  Diagnosis Date   Arthritis    "mainly in my back, feet, hips" (03/09/2017)   Cancer of sigmoid colon ([place]) 2017   Cataract, immature    GERD (gastroesophageal reflux disease)    Heart palpitations    History of blood transfusion 2002   "related to endometrosis"   Sclerosing adenosis of left breast 02/2015   Seasonal allergies    Wears partial dentures    upper front - 1 tooth   Past Surgical History:  Procedure Laterality Date   APPENDECTOMY     BREAST BIOPSY Left 2016   BREAST LUMPECTOMY Left 03/2015   BREAST LUMPECTOMY WITH RADIOACTIVE SEED LOCALIZATION Left 03/13/2015   Procedure: BREAST LUMPECTOMY WITH RADIOACTIVE SEED LOCALIZATION;  Surgeon: Autumn Messing III, MD;  Location: [place];  Service: General;  Laterality: Left;   COLECTOMY     COLONOSCOPY     ENDOMETRIAL FULGURATION  06/13/2000   HERNIA REPAIR     INGUINAL HERNIA REPAIR  03/05/2012   Procedure: HERNIA REPAIR INGUINAL INCARCERATED;  Surgeon: Ralene Ok, MD;  Location: [place];  Service: General;  Laterality: left;   INGUINAL HERNIA REPAIR Left    INSERTION OF MESH  03/05/2012   Procedure: INSERTION OF MESH;  Surgeon: Ralene Ok, MD;  Location: [place];  Service: General;  Laterality: Left;   INSERTION OF MESH N/A 03/09/2017   Procedure: INSERTION OF MESH;  Surgeon: Jovita Kussmaul, MD;  Location: [place];  Service: General;  Laterality: N/A;   LAPAROSCOPIC ASSISTED VENTRAL HERNIA REPAIR  03/09/2017   w/mesh   LAPAROSCOPIC SIGMOID COLECTOMY Bilateral 01/28/2016   Procedure: LAPAROSCOPIC ASSISTED SIGMOID COLECTOMY;  Surgeon: Autumn Messing III, MD;  Location: [place];  Service: General;  Laterality: Bilateral;   MYOMECTOMY  06/13/2000   PORT-A-CATH REMOVAL Right 03/09/2017   PORT-A-CATH REMOVAL N/A 03/09/2017   Procedure: REMOVAL PORT-A-CATH RIGHT CHEST;  Surgeon: Jovita Kussmaul, MD;   Location: [place];  Service: General;  Laterality: N/A;   PORTACATH PLACEMENT N/A 02/18/2016   Procedure: INSERTION PORT-A-CATH;  Surgeon: Autumn Messing III, MD;  Location: [place];  Service: General;  Laterality: N/A;   SHOULDER ARTHROSCOPY W/ ROTATOR CUFF REPAIR Left 2013   VENTRAL HERNIA REPAIR N/A 03/09/2017   Procedure: LAPAROSCOPIC VENTRAL HERNIA REPAIR;  Surgeon: Autumn Messing III, MD;  Location: [place];  Service: General;  Laterality: N/A;    Current Outpatient Medications  Medication Sig Dispense Refill   apixaban (ELIQUIS) 5 MG TABS tablet Take 1 tablet (5 mg total) by mouth 2 (two) times daily. 60 tablet 0   clobetasol cream (TEMOVATE) 4.09 % Apply 1 application topically 2 (two) times daily as needed (RASH).      diclofenac Sodium (VOLTAREN) 1 % GEL Apply 2 g topically 4 (four) times daily.     famotidine (PEPCID) 20 MG tablet Take 20 mg by mouth daily as needed for heartburn or indigestion.   0   fluocinonide (LIDEX) 0.05 % external solution SMARTSIG:1 Application Topical Every Night PRN     ibuprofen (ADVIL,MOTRIN) 200 MG tablet Take 400 mg by mouth 2 (two) times daily as needed for mild pain.     metFORMIN (GLUCOPHAGE-XR) 500 MG 24 hr tablet Take 500 mg by mouth 2 (two) times daily.     metoprolol succinate (TOPROL-XL) 25 MG 24 hr tablet Take 0.5 tablets (12.5 mg total) by mouth daily. 30 tablet 0   Multiple Vitamins-Minerals (PRESERVISION AREDS 2+MULTI VIT PO) Take by mouth.     NEOMYCIN-POLYMYXIN-HYDROCORTISONE (CORTISPORIN) 1 % SOLN OTIC solution neomycin-polymyxin-hydrocort 3.5 mg/mL-10,000 unit/mL-1 % ear solution  INSTILL 4 DROPS INTO AFFECTED EAR 3 TIMES A DAY     Polyethyl Glycol-Propyl Glycol (SYSTANE OP) Apply 1 drop to eye 2 (two) times daily.     triamcinolone cream (KENALOG) 0.1 % PLEASE SEE ATTACHED FOR DETAILED DIRECTIONS     Vitamin D, Ergocalciferol, (DRISDOL) 50000 units CAPS capsule Take 1.25 Units by mouth once a week.     No current  facility-administered medications for this encounter.   Facility-Administered Medications Ordered in Other Encounters  Medication Dose Route Frequency Provider Last Rate Last Admin   sodium chloride flush (NS) 0.9 % injection 10 mL  10 mL Intravenous PRN Truitt Merle, MD   10 mL at 05/31/16 1303    No Known Allergies   Social History   Socioeconomic History   Marital status: Married    Spouse name: Not on file   Number of children: Not on file   Years of education: Not on file   Highest education level: Not on file  Occupational History   Not on file  Tobacco Use   Smoking status: Never   Smokeless tobacco: Never  Vaping Use   Vaping Use: Never used  Substance and Sexual Activity   Alcohol use: Not Currently    Alcohol/week: 7.0 standard drinks    Types: 7 Glasses of wine per week   Drug use:  No   Sexual activity: Not Currently  Other Topics Concern   Not on file  Social History Narrative   Not on file   Social Determinants of Health   Financial Resource Strain: Not on file  Food Insecurity: Not on file  Transportation Needs: Not on file  Physical Activity: Not on file  Stress: Not on file  Social Connections: Not on file  Intimate Partner Violence: Not on file     ROS- All systems are reviewed and negative except as per the HPI above.  Physical Exam: Vitals:   03/02/21 1357  BP: (!) 132/98  Pulse: (!) 112  Weight: 91.6 kg  Height: 5\' 6"  (1.676 m)    GEN- The patient is a well appearing obese female, alert and oriented x 3 today.   Head- normocephalic, atraumatic Eyes-  Sclera clear, conjunctiva pink Ears- hearing intact Oropharynx- clear Neck- supple  Lungs- Clear to ausculation bilaterally, normal work of breathing Heart- irregular rate and rhythm, no murmurs, rubs or gallops  GI- soft, NT, ND, + BS Extremities- no clubbing, cyanosis, or edema MS- no significant deformity or atrophy Skin- no rash or lesion Psych- euthymic mood, full affect Neuro-  strength and sensation are intact  Wt Readings from Last 3 Encounters:  03/02/21 91.6 kg  02/23/21 93.9 kg  02/23/21 93.9 kg    EKG today demonstrates  Afib Vent. rate 112 BPM PR interval * ms QRS duration 80 ms QT/QTcB 320/436 ms  Echo 06/09/17 demonstrated  - Left ventricle: The cavity size was normal. Systolic function was    normal. The estimated ejection fraction was in the range of 60%    to 65%. Wall motion was normal; there were no regional wall    motion abnormalities. Doppler parameters are consistent with    abnormal left ventricular relaxation (grade 1 diastolic    dysfunction).  - Mitral valve: Calcified annulus. Mildly thickened leaflets .  - Left atrium: The atrium was mildly dilated. Volume/bsa, S: 36.3    ml/m^2.  - Tricuspid valve: There was mild regurgitation.  - Pulmonary arteries: Systolic pressure was mildly increased. PA    peak pressure: 38 mm Hg (S).   Epic records are reviewed at length today  CHA2DS2-VASc Score = 2  The patient's score is based upon: CHF History: 0 HTN History: 0 Diabetes History: 0 Stroke History: 0 Vascular Disease History: 0 Age Score: 1 Gender Score: 1      ASSESSMENT AND PLAN: 1. Persistent Atrial Fibrillation (ICD10:  I48.19) The patient's CHA2DS2-VASc score is 2, indicating a 2.2% annual risk of stroke.   General education about afib provided and questions answered. We also discussed her stroke risk and the risks and benefits of anticoagulation. Increase Toprol 25 mg BID for better rate control.  Continue Eliquis 5 mg BID.  Will plan for DCCV after 3 weeks of uninterrupted anticoagulation.   2. Obesity Body mass index is 32.6 kg/m. Lifestyle modification was discussed at length including regular exercise and weight reduction.    Follow up with Dr Harriet Masson as scheduled post DCCV.    [facility] [address] [place], [place] [address] [phone] 03/02/2021 2:05  PM

## 2021-03-09 ENCOUNTER — Encounter (HOSPITAL_COMMUNITY): Payer: Self-pay | Admitting: Cardiology

## 2021-03-09 NOTE — Progress Notes (Signed)
Attempted to obtain medical history via telephone, unable to reach at this time. I left a voicemail to return pre surgical testing department's phone call.  

## 2021-03-18 ENCOUNTER — Encounter (HOSPITAL_COMMUNITY)
Admission: RE | Disposition: A | Discharge status: Home / Self Care | Payer: Self-pay | Source: Home / Self Care | Attending: Cardiology

## 2021-03-18 ENCOUNTER — Encounter (HOSPITAL_COMMUNITY): Payer: Self-pay | Admitting: Cardiology

## 2021-03-18 ENCOUNTER — Ambulatory Visit (HOSPITAL_COMMUNITY)
Admission: RE | Admit: 2021-03-18 | Discharge: 2021-03-18 | Disposition: A | Discharge status: Home / Self Care | Payer: Medicare Other | Attending: Cardiology | Admitting: Cardiology

## 2021-03-18 ENCOUNTER — Ambulatory Visit (HOSPITAL_COMMUNITY)
Admission: RE | Admit: 2021-03-18 | Discharge: 2021-03-18 | Disposition: A | Discharge status: Home / Self Care | Payer: Medicare Other | Source: Ambulatory Visit | Attending: Physician Assistant | Admitting: Physician Assistant

## 2021-03-18 ENCOUNTER — Ambulatory Visit (HOSPITAL_COMMUNITY): Payer: Medicare Other | Admitting: Anesthesiology

## 2021-03-18 ENCOUNTER — Other Ambulatory Visit: Payer: Self-pay

## 2021-03-18 DIAGNOSIS — Z7901 Long term (current) use of anticoagulants: Secondary | ICD-10-CM | POA: Insufficient documentation

## 2021-03-18 DIAGNOSIS — Z6832 Body mass index (BMI) 32.0-32.9, adult: Secondary | ICD-10-CM | POA: Diagnosis not present

## 2021-03-18 DIAGNOSIS — Z7984 Long term (current) use of oral hypoglycemic drugs: Secondary | ICD-10-CM | POA: Diagnosis not present

## 2021-03-18 DIAGNOSIS — K219 Gastro-esophageal reflux disease without esophagitis: Secondary | ICD-10-CM | POA: Diagnosis not present

## 2021-03-18 DIAGNOSIS — J302 Other seasonal allergic rhinitis: Secondary | ICD-10-CM | POA: Diagnosis not present

## 2021-03-18 DIAGNOSIS — E669 Obesity, unspecified: Secondary | ICD-10-CM | POA: Insufficient documentation

## 2021-03-18 DIAGNOSIS — I4819 Other persistent atrial fibrillation: Secondary | ICD-10-CM | POA: Diagnosis not present

## 2021-03-18 HISTORY — PX: CARDIOVERSION: SHX1299

## 2021-03-18 LAB — CBC
HCT: 45.5 % (ref 36.0–46.0)
Hemoglobin: 14.7 g/dL (ref 12.0–15.0)
MCH: 29.2 pg (ref 26.0–34.0)
MCHC: 32.3 g/dL (ref 30.0–36.0)
MCV: 90.5 fL (ref 80.0–100.0)
Platelets: 203 10*3/uL (ref 150–400)
RBC: 5.03 MIL/uL (ref 3.87–5.11)
RDW: 14.1 % (ref 11.5–15.5)
WBC: 6.1 10*3/uL (ref 4.0–10.5)
nRBC: 0 % (ref 0.0–0.2)

## 2021-03-18 LAB — BASIC METABOLIC PANEL
Anion gap: 6 (ref 5–15)
BUN: 15 mg/dL (ref 8–23)
CO2: 26 mmol/L (ref 22–32)
Calcium: 9.3 mg/dL (ref 8.9–10.3)
Chloride: 107 mmol/L (ref 98–111)
Creatinine, Ser: 0.69 mg/dL (ref 0.44–1.00)
GFR, Estimated: >60 mL/min (ref >60)
Glucose, Bld: 112 mg/dL — ABNORMAL HIGH (ref 70–99)
Potassium: 4.8 mmol/L (ref 3.5–5.1)
Sodium: 139 mmol/L (ref 135–145)

## 2021-03-18 LAB — GLUCOSE, CAPILLARY: Glucose-Capillary: 94 mg/dL (ref 70–99)

## 2021-03-18 SURGERY — CARDIOVERSION
Anesthesia: General

## 2021-03-18 MED ORDER — PROPOFOL 10 MG/ML IV BOLUS
INTRAVENOUS | Status: DC | PRN
  Administered 2021-03-18: 60 mg via INTRAVENOUS

## 2021-03-18 MED ORDER — LIDOCAINE 2% (20 MG/ML) 5 ML SYRINGE
INTRAMUSCULAR | Status: DC | PRN
  Administered 2021-03-18: 20 mg via INTRAVENOUS

## 2021-03-18 MED ORDER — SODIUM CHLORIDE 0.9 % IV SOLN: INTRAVENOUS | Status: DC

## 2021-03-18 NOTE — Anesthesia Preprocedure Evaluation (Signed)
Anesthesia Evaluation  Patient identified by MRN, date of birth, ID band Patient awake    Reviewed: Allergy & Precautions, NPO status , Patient's Chart, lab work & pertinent test results  Airway Mallampati: II  TM Distance: >3 FB Neck ROM: Full    Dental   Pulmonary neg pulmonary ROS,    breath sounds clear to auscultation       Cardiovascular negative cardio ROS   Rhythm:Irregular Rate:Normal     Neuro/Psych negative neurological ROS     GI/Hepatic Neg liver ROS, GERD  ,  Endo/Other  negative endocrine ROS  Renal/GU negative Renal ROS     Musculoskeletal  (+) Arthritis ,   Abdominal   Peds  Hematology negative hematology ROS (+)   Anesthesia Other Findings   Reproductive/Obstetrics                             Anesthesia Physical Anesthesia Plan  ASA: 2  Anesthesia Plan: General   Post-op Pain Management:    Induction:   PONV Risk Score and Plan:   Airway Management Planned: Natural Airway and Mask  Additional Equipment:   Intra-op Plan:   Post-operative Plan: Extubation in OR  Informed Consent: I have reviewed the patients History and Physical, chart, labs and discussed the procedure including the risks, benefits and alternatives for the proposed anesthesia with the patient or authorized representative who has indicated his/her understanding and acceptance.       Plan Discussed with: CRNA  Anesthesia Plan Comments:         Anesthesia Quick Evaluation

## 2021-03-18 NOTE — Procedures (Signed)
Electrical Cardioversion Procedure Note CENIYAH THORP [id number] [birth year]  Procedure: Electrical Cardioversion Indications:  Atrial Fibrillation  Procedure Details Consent: Risks of procedure as well as the alternatives and risks of each were explained to the (patient/caregiver).  Consent for procedure obtained. Time Out: Verified patient identification, verified procedure, site/side was marked, verified correct patient position, special equipment/implants available, medications/allergies/relevent history reviewed, required imaging and test results available.  Performed  Patient placed on cardiac monitor, pulse oximetry, supplemental oxygen as necessary.  Sedation given:  Pt sedated by anesthesia with lidocaine 20 mg and diprovan 60 mg IV. Pacer pads placed anterior and posterior chest.  Cardioverted 1 time(s).  Cardioverted at [place].  Evaluation Findings: Post procedure EKG shows: NSR Complications: None Patient did tolerate procedure well.   Kirk Ruths 03/18/2021, 9:45 AM

## 2021-03-18 NOTE — Interval H&P Note (Signed)
History and Physical Interval Note:  03/18/2021 9:44 AM  Bethany Frederick  has presented today for surgery, with the diagnosis of AFIB.  The various methods of treatment have been discussed with the patient and family. After consideration of risks, benefits and other options for treatment, the patient has consented to  Procedure(s): CARDIOVERSION (N/A) as a surgical intervention.  The patient's history has been reviewed, patient examined, no change in status, stable for surgery.  I have reviewed the patient's chart and labs.  Questions were answered to the patient's satisfaction.     Kirk Ruths

## 2021-03-18 NOTE — Discharge Instructions (Signed)
Electrical Cardioversion  Electrical cardioversion is the delivery of a jolt of electricity to restore a normal rhythm to the heart. A rhythm that is too fast or is not regular keeps the heart from pumping well. In this procedure, sticky patches or metal paddles are placed on the chest to deliver electricity to the heart from a device.  What can I expect after the procedure?  Your blood pressure, heart rate, breathing rate, and blood oxygen level will be monitored until you leave the hospital or clinic.  Your heart rhythm will be watched to make sure it does not change.  You may have some redness on the skin where the shocks were given.If this occurs, can use hydrocortisone cream or Aloe vera.  Follow these instructions at home:  Do not drive for 24 hours if you were given a sedative during your procedure.  Take over-the-counter and prescription medicines only as told by your health care provider.  Ask your health care provider how to check your pulse. Check it often.  Rest for 48 hours after the procedure or as told by your health care provider.  Avoid or limit your caffeine use as told by your health care provider.  Keep all follow-up visits as told by your health care provider. This is important.  Contact a health care provider if:  You feel like your heart is beating too quickly or your pulse is not regular.  You have a serious muscle cramp that does not go away.  Get help right away if:  You have discomfort in your chest.  You are dizzy or you feel faint.  You have trouble breathing or you are short of breath.  Your speech is slurred.  You have trouble moving an arm or leg on one side of your body.  Your fingers or toes turn cold or blue.  Summary  Electrical cardioversion is the delivery of a jolt of electricity to restore a normal rhythm to the heart.  This procedure may be done right away in an emergency or may be a scheduled procedure if the condition is not  an emergency.  Generally, this is a safe procedure.  After the procedure, check your pulse often as told by your health care provider.  This information is not intended to replace advice given to you by your health care provider. Make sure you discuss any questions you have with your health care provider. Document Revised: 11/26/2018 Document Reviewed: 11/26/2018 Elsevier Patient Education  2021 [employer]  

## 2021-03-18 NOTE — H&P (Signed)
ATRIAL FIB OFFICE VISIT 03/02/2021 [facility] Fenton, [place] R, PA Cardiology Persistent atrial fibrillation [facility]) Dx Referred by [name], MD Reason for Visit   Additional Documentation  Vitals:  BP 132/98 Important   Pulse 112 Important   Ht 5\' 6"  (1.676 m) Wt 91.6 kg BMI 32.60 kg/m BSA 2.07 m  Flowsheets:  NEWS, MEWS Score, Anthropometrics, CHADS2-VASc Score, Method of Visit   Encounter Info:  Billing Info, History, Allergies, Detailed Report    All Notes    Progress Notes by Oliver Barre, PA at 03/02/2021 2:00 PM  Author: Oliver Barre, PA Author Type: Physician Assistant Filed: 03/02/2021  4:00 PM  Note Status: Signed Cosign: Cosign Not Required Date of Service: 03/02/2021  2:00 PM  Editor: Oliver Barre, PA (Physician Assistant)                    Primary Care Physician: [name], MD Primary Cardiologist: Dr Harriet Masson (new) Primary Electrophysiologist: none Referring Physician: [facility]     Bethany Frederick is a 70 y.o. female with a history of colorectal cancer s/p resection and atrial fibrillation who presents for consultation in the [facility].  The patient was initially told she had an "abnormal heart rhythm" at the time of her colon cancer diagnosis in 2017. Never formally diagnosed with afib until her visit with her GI specialist on 02/23/21. Her physical exam revealed an irregular rhythm and ECG showed afib with rapid rates. Patient was asymptomatic. She was sent to the ED for evaluation. Patient was started on Eliquis for a CHADS2VASC score of 2 and metoprolol for rate control. She has several family members with afib. She denies snoring or significant alcohol use.    Today, she denies symptoms of palpitations, chest pain, shortness of breath, orthopnea, PND, lower extremity edema, dizziness, presyncope, syncope, snoring, daytime somnolence, bleeding, or neurologic sequela. The  patient is tolerating medications without difficulties and is otherwise without complaint today.      Atrial Fibrillation Risk Factors:   she does not have symptoms or diagnosis of sleep apnea. she does not have a history of rheumatic fever. she does not have a history of alcohol use. The patient does have a history of early familial atrial fibrillation or other arrhythmias. Father and several brothers have afib.   she has a BMI of Body mass index is 32.6 kg/m.Marland Kitchen    Filed Weights    03/02/21 1357  Weight: 91.6 kg           Family History  Problem Relation Age of Onset   Cancer Brother 59        prostate cancer    Cancer Paternal Aunt 102        breast cancer    Cancer Cousin 92        ovarian and breast cancer (maternal cousin)         Atrial Fibrillation Management history:   Previous antiarrhythmic drugs: none Previous cardioversions: none Previous ablations: none CHADS2VASC score: 2 Anticoagulation history: Eliquis         Past Medical History:  Diagnosis Date   Arthritis      "mainly in my back, feet, hips" (03/09/2017)   Cancer of sigmoid colon ([place]) 2017   Cataract, immature     GERD (gastroesophageal reflux disease)     Heart palpitations     History of blood transfusion 2002    "related to endometrosis"  Sclerosing adenosis of left breast 02/2015   Seasonal allergies     Wears partial dentures      upper front - 1 tooth         Past Surgical History:  Procedure Laterality Date   APPENDECTOMY       BREAST BIOPSY Left 2016   BREAST LUMPECTOMY Left 03/2015   BREAST LUMPECTOMY WITH RADIOACTIVE SEED LOCALIZATION Left 03/13/2015    Procedure: BREAST LUMPECTOMY WITH RADIOACTIVE SEED LOCALIZATION;  Surgeon: Autumn Messing III, MD;  Location: [facility];  Service: General;  Laterality: Left;   COLECTOMY       COLONOSCOPY       ENDOMETRIAL FULGURATION   06/13/2000   HERNIA REPAIR       INGUINAL HERNIA REPAIR   03/05/2012    Procedure: HERNIA  REPAIR INGUINAL INCARCERATED;  Surgeon: Ralene Ok, MD;  Location: [facility];  Service: General;  Laterality: left;   INGUINAL HERNIA REPAIR Left     INSERTION OF MESH   03/05/2012    Procedure: INSERTION OF MESH;  Surgeon: Ralene Ok, MD;  Location: [place];  Service: General;  Laterality: Left;   INSERTION OF MESH N/A 03/09/2017    Procedure: INSERTION OF MESH;  Surgeon: Jovita Kussmaul, MD;  Location: [place];  Service: General;  Laterality: N/A;   LAPAROSCOPIC ASSISTED VENTRAL HERNIA REPAIR   03/09/2017    w/mesh   LAPAROSCOPIC SIGMOID COLECTOMY Bilateral 01/28/2016    Procedure: LAPAROSCOPIC ASSISTED SIGMOID COLECTOMY;  Surgeon: Autumn Messing III, MD;  Location: [place];  Service: General;  Laterality: Bilateral;   MYOMECTOMY   06/13/2000   PORT-A-CATH REMOVAL Right 03/09/2017   PORT-A-CATH REMOVAL N/A 03/09/2017    Procedure: REMOVAL PORT-A-CATH RIGHT CHEST;  Surgeon: Jovita Kussmaul, MD;  Location: [facility];  Service: General;  Laterality: N/A;   PORTACATH PLACEMENT N/A 02/18/2016    Procedure: INSERTION PORT-A-CATH;  Surgeon: Autumn Messing III, MD;  Location: [place];  Service: General;  Laterality: N/A;   SHOULDER ARTHROSCOPY W/ ROTATOR CUFF REPAIR Left 2013   VENTRAL HERNIA REPAIR N/A 03/09/2017    Procedure: LAPAROSCOPIC VENTRAL HERNIA REPAIR;  Surgeon: Autumn Messing III, MD;  Location: [place];  Service: General;  Laterality: N/A;            Current Outpatient Medications  Medication Sig Dispense Refill   apixaban (ELIQUIS) 5 MG TABS tablet Take 1 tablet (5 mg total) by mouth 2 (two) times daily. 60 tablet 0   clobetasol cream (TEMOVATE) 0.17 % Apply 1 application topically 2 (two) times daily as needed (RASH).        diclofenac Sodium (VOLTAREN) 1 % GEL Apply 2 g topically 4 (four) times daily.       famotidine (PEPCID) 20 MG tablet Take 20 mg by mouth daily as needed for heartburn or indigestion.    0   fluocinonide (LIDEX) 0.05 % external solution SMARTSIG:1 Application Topical  Every Night PRN       ibuprofen (ADVIL,MOTRIN) 200 MG tablet Take 400 mg by mouth 2 (two) times daily as needed for mild pain.       metFORMIN (GLUCOPHAGE-XR) 500 MG 24 hr tablet Take 500 mg by mouth 2 (two) times daily.       metoprolol succinate (TOPROL-XL) 25 MG 24 hr tablet Take 0.5 tablets (12.5 mg total) by mouth daily. 30 tablet 0   Multiple Vitamins-Minerals (PRESERVISION AREDS 2+MULTI VIT PO) Take by mouth.  NEOMYCIN-POLYMYXIN-HYDROCORTISONE (CORTISPORIN) 1 % SOLN OTIC solution neomycin-polymyxin-hydrocort 3.5 mg/mL-10,000 unit/mL-1 % ear solution  INSTILL 4 DROPS INTO AFFECTED EAR 3 TIMES A DAY       Polyethyl Glycol-Propyl Glycol (SYSTANE OP) Apply 1 drop to eye 2 (two) times daily.       triamcinolone cream (KENALOG) 0.1 % PLEASE SEE ATTACHED FOR DETAILED DIRECTIONS       Vitamin D, Ergocalciferol, (DRISDOL) 50000 units CAPS capsule Take 1.25 Units by mouth once a week.        No current facility-administered medications for this encounter.             Facility-Administered Medications Ordered in Other Encounters  Medication Dose Route Frequency Provider Last Rate Last Admin   sodium chloride flush (NS) 0.9 % injection 10 mL  10 mL Intravenous PRN Truitt Merle, MD   10 mL at 05/31/16 1303      No Known Allergies     Social History         Socioeconomic History   Marital status: Married      Spouse name: Not on file   Number of children: Not on file   Years of education: Not on file   Highest education level: Not on file  Occupational History   Not on file  Tobacco Use   Smoking status: Never   Smokeless tobacco: Never  Vaping Use   Vaping Use: Never used  Substance and Sexual Activity   Alcohol use: Not Currently      Alcohol/week: 7.0 standard drinks      Types: 7 Glasses of wine per week   Drug use: No   Sexual activity: Not Currently  Other Topics Concern   Not on file  Social History Narrative   Not on file    Social Determinants of Health     Financial Resource Strain: Not on file  Food Insecurity: Not on file  Transportation Needs: Not on file  Physical Activity: Not on file  Stress: Not on file  Social Connections: Not on file  Intimate Partner Violence: Not on file        ROS- All systems are reviewed and negative except as per the HPI above.   Physical Exam:    Vitals:    03/02/21 1357  BP: (!) 132/98  Pulse: (!) 112  Weight: 91.6 kg  Height: 5\' 6"  (1.676 m)      GEN- The patient is a well appearing obese female, alert and oriented x 3 today.   Head- normocephalic, atraumatic Eyes-  Sclera clear, conjunctiva pink Ears- hearing intact Oropharynx- clear Neck- supple  Lungs- Clear to ausculation bilaterally, normal work of breathing Heart- irregular rate and rhythm, no murmurs, rubs or gallops  GI- soft, NT, ND, + BS Extremities- no clubbing, cyanosis, or edema MS- no significant deformity or atrophy Skin- no rash or lesion Psych- euthymic mood, full affect Neuro- strength and sensation are intact      Wt Readings from Last 3 Encounters:  03/02/21 91.6 kg  02/23/21 93.9 kg  02/23/21 93.9 kg      EKG today demonstrates  Afib Vent. rate 112 BPM PR interval * ms QRS duration 80 ms QT/QTcB 320/436 ms   Echo 06/09/17 demonstrated  - Left ventricle: The cavity size was normal. Systolic function was    normal. The estimated ejection fraction was in the range of 60%    to 65%. Wall motion was normal; there were no regional wall    motion  abnormalities. Doppler parameters are consistent with    abnormal left ventricular relaxation (grade 1 diastolic    dysfunction).  - Mitral valve: Calcified annulus. Mildly thickened leaflets .  - Left atrium: The atrium was mildly dilated. Volume/bsa, S: 36.3    ml/m^2.  - Tricuspid valve: There was mild regurgitation.  - Pulmonary arteries: Systolic pressure was mildly increased. PA    peak pressure: 38 mm Hg (S).    Epic records are reviewed at length today    CHA2DS2-VASc Score = 2  The patient's score is based upon: CHF History: 0 HTN History: 0 Diabetes History: 0 Stroke History: 0 Vascular Disease History: 0 Age Score: 1 Gender Score: 1        ASSESSMENT AND PLAN: 1. Persistent Atrial Fibrillation (ICD10:  I48.19) The patient's CHA2DS2-VASc score is 2, indicating a 2.2% annual risk of stroke.   General education about afib provided and questions answered. We also discussed her stroke risk and the risks and benefits of anticoagulation. Increase Toprol 25 mg BID for better rate control.  Continue Eliquis 5 mg BID.  Will plan for DCCV after 3 weeks of uninterrupted anticoagulation.    2. Obesity Body mass index is 32.6 kg/m. Lifestyle modification was discussed at length including regular exercise and weight reduction.       Follow up with Dr Harriet Masson as scheduled post DCCV.      [facility] [place], [place] [id number] [phone] 03/02/2021 2:05 PM      For DCCV; compliant with apixaban; no changes. Kirk Ruths

## 2021-03-18 NOTE — Transfer of Care (Signed)
Immediate Anesthesia Transfer of Care Note  Patient: Bethany Frederick  Procedure(s) Performed: CARDIOVERSION  Patient Location: Endoscopy Unit  Anesthesia Type:General  Level of Consciousness: drowsy  Airway & Oxygen Therapy: Patient Spontanous Breathing  Post-op Assessment: Report given to RN and Post -op Vital signs reviewed and stable  Post vital signs: Reviewed and stable  Last Vitals:  Vitals Value Taken Time  BP 97/75 (81)   Temp    Pulse 90   Resp 16   SpO2 96%     Last Pain:  Vitals:   03/18/21 0943  TempSrc: Temporal  PainSc: 0-No pain         Complications: No notable events documented.

## 2021-03-19 ENCOUNTER — Encounter (HOSPITAL_COMMUNITY): Payer: Self-pay | Admitting: Cardiology

## 2021-03-19 NOTE — Anesthesia Postprocedure Evaluation (Signed)
Anesthesia Post Note  Patient: Bethany Frederick  Procedure(s) Performed: CARDIOVERSION     Patient location during evaluation: PACU Anesthesia Type: General Level of consciousness: awake and alert Pain management: pain level controlled Vital Signs Assessment: post-procedure vital signs reviewed and stable Respiratory status: spontaneous breathing, nonlabored ventilation, respiratory function stable and patient connected to nasal cannula oxygen Cardiovascular status: blood pressure returned to baseline and stable Postop Assessment: no apparent nausea or vomiting Anesthetic complications: no   No notable events documented.  Last Vitals:  Vitals:   03/18/21 1035 03/18/21 1046  BP: 115/62 121/62  Pulse: 79   Resp: 20 20  Temp:    SpO2: 95% 96%    Last Pain:  Vitals:   03/18/21 1046  TempSrc:   PainSc: 0-No pain                 Tiajuana Amass

## 2021-03-30 ENCOUNTER — Ambulatory Visit (INDEPENDENT_AMBULATORY_CARE_PROVIDER_SITE_OTHER): Payer: Medicare Other | Admitting: Cardiology

## 2021-03-30 ENCOUNTER — Ambulatory Visit (INDEPENDENT_AMBULATORY_CARE_PROVIDER_SITE_OTHER): Payer: Medicare Other

## 2021-03-30 ENCOUNTER — Encounter: Payer: Self-pay | Admitting: Cardiology

## 2021-03-30 ENCOUNTER — Encounter: Payer: Self-pay | Admitting: Hematology

## 2021-03-30 ENCOUNTER — Other Ambulatory Visit: Payer: Self-pay

## 2021-03-30 VITALS — BP 120/82 | HR 93 | Ht 66.0 in | Wt 201.2 lb

## 2021-03-30 DIAGNOSIS — E119 Type 2 diabetes mellitus without complications: Secondary | ICD-10-CM | POA: Insufficient documentation

## 2021-03-30 DIAGNOSIS — E0800 Diabetes mellitus due to underlying condition with hyperosmolarity without nonketotic hyperglycemic-hyperosmolar coma (NKHHC): Secondary | ICD-10-CM | POA: Diagnosis not present

## 2021-03-30 DIAGNOSIS — I480 Paroxysmal atrial fibrillation: Secondary | ICD-10-CM | POA: Diagnosis not present

## 2021-03-30 MED ORDER — METOPROLOL SUCCINATE ER 25 MG PO TB24: ORAL_TABLET | ORAL | 3 refills | Status: DC

## 2021-03-30 NOTE — Progress Notes (Unsigned)
Enrolled patient for a 14 day Zio XT  monitor to be mailed to patients home  °

## 2021-03-30 NOTE — Patient Instructions (Signed)
Medication Instructions:  Your physician has recommended you make the following change in your medication:  START: Metoprolol succinate 25 mg in the morning and 12.5 mg at night *If you need a refill on your cardiac medications before your next appointment, please call your pharmacy*   Lab Work: Your physician recommends that you return for lab work in:  START: TSH+T3+T4 If you have labs (blood work) drawn today and your tests are completely normal, you will receive your results only by: [place] (if you have MyChart) OR A paper copy in the mail If you have any lab test that is abnormal or we need to change your treatment, we will call you to review the results.   Testing/Procedures: Your physician has requested that you have an echocardiogram. Echocardiography is a painless test that uses sound waves to create images of your heart. It provides your doctor with information about the size and shape of your heart and how well your heart's chambers and valves are working. This procedure takes approximately one hour. There are no restrictions for this procedure.  ZIO XT- Long Term Monitor Instructions  Your physician has requested you wear a ZIO patch monitor for 14 days.  This is a single patch monitor. [employer] supplies one patch monitor per enrollment. Additional stickers are not available. Please do not apply patch if you will be having a Nuclear Stress Test,  Echocardiogram, Cardiac CT, MRI, or Chest Xray during the period you would be wearing the  monitor. The patch cannot be worn during these tests. You cannot remove and re-apply the  ZIO XT patch monitor.  Your ZIO patch monitor will be mailed 3 day USPS to your address on file. It may take 3-5 days  to receive your monitor after you have been enrolled.  Once you have received your monitor, please review the enclosed instructions. Your monitor  has already been registered assigning a specific monitor serial # to you.  Billing  and Patient Assistance Program Information  We have supplied [employer] with any of your insurance information on file for billing purposes. [employer] offers a sliding scale Patient Assistance Program for patients that do not have  insurance, or whose insurance does not completely cover the cost of the ZIO monitor.  You must apply for the Patient Assistance Program to qualify for this discounted rate.  To apply, please call [employer] at [phone], select option 4, select option 2, ask to apply for  Patient Assistance Program. Theodore Demark will ask your household income, and how many people  are in your household. They will quote your out-of-pocket cost based on that information.  [employer] will also be able to set up a 96-month, interest-free payment plan if needed.  Applying the monitor   Shave hair from upper left chest.  Hold abrader disc by orange tab. Rub abrader in 40 strokes over the upper left chest as  indicated in your monitor instructions.  Clean area with 4 enclosed alcohol pads. Let dry.  Apply patch as indicated in monitor instructions. Patch will be placed under collarbone on left  side of chest with arrow pointing upward.  Rub patch adhesive wings for 2 minutes. Remove white label marked "1". Remove the white  label marked "2". Rub patch adhesive wings for 2 additional minutes.  While looking in a mirror, press and release button in center of patch. A small green light will  flash 3-4 times. This will be your only indicator that the monitor has been turned on.  Do not shower for the first 24 hours. You may shower after the first 24 hours.  Press the button if you feel a symptom. You will hear a small click. Record Date, Time and  Symptom in the Patient Logbook.  When you are ready to remove the patch, follow instructions on the last 2 pages of Patient  Logbook. Stick patch monitor onto the last page of Patient Logbook.  Place Patient Logbook in the blue and white box. Use locking tab  on box and tape box closed  securely. The blue and white box has prepaid postage on it. Please place it in the mailbox as  soon as possible. Your physician should have your test results approximately 7 days after the  monitor has been mailed back to [facility] [employer]  Call [facility] at [phone] if you have questions regarding  your ZIO XT patch monitor. Call them immediately if you see an orange light blinking on your  monitor.  If your monitor falls off in less than 4 days, contact our Monitor department at [phone].  If your monitor becomes loose or falls off after 4 days call [employer] at [phone] for  suggestions on securing your monitor    Follow-Up: At [facility], you and your health needs are our priority.  As part of our continuing mission to provide you with exceptional heart care, we have created designated Provider Care Teams.  These Care Teams include your primary Cardiologist (physician) and Advanced Practice Providers (APPs -  Physician Assistants and Nurse Practitioners) who all work together to provide you with the care you need, when you need it.  We recommend signing up for the patient portal called "MyChart".  Sign up information is provided on this After Visit Summary.  MyChart is used to connect with patients for Virtual Visits (Telemedicine).  Patients are able to view lab/test results, encounter notes, upcoming appointments, etc.  Non-urgent messages can be sent to your provider as well.   To learn more about what you can do with MyChart, go to [email or web address].    Your next appointment:   3 month(s)  The format for your next appointment:   In Person  Provider:   Berniece Salines, DO    Other Instructions

## 2021-03-30 NOTE — Progress Notes (Signed)
Cardiology Office Note:    Date:  03/30/2021   ID:  Bethany Frederick, DOB [birth year], MRN [id number]  PCP:  [name], MD  Cardiologist:  None  Electrophysiologist:  None   Referring MD: Alla Feeling, NP   ' I am doing well"  History of Present Illness:    Bethany Frederick is a 70 y.o. female with a hx of recently diagnosed paroxysmal atrial fibrillation on metoprolol succinate and Eliquis, diabetes mellitus, presents today to establish general cardiology care.  The patient tells me that she was diagnosed with atrial fibrillation few weeks ago at which time she was seen in our A. fib clinic she was started on Eliquis after completing 3 weeks of this she was scheduled for cardioversion.  The patient notes that she noticed that she was probably back in atrial fibrillation after she had her news about her brother who was placed on hospice and her heartbeat started to feel different.  She denies any chest pain or shortness of breath.  She does have some fatigue.   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   Seasonal allergies    Wears partial dentures    upper front - 1 tooth    Past Surgical History:  Procedure Laterality Date   APPENDECTOMY     BREAST BIOPSY Left 2016   BREAST LUMPECTOMY Left 03/2015   BREAST LUMPECTOMY WITH RADIOACTIVE SEED LOCALIZATION Left 03/13/2015   Procedure: BREAST LUMPECTOMY WITH RADIOACTIVE SEED LOCALIZATION;  Surgeon: Autumn Messing III, MD;  Location: [facility];  Service: General;  Laterality: Left;   CARDIOVERSION N/A 03/18/2021   Procedure: CARDIOVERSION;  Surgeon: Lelon Perla, MD;  Location: [facility];   Service: Cardiovascular;  Laterality: N/A;   COLECTOMY     COLONOSCOPY     ENDOMETRIAL FULGURATION  06/13/2000   HERNIA REPAIR     INGUINAL HERNIA REPAIR  03/05/2012   Procedure: HERNIA REPAIR INGUINAL INCARCERATED;  Surgeon: Ralene Ok, MD;  Location: [facility];  Service: General;  Laterality: left;   INGUINAL HERNIA REPAIR Left    INSERTION OF MESH  03/05/2012   Procedure: INSERTION OF MESH;  Surgeon: Ralene Ok, MD;  Location: [facility];  Service: General;  Laterality: Left;   INSERTION OF MESH N/A 03/09/2017   Procedure: INSERTION OF MESH;  Surgeon: Jovita Kussmaul, MD;  Location: [facility];  Service: General;  Laterality: N/A;   LAPAROSCOPIC ASSISTED VENTRAL HERNIA REPAIR  03/09/2017   w/mesh   LAPAROSCOPIC SIGMOID COLECTOMY Bilateral 01/28/2016   Procedure: LAPAROSCOPIC ASSISTED SIGMOID COLECTOMY;  Surgeon: Autumn Messing III, MD;  Location: [facility];  Service: General;  Laterality: Bilateral;   MYOMECTOMY  06/13/2000   PORT-A-CATH REMOVAL Right 03/09/2017   PORT-A-CATH REMOVAL N/A 03/09/2017   Procedure: REMOVAL PORT-A-CATH RIGHT CHEST;  Surgeon: Jovita Kussmaul, MD;  Location: [facility];  Service: General;  Laterality: N/A;   PORTACATH PLACEMENT N/A 02/18/2016   Procedure: INSERTION PORT-A-CATH;  Surgeon: Autumn Messing III, MD;  Location: [facility];  Service: General;  Laterality: N/A;   SHOULDER ARTHROSCOPY W/ ROTATOR CUFF REPAIR Left 2013  VENTRAL HERNIA REPAIR N/A 03/09/2017   Procedure: LAPAROSCOPIC VENTRAL HERNIA REPAIR;  Surgeon: Autumn Messing III, MD;  [facility];  Service: General;  Laterality: N/A;    Current Medications: Current Meds  Medication Sig   acetaminophen (TYLENOL) 500 MG tablet Take 500 mg by mouth every 6 (six) hours as needed (pain).   apixaban (ELIQUIS) 5 MG TABS tablet Take 1 tablet (5 mg total) by mouth 2 (two) times daily.   clobetasol cream (TEMOVATE) 0.27 % Apply 1 application topically 2 (two) times daily as needed (skin irritation/rash).   cycloSPORINE  (RESTASIS) 0.05 % ophthalmic emulsion Place 1 drop into both eyes 2 (two) times daily.   famotidine (PEPCID) 20 MG tablet Take 20 mg by mouth daily as needed for heartburn or indigestion.    fluocinonide (LIDEX) 0.05 % external solution Apply 1 application topically daily as needed (dermatitis/skin irritation.).   metFORMIN (GLUCOPHAGE-XR) 500 MG 24 hr tablet Take 500 mg by mouth 2 (two) times daily.   metoprolol succinate (TOPROL XL) 25 MG 24 hr tablet Take 25 mg (one tablet) in the morning and 12.5 mg (half a tablet) at night.   Multiple Vitamins-Minerals (PRESERVISION AREDS 2+MULTI VIT PO) Take 1 tablet by mouth 3 (three) times a week.   Polyethyl Glycol-Propyl Glycol (SYSTANE OP) Apply 1 drop to eye 2 (two) times daily.   triamcinolone cream (KENALOG) 0.1 % Apply 1 application topically 2 (two) times daily as needed (skin irritation/rash).   Vitamin D, Ergocalciferol, (DRISDOL) 50000 units CAPS capsule Take 50,000 Units by mouth once a week.   [DISCONTINUED] metoprolol succinate (TOPROL-XL) 25 MG 24 hr tablet Take 1 tablet (25 mg total) by mouth daily.     Allergies:   Patient has no known allergies.   Social History   Socioeconomic History   Marital status: Married    Spouse name: Not on file   Number of children: Not on file   Years of education: Not on file   Highest education level: Not on file  Occupational History   Not on file  Tobacco Use   Smoking status: Never   Smokeless tobacco: Never  Vaping Use   Vaping Use: Never used  Substance and Sexual Activity   Alcohol use: Not Currently    Alcohol/week: 7.0 standard drinks    Types: 7 Glasses of wine per week   Drug use: No   Sexual activity: Not Currently  Other Topics Concern   Not on file  Social History Narrative   Not on file   Social Determinants of Health   Financial Resource Strain: Not on file  Food Insecurity: Not on file  Transportation Needs: Not on file  Physical Activity: Not on file  Stress: Not  on file  Social Connections: Not on file     Family History: The patient's family history includes Cancer (age of onset: 54) in her paternal aunt; Cancer (age of onset: 51) in her cousin; Cancer (age of onset: 66) in her brother.  ROS:   Review of Systems  Constitution: Negative for decreased appetite, fever and weight gain.  HENT: Negative for congestion, ear discharge, hoarse voice and sore throat.   Eyes: Negative for discharge, redness, vision loss in right eye and visual halos.  Cardiovascular: Negative for chest pain, dyspnea on exertion, leg swelling, orthopnea and palpitations.  Respiratory: Negative for cough, hemoptysis, shortness of breath and snoring.   Endocrine: Negative for heat intolerance and polyphagia.  Hematologic/Lymphatic: Negative for bleeding problem. Does not bruise/bleed  easily.  Skin: Negative for flushing, nail changes, rash and suspicious lesions.  Musculoskeletal: Negative for arthritis, joint pain, muscle cramps, myalgias, neck pain and stiffness.  Gastrointestinal: Negative for abdominal pain, bowel incontinence, diarrhea and excessive appetite.  Genitourinary: Negative for decreased libido, genital sores and incomplete emptying.  Neurological: Negative for brief paralysis, focal weakness, headaches and loss of balance.  Psychiatric/Behavioral: Negative for altered mental status, depression and suicidal ideas.  Allergic/Immunologic: Negative for HIV exposure and persistent infections.    EKGs/Labs/Other Studies Reviewed:    The following studies were reviewed today:   EKG:  The ekg ordered today demonstrates atrial fibrillation with controlled ventricular rate.  Echo 2019 Study Conclusions   - Left ventricle: The cavity size was normal. Systolic function was    normal. The estimated ejection fraction was in the range of 60%    to 65%. Wall motion was normal; there were no regional wall    motion abnormalities. Doppler parameters are consistent  with    abnormal left ventricular relaxation (grade 1 diastolic    dysfunction).  - Mitral valve: Calcified annulus. Mildly thickened leaflets .  - Left atrium: The atrium was mildly dilated. Volume/bsa, S: 36.3    ml/m^2.  - Tricuspid valve: There was mild regurgitation.  - Pulmonary arteries: Systolic pressure was mildly increased. PA    peak pressure: 38 mm Hg (S).   -------------------------------------------------------------------  Study data:  No prior study was available for comparison.  Study  status:  Routine.  Procedure:  The patient reported no pain pre or  post test. Transthoracic echocardiography. Image quality was  adequate.  Study completion:  There were no complications.  Transthoracic echocardiography.  M-mode, complete 2D, spectral  Doppler, and color Doppler.  Birthdate:  Patient birthdate:  [birth year].  Age:  Patient is 70 yr old.  Sex:  Gender: female.  BMI: 31.3 kg/m^2.  Blood pressure:     131/81  Patient status:  Outpatient.  Study date:  Study date: 06/09/2017. Study time: 11:36  AM.  Location:  [facility]   -------------------------------------------------------------------   -------------------------------------------------------------------  Left ventricle:  The cavity size was normal. Systolic function was  normal. The estimated ejection fraction was in the range of 60% to  65%. Wall motion was normal; there were no regional wall motion  abnormalities. Doppler parameters are consistent with abnormal left  ventricular relaxation (grade 1 diastolic dysfunction).   -------------------------------------------------------------------  Aortic valve:   Trileaflet; normal thickness leaflets. Mobility was  not restricted.  Doppler:  Transvalvular velocity was within the  normal range. There was no stenosis. There was no regurgitation.     -------------------------------------------------------------------  Aorta:  Aortic root: The aortic root was  normal in size.   -------------------------------------------------------------------  Mitral valve:   Calcified annulus. Mildly thickened leaflets .  Mobility was not restricted.  Doppler:  Transvalvular velocity was  within the normal range. There was no evidence for stenosis. There  was trivial regurgitation.    Peak gradient (D): 2 mm Hg.   -------------------------------------------------------------------  Left atrium:  The atrium was mildly dilated.   -------------------------------------------------------------------  Right ventricle:  The cavity size was normal. Wall thickness was  normal. Systolic function was normal.   -------------------------------------------------------------------  Pulmonic valve:    Structurally normal valve.   Cusp separation was  normal.  Doppler:  Transvalvular velocity was within the normal  range. There was no evidence for stenosis. There was trivial  regurgitation.   -------------------------------------------------------------------  Tricuspid valve:   Structurally normal  valve.    Doppler:  Transvalvular velocity was within the normal range. There was mild  regurgitation.   -------------------------------------------------------------------  Pulmonary artery:   The main pulmonary artery was normal-sized.  Systolic pressure was mildly increased.   -------------------------------------------------------------------  Right atrium:  The atrium was normal in size.   -------------------------------------------------------------------  Pericardium:  There was no pericardial effusion.   -------------------------------------------------------------------  Systemic veins:  Inferior vena cava: The vessel was normal in size.   -------------------------------------------------------------------  Recent Labs: 02/23/2021: ALT 22; Magnesium 1.9 03/18/2021: BUN 15; Creatinine, Ser 0.69; Hemoglobin 14.7; Platelets 203; Potassium 4.8; Sodium 139   Recent Lipid Panel No results found for: CHOL, TRIG, HDL, CHOLHDL, VLDL, LDLCALC, LDLDIRECT  Physical Exam:    VS:  BP 120/82 (BP Location: Right Arm)   Pulse 93   Ht 5\' 6"  (1.676 m)   Wt 201 lb 3.2 oz (91.3 kg)   SpO2 97%   BMI 32.47 kg/m     Wt Readings from Last 3 Encounters:  03/30/21 201 lb 3.2 oz (91.3 kg)  03/18/21 200 lb (90.7 kg)  03/02/21 202 lb (91.6 kg)     GEN: Well nourished, well developed in no acute distress HEENT: Normal NECK: No JVD; No carotid bruits LYMPHATICS: No lymphadenopathy CARDIAC: S1S2 noted,RRR, no murmurs, rubs, gallops RESPIRATORY:  Clear to auscultation without rales, wheezing or rhonchi  ABDOMEN: Soft, non-tender, non-distended, +bowel sounds, no guarding. EXTREMITIES: No edema, No cyanosis, no clubbing MUSCULOSKELETAL:  No deformity  SKIN: Warm and dry NEUROLOGIC:  Alert and oriented x 3, non-focal PSYCHIATRIC:  Normal affect, good insight  ASSESSMENT:    1. PAF (paroxysmal atrial fibrillation) ([place])   2. Diabetes mellitus due to underlying condition with hyperosmolarity without coma, without long-term current use of insulin ([facility])    PLAN:    She recently had her cardioversion unfortunately she is back in atrial fibrillation today.  I talked to the patient about this.  While talking with the patient she also tells me that over the last few days she has missed her Eliquis doses.  I did explain to the patient how important it is to continue to take her Eliquis and not have any interruption given her recent cardioversion.  She expresses understanding.  I will hold off on pursuing antiarrhythmic therapy right now to restore sinus rhythm but what I like to do is increase her beta-blocker as I do think her heart rate can tolerate it she is 93 bpm in the office today.  I will increase metoprolol succinate 25 mg in the morning and 12.5 at nighttime.  In the meantime I will like to repeat an echocardiogram as her previous echo was done in 2019 which  was actually before her diagnosis of atrial fibrillation like to reassess her LV function.  I am going to place a monitor on the patient to understand her atrial fibrillation burden.  I am hoping that the patient is able to keep on schedule for anticoagulation as it will be beneficial if we need to consider rhythm control strategies again. Wearing on a get thyroid function level to make sure this is not contributing.  The patient is in agreement with the above plan. The patient left the office in stable condition.  The patient will follow up in 3 months or sooner if needed.   Medication Adjustments/Labs and Tests Ordered: Current medicines are reviewed at length with the patient today.  Concerns regarding medicines are outlined above.  Orders Placed This Encounter  Procedures  TSH+T4F+T3Free   LONG TERM MONITOR (3-14 DAYS)   EKG 12-Lead   ECHOCARDIOGRAM COMPLETE   Meds ordered this encounter  Medications   metoprolol succinate (TOPROL XL) 25 MG 24 hr tablet    Sig: Take 25 mg (one tablet) in the morning and 12.5 mg (half a tablet) at night.    Dispense:  180 tablet    Refill:  3    Patient Instructions  Medication Instructions:  Your physician has recommended you make the following change in your medication:  START: Metoprolol succinate 25 mg in the morning and 12.5 mg at night *If you need a refill on your cardiac medications before your next appointment, please call your pharmacy*   Lab Work: Your physician recommends that you return for lab work in:  START: TSH+T3+T4 If you have labs (blood work) drawn today and your tests are completely normal, you will receive your results only by: [place] (if you have MyChart) OR A paper copy in the mail If you have any lab test that is abnormal or we need to change your treatment, we will call you to review the results.   Testing/Procedures: Your physician has requested that you have an echocardiogram. Echocardiography is a  painless test that uses sound waves to create images of your heart. It provides your doctor with information about the size and shape of your heart and how well your heart's chambers and valves are working. This procedure takes approximately one hour. There are no restrictions for this procedure.  ZIO XT- Long Term Monitor Instructions  Your physician has requested you wear a ZIO patch monitor for 14 days.  This is a single patch monitor. [employer] supplies one patch monitor per enrollment. Additional stickers are not available. Please do not apply patch if you will be having a Nuclear Stress Test,  Echocardiogram, Cardiac CT, MRI, or Chest Xray during the period you would be wearing the  monitor. The patch cannot be worn during these tests. You cannot remove and re-apply the  ZIO XT patch monitor.  Your ZIO patch monitor will be mailed 3 day USPS to your address on file. It may take 3-5 days  to receive your monitor after you have been enrolled.  Once you have received your monitor, please review the enclosed instructions. Your monitor  has already been registered assigning a specific monitor serial # to you.  Billing and Patient Assistance Program Information  We have supplied [employer] with any of your insurance information on file for billing purposes. [employer] offers a sliding scale Patient Assistance Program for patients that do not have  insurance, or whose insurance does not completely cover the cost of the ZIO monitor.  You must apply for the Patient Assistance Program to qualify for this discounted rate.  To apply, please call [employer] at [phone], select option 4, select option 2, ask to apply for  Patient Assistance Program. Theodore Demark will ask your household income, and how many people  are in your household. They will quote your out-of-pocket cost based on that information.  [employer] will also be able to set up a 38-month, interest-free payment plan if needed.  Applying the  monitor   Shave hair from upper left chest.  Hold abrader disc by orange tab. Rub abrader in 40 strokes over the upper left chest as  indicated in your monitor instructions.  Clean area with 4 enclosed alcohol pads. Let dry.  Apply patch as indicated in monitor instructions. Patch will be placed under collarbone  on left  side of chest with arrow pointing upward.  Rub patch adhesive wings for 2 minutes. Remove white label marked "1". Remove the white  label marked "2". Rub patch adhesive wings for 2 additional minutes.  While looking in a mirror, press and release button in center of patch. A small green light will  flash 3-4 times. This will be your only indicator that the monitor has been turned on.  Do not shower for the first 24 hours. You may shower after the first 24 hours.  Press the button if you feel a symptom. You will hear a small click. Record Date, Time and  Symptom in the Patient Logbook.  When you are ready to remove the patch, follow instructions on the last 2 pages of Patient  Logbook. Stick patch monitor onto the last page of Patient Logbook.  Place Patient Logbook in the blue and white box. Use locking tab on box and tape box closed  securely. The blue and white box has prepaid postage on it. Please place it in the mailbox as  soon as possible. Your physician should have your test results approximately 7 days after the  monitor has been mailed back to [facility].  Call [facility] at [phone] if you have questions regarding  your ZIO XT patch monitor. Call them immediately if you see an orange light blinking on your  monitor.  If your monitor falls off in less than 4 days, contact our Monitor department at [phone].  If your monitor becomes loose or falls off after 4 days call [employer] at [phone] for  suggestions on securing your monitor    Follow-Up: At [facility], you and your health needs are our priority.  As part of our  continuing mission to provide you with exceptional heart care, we have created designated Provider Care Teams.  These Care Teams include your primary Cardiologist (physician) and Advanced Practice Providers (APPs -  Physician Assistants and Nurse Practitioners) who all work together to provide you with the care you need, when you need it.  We recommend signing up for the patient portal called "MyChart".  Sign up information is provided on this After Visit Summary.  MyChart is used to connect with patients for Virtual Visits (Telemedicine).  Patients are able to view lab/test results, encounter notes, upcoming appointments, etc.  Non-urgent messages can be sent to your provider as well.   To learn more about what you can do with MyChart, go to [email or web address].    Your next appointment:   3 month(s)  The format for your next appointment:   In Person  Provider:   Berniece Salines, DO    Other Instructions     Adopting a Healthy Lifestyle.  Know what a healthy weight is for you (roughly BMI <25) and aim to maintain this   Aim for 7+ servings of fruits and vegetables daily   65-80+ fluid ounces of water or unsweet tea for healthy kidneys   Limit to max 1 drink of alcohol per day; avoid smoking/tobacco   Limit animal fats in diet for cholesterol and heart health - choose grass fed whenever available   Avoid highly processed foods, and foods high in saturated/trans fats   Aim for low stress - take time to unwind and care for your mental health   Aim for 150 min of moderate intensity exercise weekly for heart health, and weights twice weekly for bone health   Aim for 7-9 hours of sleep daily  When it comes to diets, agreement about the perfect plan isnt easy to find, even among the experts. Experts at the [place] developed an idea known as the Healthy Eating Plate. Just imagine a plate divided into logical, healthy portions.   The emphasis is on diet  quality:   Load up on vegetables and fruits - one-half of your plate: Aim for color and variety, and remember that potatoes dont count.   Go for whole grains - one-quarter of your plate: Whole wheat, barley, wheat berries, quinoa, oats, brown rice, and foods made with them. If you want pasta, go with whole wheat pasta.   Protein power - one-quarter of your plate: Fish, chicken, beans, and nuts are all healthy, versatile protein sources. Limit red meat.   The diet, however, does go beyond the plate, offering a few other suggestions.   Use healthy plant oils, such as olive, canola, soy, corn, sunflower and peanut. Check the labels, and avoid partially hydrogenated oil, which have unhealthy trans fats.   If youre thirsty, drink water. Coffee and tea are good in moderation, but skip sugary drinks and limit milk and dairy products to one or two daily servings.   The type of carbohydrate in the diet is more important than the amount. Some sources of carbohydrates, such as vegetables, fruits, whole grains, and beans-are healthier than others.   Finally, stay active  Signed, Berniece Salines, DO  03/30/2021 9:41 PM    [facility]

## 2021-03-31 LAB — TSH+T4F+T3FREE
Free T4: 1.37 ng/dL (ref 0.82–1.77)
T3, Free: 2.8 pg/mL (ref 2.0–4.4)
TSH: 2.04 u[IU]/mL (ref 0.450–4.500)

## 2021-04-02 DIAGNOSIS — I480 Paroxysmal atrial fibrillation: Secondary | ICD-10-CM

## 2021-04-19 ENCOUNTER — Other Ambulatory Visit: Payer: Self-pay

## 2021-04-19 ENCOUNTER — Ambulatory Visit (HOSPITAL_COMMUNITY): Payer: Medicare Other | Attending: Cardiology

## 2021-04-19 DIAGNOSIS — I480 Paroxysmal atrial fibrillation: Secondary | ICD-10-CM | POA: Diagnosis not present

## 2021-04-19 LAB — ECHOCARDIOGRAM COMPLETE
Area-P 1/2: 3.74 cm2
S' Lateral: 3.2 cm

## 2021-04-21 ENCOUNTER — Telehealth: Payer: Self-pay | Admitting: Cardiology

## 2021-04-21 DIAGNOSIS — I10 Essential (primary) hypertension: Secondary | ICD-10-CM | POA: Diagnosis not present

## 2021-04-21 DIAGNOSIS — I480 Paroxysmal atrial fibrillation: Secondary | ICD-10-CM | POA: Diagnosis not present

## 2021-04-21 DIAGNOSIS — R42 Dizziness and giddiness: Secondary | ICD-10-CM | POA: Diagnosis not present

## 2021-04-21 NOTE — Telephone Encounter (Signed)
Returned the call to the patient. She stated that while she was driving this morning she felt dizzy and felt like she might pass out. She pulled into the local fire station. EMS took her blood pressure which was 150/110 and heart rate was 90. EKG showed atrial fibrillation.  She stated that she feels fine now and is asymptomatic. She has been staying hydrated. She checks her blood pressure daily and stated that is runs systolic 099-068 and diastolic 93-40. She did not have any specific readings to give.   She has been advised to also call her PCP since she stated that she has had vertigo in the past and felt like she has fluid in her ears currently.   She stated that she has been stressed due to the loss of her brother last month as well.   She has been advised to let someone to drive her for now in case the feeling of syncope returns.

## 2021-04-21 NOTE — Telephone Encounter (Signed)
STAT if patient feels like he/she is going to faint   Are you dizzy now? Not at this time, had a dizzy spell this morning when driving- went to the fire station after sh waited in her car for awhile  Do you feel faint or have you passed out? She felt like she was going to pass out when this haoppen  Do you have any other symptoms? No other symptoms  Have you checked your HR and BP (record if available)? 150/110 pulse 90 and irregular when it was taken at the fire station  later at home it was 139/91

## 2021-04-22 ENCOUNTER — Encounter: Payer: Self-pay | Admitting: Cardiology

## 2021-04-22 NOTE — Telephone Encounter (Signed)
Spoke with patient, see chart.    

## 2021-04-27 DIAGNOSIS — K5900 Constipation, unspecified: Secondary | ICD-10-CM | POA: Diagnosis not present

## 2021-04-27 DIAGNOSIS — H9203 Otalgia, bilateral: Secondary | ICD-10-CM | POA: Diagnosis not present

## 2021-04-27 DIAGNOSIS — I482 Chronic atrial fibrillation, unspecified: Secondary | ICD-10-CM | POA: Diagnosis not present

## 2021-04-27 DIAGNOSIS — I480 Paroxysmal atrial fibrillation: Secondary | ICD-10-CM | POA: Diagnosis not present

## 2021-04-27 DIAGNOSIS — F411 Generalized anxiety disorder: Secondary | ICD-10-CM | POA: Diagnosis not present

## 2021-04-27 DIAGNOSIS — I517 Cardiomegaly: Secondary | ICD-10-CM | POA: Diagnosis not present

## 2021-04-27 DIAGNOSIS — E559 Vitamin D deficiency, unspecified: Secondary | ICD-10-CM | POA: Diagnosis not present

## 2021-04-27 DIAGNOSIS — I10 Essential (primary) hypertension: Secondary | ICD-10-CM | POA: Diagnosis not present

## 2021-04-27 DIAGNOSIS — G629 Polyneuropathy, unspecified: Secondary | ICD-10-CM | POA: Diagnosis not present

## 2021-04-27 DIAGNOSIS — Z23 Encounter for immunization: Secondary | ICD-10-CM | POA: Diagnosis not present

## 2021-04-27 DIAGNOSIS — R7301 Impaired fasting glucose: Secondary | ICD-10-CM | POA: Diagnosis not present

## 2021-04-28 ENCOUNTER — Telehealth: Payer: Self-pay | Admitting: Cardiology

## 2021-04-28 NOTE — Telephone Encounter (Signed)
    Pt is returning call to get heart monitor result 

## 2021-04-28 NOTE — Telephone Encounter (Signed)
Spoke with pt regarding monitor results. Results given with Dr. Terrial Rhodes recommendations. Pt to keep her appointment on 1/23 to further discuss. Pt verbalizes understanding.

## 2021-05-31 ENCOUNTER — Other Ambulatory Visit: Payer: Self-pay

## 2021-05-31 ENCOUNTER — Encounter: Payer: Self-pay | Admitting: Cardiology

## 2021-05-31 ENCOUNTER — Ambulatory Visit (INDEPENDENT_AMBULATORY_CARE_PROVIDER_SITE_OTHER): Payer: Medicare Other | Admitting: Cardiology

## 2021-05-31 VITALS — BP 124/80 | HR 83 | Ht 66.0 in | Wt 201.4 lb

## 2021-05-31 DIAGNOSIS — R079 Chest pain, unspecified: Secondary | ICD-10-CM

## 2021-05-31 DIAGNOSIS — I4819 Other persistent atrial fibrillation: Secondary | ICD-10-CM

## 2021-05-31 DIAGNOSIS — I340 Nonrheumatic mitral (valve) insufficiency: Secondary | ICD-10-CM | POA: Diagnosis not present

## 2021-05-31 DIAGNOSIS — I361 Nonrheumatic tricuspid (valve) insufficiency: Secondary | ICD-10-CM | POA: Diagnosis not present

## 2021-05-31 DIAGNOSIS — R5383 Other fatigue: Secondary | ICD-10-CM

## 2021-05-31 DIAGNOSIS — R0609 Other forms of dyspnea: Secondary | ICD-10-CM | POA: Insufficient documentation

## 2021-05-31 MED ORDER — METOPROLOL SUCCINATE ER 25 MG PO TB24: 25.0000 mg | ORAL_TABLET | Freq: Two times a day (BID) | ORAL | 3 refills | Status: DC

## 2021-05-31 NOTE — Progress Notes (Signed)
Cardiology Office Note:    Date:  05/31/2021   ID:  Bethany Frederick, DOB December 01, 1950, MRN [id number]  PCP:  [name], MD  Cardiologist:  Berniece Salines, DO  Electrophysiologist:  None   Referring MD: [name], MD   " I am really tired and sometimes short of breath"  History of Present Illness:    Bethany Frederick is a 71 y.o. female with a hx of diagnosed paroxysmal atrial fibrillation on metoprolol succinate and Eliquis, diabetes mellitus, presents today to establish general cardiology care.  The patient tells me that she was diagnosed with atrial fibrillation few weeks ago at which time she was seen in our A. fib clinic she was started on Eliquis after completing 3 weeks of this she was scheduled for cardioversion.   I saw the patient on March 30, 2021 at that time she was still in atrial fibrillation.  Unfortunately she had stopped her Eliquis therefore cannot pursue rhythm control strategy.  I educated the patient about staying on the Eliquis.  We will place to monitor the patient in the meantime monitor showed continuous persistent atrial fibrillation.  I also repeated the echo.  She notes that she has been experiencing significant fatigue as well as some shortness of breath.  Recently had to increase her beta-blocker and she is not taking her Toprol-XL 25 mg twice a day.   Past Medical History:  Diagnosis Date   Arthritis    "mainly in my back, feet, hips" (03/09/2017)   Cancer of sigmoid colon ([place]) 2017   Cataract, immature    Diabetes mellitus ([place])    GERD (gastroesophageal reflux disease)    Heart palpitations    History of blood transfusion 2002   "related to endometrosis"   Sclerosing adenosis of left breast 02/2015   Seasonal allergies    Wears partial dentures    upper front - 1 tooth    Past Surgical History:  Procedure Laterality Date   APPENDECTOMY     BREAST BIOPSY Left 2016   BREAST LUMPECTOMY Left 03/2015   BREAST LUMPECTOMY WITH RADIOACTIVE  SEED LOCALIZATION Left 03/13/2015   Procedure: BREAST LUMPECTOMY WITH RADIOACTIVE SEED LOCALIZATION;  Surgeon: Autumn Messing III, MD;  Location: [place];  Service: General;  Laterality: Left;   CARDIOVERSION N/A 03/18/2021   Procedure: CARDIOVERSION;  Surgeon: Lelon Perla, MD;  Location: [place];  Service: Cardiovascular;  Laterality: N/A;   COLECTOMY     COLONOSCOPY     ENDOMETRIAL FULGURATION  06/13/2000   HERNIA REPAIR     INGUINAL HERNIA REPAIR  03/05/2012   Procedure: HERNIA REPAIR INGUINAL INCARCERATED;  Surgeon: Ralene Ok, MD;  Location: [place];  Service: General;  Laterality: left;   INGUINAL HERNIA REPAIR Left    INSERTION OF MESH  03/05/2012   Procedure: INSERTION OF MESH;  Surgeon: Ralene Ok, MD;  Location: [place];  Service: General;  Laterality: Left;   INSERTION OF MESH N/A 03/09/2017   Procedure: INSERTION OF MESH;  Surgeon: Jovita Kussmaul, MD;  Location: [place];  Service: General;  Laterality: N/A;   LAPAROSCOPIC ASSISTED VENTRAL HERNIA REPAIR  03/09/2017   w/mesh   LAPAROSCOPIC SIGMOID COLECTOMY Bilateral 01/28/2016   Procedure: LAPAROSCOPIC ASSISTED SIGMOID COLECTOMY;  Surgeon: Autumn Messing III, MD;  Location: [place];  Service: General;  Laterality: Bilateral;   MYOMECTOMY  06/13/2000   PORT-A-CATH REMOVAL Right 03/09/2017   PORT-A-CATH REMOVAL N/A 03/09/2017   Procedure: REMOVAL PORT-A-CATH RIGHT CHEST;  Surgeon: Jovita Kussmaul, MD;  [facility];  Service: General;  Laterality: N/A;   PORTACATH PLACEMENT N/A 02/18/2016   Procedure: INSERTION PORT-A-CATH;  Surgeon: Autumn Messing III, MD;  Location: [place];  Service: General;  Laterality: N/A;   SHOULDER ARTHROSCOPY W/ ROTATOR CUFF REPAIR Left 2013   VENTRAL HERNIA REPAIR N/A 03/09/2017   Procedure: LAPAROSCOPIC VENTRAL HERNIA REPAIR;  Surgeon: Autumn Messing III, MD;  Location: [place];  Service: General;  Laterality: N/A;    Current Medications: Current Meds  Medication Sig    acetaminophen (TYLENOL) 500 MG tablet Take 500 mg by mouth every 6 (six) hours as needed (pain).   clobetasol cream (TEMOVATE) 0.76 % Apply 1 application topically 2 (two) times daily as needed (skin irritation/rash).   cycloSPORINE (RESTASIS) 0.05 % ophthalmic emulsion Place 1 drop into both eyes 2 (two) times daily.   famotidine (PEPCID) 20 MG tablet Take 20 mg by mouth daily as needed for heartburn or indigestion.    fluocinonide (LIDEX) 0.05 % external solution Apply 1 application topically daily as needed (dermatitis/skin irritation.).   metFORMIN (GLUCOPHAGE-XR) 500 MG 24 hr tablet Take 500 mg by mouth 2 (two) times daily.   Multiple Vitamins-Minerals (PRESERVISION AREDS 2+MULTI VIT PO) Take 1 tablet by mouth 3 (three) times a week.   Polyethyl Glycol-Propyl Glycol (SYSTANE OP) Apply 1 drop to eye 2 (two) times daily.   triamcinolone cream (KENALOG) 0.1 % Apply 1 application topically 2 (two) times daily as needed (skin irritation/rash).   Vitamin D, Ergocalciferol, (DRISDOL) 50000 units CAPS capsule Take 50,000 Units by mouth once a week.   [DISCONTINUED] metoprolol succinate (TOPROL XL) 25 MG 24 hr tablet Take 25 mg (one tablet) in the morning and 12.5 mg (half a tablet) at night.     Allergies:   Patient has no known allergies.   Social History   Socioeconomic History   Marital status: Married    Spouse name: Not on file   Number of children: Not on file   Years of education: Not on file   Highest education level: Not on file  Occupational History   Not on file  Tobacco Use   Smoking status: Never   Smokeless tobacco: Never  Vaping Use   Vaping Use: Never used  Substance and Sexual Activity   Alcohol use: Not Currently    Alcohol/week: 7.0 standard drinks    Types: 7 Glasses of wine per week   Drug use: No   Sexual activity: Not Currently  Other Topics Concern   Not on file  Social History Narrative   Not on file   Social Determinants of Health   Financial Resource  Strain: Not on file  Food Insecurity: Not on file  Transportation Needs: Not on file  Physical Activity: Not on file  Stress: Not on file  Social Connections: Not on file     Family History: The patient's family history includes Cancer (age of onset: 37) in her paternal aunt; Cancer (age of onset: 71) in her cousin; Cancer (age of onset: 24) in her brother.  ROS:   Review of Systems  Constitution: Negative for decreased appetite, fever and weight gain.  HENT: Negative for congestion, ear discharge, hoarse voice and sore throat.   Eyes: Negative for discharge, redness, vision loss in right eye and visual halos.  Cardiovascular: Negative for chest pain, dyspnea on exertion, leg swelling, orthopnea and palpitations.  Respiratory: Negative for cough, hemoptysis, shortness of breath and snoring.  Endocrine: Negative for heat intolerance and polyphagia.  Hematologic/Lymphatic: Negative for bleeding problem. Does not bruise/bleed easily.  Skin: Negative for flushing, nail changes, rash and suspicious lesions.  Musculoskeletal: Negative for arthritis, joint pain, muscle cramps, myalgias, neck pain and stiffness.  Gastrointestinal: Negative for abdominal pain, bowel incontinence, diarrhea and excessive appetite.  Genitourinary: Negative for decreased libido, genital sores and incomplete emptying.  Neurological: Negative for brief paralysis, focal weakness, headaches and loss of balance.  Psychiatric/Behavioral: Negative for altered mental status, depression and suicidal ideas.  Allergic/Immunologic: Negative for HIV exposure and persistent infections.    EKGs/Labs/Other Studies Reviewed:    The following studies were reviewed today:   EKG:  Tnone today   TTE 04/2021 IMPRESSIONS   1. Left ventricular ejection fraction, by estimation, is 45 to 50%. The  left ventricle has mildly decreased function. The left ventricle  demonstrates global hypokinesis. Diastolic function indeterminant due  to AFib.   2. Right ventricular systolic function is normal. The right ventricular  size is normal.   3. Left atrial size was mildly dilated.   4. The mitral valve is degenerative. Mild mitral valve regurgitation.   5. Tricuspid valve regurgitation is mild to moderate.   6. The aortic valve is tricuspid. There is mild calcification of the  aortic valve. There is mild thickening of the aortic valve. Aortic valve  regurgitation is not visualized. Aortic valve sclerosis/calcification is  present, without any evidence of  aortic stenosis.   Comparison(s): Compared to prior TTE in 2019, the patient is now in AFib  and EF appears mildly reduced at 45-50% (previously 60-65%).   FINDINGS   Left Ventricle: Left ventricular ejection fraction, by estimation, is 45  to 50%. The left ventricle has mildly decreased function. The left  ventricle demonstrates global hypokinesis. The left ventricular internal  cavity size was normal in size. There is   borderline concentric left ventricular hypertrophy. Diastolic function  indeterminant due to AFib.   Right Ventricle: The right ventricular size is normal. Right vetricular  wall thickness was not well visualized. Right ventricular systolic  function is normal.   Left Atrium: Left atrial size was mildly dilated.   Right Atrium: Right atrial size was normal in size.   Pericardium: There is no evidence of pericardial effusion.   Mitral Valve: The mitral valve is degenerative in appearance. There is  mild thickening of the mitral valve leaflet(s). There is mild  calcification of the mitral valve leaflet(s). Mild to moderate mitral  annular calcification. Mild mitral valve  regurgitation.   Tricuspid Valve: The tricuspid valve is normal in structure. Tricuspid valve regurgitation is mild to moderate.   Aortic Valve: The aortic valve is tricuspid. There is mild calcification of the aortic valve. There is mild thickening of the aortic valve. Aortic  valve regurgitation is not visualized. Aortic valve sclerosis/calcification is present, without any evidence of aortic stenosis.   Pulmonic Valve: The pulmonic valve was normal in structure. Pulmonic valve regurgitation is trivial.   Aorta: The aortic root and ascending aorta are structurally normal, with no evidence of dilitation.   IAS/Shunts: No atrial level shunt detected by color flow Doppler.   Echo 2019 Study Conclusions   - Left ventricle: The cavity size was normal. Systolic function was    normal. The estimated ejection fraction was in the range of 60%    to 65%. Wall motion was normal; there were no regional wall    motion abnormalities. Doppler parameters are consistent with  abnormal left ventricular relaxation (grade 1 diastolic    dysfunction).  - Mitral valve: Calcified annulus. Mildly thickened leaflets .  - Left atrium: The atrium was mildly dilated. Volume/bsa, S: 36.3    ml/m^2.  - Tricuspid valve: There was mild regurgitation.  - Pulmonary arteries: Systolic pressure was mildly increased. PA    peak pressure: 38 mm Hg (S).   -------------------------------------------------------------------  Study data:  No prior study was available for comparison.  Study  status:  Routine.  Procedure:  The patient reported no pain pre or  post test. Transthoracic echocardiography. Image quality was  adequate.  Study completion:  There were no complications.  Transthoracic echocardiography.  M-mode, complete 2D, spectral  Doppler, and color Doppler.  Birthdate:  Patient birthdate:  09/01/1950.  Age:  Patient is 71 yr old.  Sex:  Gender: female.  BMI: 31.3 kg/m^2.  Blood pressure:     131/81  Patient status:  Outpatient.  Study date:  Study date: 06/09/2017. Study time: 11:36  AM.  Location:  [facility] [facility]   -------------------------------------------------------------------   -------------------------------------------------------------------  Left ventricle:  The  cavity size was normal. Systolic function was  normal. The estimated ejection fraction was in the range of 60% to  65%. Wall motion was normal; there were no regional wall motion  abnormalities. Doppler parameters are consistent with abnormal left  ventricular relaxation (grade 1 diastolic dysfunction).   -------------------------------------------------------------------  Aortic valve:   Trileaflet; normal thickness leaflets. Mobility was  not restricted.  Doppler:  Transvalvular velocity was within the  normal range. There was no stenosis. There was no regurgitation.     -------------------------------------------------------------------  Aorta:  Aortic root: The aortic root was normal in size.   -------------------------------------------------------------------  Mitral valve:   Calcified annulus. Mildly thickened leaflets .  Mobility was not restricted.  Doppler:  Transvalvular velocity was  within the normal range. There was no evidence for stenosis. There  was trivial regurgitation.    Peak gradient (D): 2 mm Hg.   -------------------------------------------------------------------  Left atrium:  The atrium was mildly dilated.   -------------------------------------------------------------------  Right ventricle:  The cavity size was normal. Wall thickness was  normal. Systolic function was normal.   -------------------------------------------------------------------  Pulmonic valve:    Structurally normal valve.   Cusp separation was  normal.  Doppler:  Transvalvular velocity was within the normal  range. There was no evidence for stenosis. There was trivial  regurgitation.   -------------------------------------------------------------------  Tricuspid valve:   Structurally normal valve.    Doppler:  Transvalvular velocity was within the normal range. There was mild  regurgitation.   -------------------------------------------------------------------  Pulmonary artery:    The main pulmonary artery was normal-sized.  Systolic pressure was mildly increased.   -------------------------------------------------------------------  Right atrium:  The atrium was normal in size.   -------------------------------------------------------------------  Pericardium:  There was no pericardial effusion.   -------------------------------------------------------------------  Systemic veins:  Inferior vena cava: The vessel was normal in size.   -------------------------------------------------------------------  Recent Labs: 02/23/2021: ALT 22; Magnesium 1.9 03/18/2021: BUN 15; Creatinine, Ser 0.69; Hemoglobin 14.7; Platelets 203; Potassium 4.8; Sodium 139 03/30/2021: TSH 2.040  Recent Lipid Panel No results found for: CHOL, TRIG, HDL, CHOLHDL, VLDL, LDLCALC, LDLDIRECT  Physical Exam:    VS:  BP 124/80    Pulse 83    Ht 5\' 6"  (1.676 m)    Wt 201 lb 6.4 oz (91.4 kg)    SpO2 98%    BMI 32.51 kg/m     Wt Readings  from Last 3 Encounters:  05/31/21 201 lb 6.4 oz (91.4 kg)  03/30/21 201 lb 3.2 oz (91.3 kg)  03/18/21 200 lb (90.7 kg)     GEN: Well nourished, well developed in no acute distress HEENT: Normal NECK: No JVD; No carotid bruits LYMPHATICS: No lymphadenopathy CARDIAC: S1S2 noted,RRR, no murmurs, rubs, gallops RESPIRATORY:  Clear to auscultation without rales, wheezing or rhonchi  ABDOMEN: Soft, non-tender, non-distended, +bowel sounds, no guarding. EXTREMITIES: No edema, No cyanosis, no clubbing MUSCULOSKELETAL:  No deformity  SKIN: Warm and dry NEUROLOGIC:  Alert and oriented x 3, non-focal PSYCHIATRIC:  Normal affect, good insight  ASSESSMENT:    1. Persistent atrial fibrillation ([place])   2. Nonrheumatic tricuspid valve regurgitation   3. Nonrheumatic mitral valve regurgitation   4. Chest pain, unspecified type   5. Other fatigue   6. DOE (dyspnea on exertion)    PLAN:     We discussed her testing results which showed persistent atrial  fibrillation.  She is symptomatic and failed cardioversion.  Like to refer the patient to EP for consideration for rhythm control strategy given her symptoms.  She is agreeable for this. In the meantime she will continue her beta-blocker as well as her Eliquis 5 mg twice daily. Her echo showed mild depressed ejection fraction we will keep her on a beta-blocker.  In regards to her  fatigue and shortness of breath we will get a Lexiscan to rule out any ischemic etiology given her risk factors.    The patient is in agreement with the above plan. The patient left the office in stable condition.  The patient will follow up in 6 months or sooner if needed.   Medication Adjustments/Labs and Tests Ordered: Current medicines are reviewed at length with the patient today.  Concerns regarding medicines are outlined above.  Orders Placed This Encounter  Procedures   Ambulatory referral to Cardiac Electrophysiology   MYOCARDIAL PERFUSION IMAGING   Meds ordered this encounter  Medications   metoprolol succinate (TOPROL XL) 25 MG 24 hr tablet    Sig: Take 1 tablet (25 mg total) by mouth in the morning and at bedtime.    Dispense:  180 tablet    Refill:  3    Patient Instructions  Medication Instructions:  Your physician recommends that you continue on your current medications as directed. Please refer to the Current Medication list given to you today.  *If you need a refill on your cardiac medications before your next appointment, please call your pharmacy*   Lab Work: None If you have labs (blood work) drawn today and your tests are completely normal, you will receive your results only by: [place] (if you have MyChart) OR A paper copy in the mail If you have any lab test that is abnormal or we need to change your treatment, we will call you to review the results.   Testing/Procedures: Your physician has requested that you have a lexiscan myoview. For further information please visit  [email or web address]. Please follow instruction sheet, as given.   The test will take approximately 3 to 4 hours to complete; you may bring reading material.  If someone comes with you to your appointment, they will need to remain in the main lobby due to limited space in the testing area. **If you are pregnant or breastfeeding, please notify the nuclear lab prior to your appointment**  How to prepare for your Myocardial Perfusion Test: Do not eat or drink 3 hours prior to your test, except  you may have water. Do not consume products containing caffeine (regular or decaffeinated) 12 hours prior to your test. (ex: coffee, chocolate, sodas, tea). Do bring a list of your current medications with you.  If not listed below, you may take your medications as normal. Do wear comfortable clothes (no dresses or overalls) and walking shoes, tennis shoes preferred (No heels or open toe shoes are allowed). Do NOT wear cologne, perfume, aftershave, or lotions (deodorant is allowed). If these instructions are not followed, your test will have to be rescheduled.    Follow-Up: At [facility], you and your health needs are our priority.  As part of our continuing mission to provide you with exceptional heart care, we have created designated Provider Care Teams.  These Care Teams include your primary Cardiologist (physician) and Advanced Practice Providers (APPs -  Physician Assistants and Nurse Practitioners) who all work together to provide you with the care you need, when you need it.  We recommend signing up for the patient portal called "MyChart".  Sign up information is provided on this After Visit Summary.  MyChart is used to connect with patients for Virtual Visits (Telemedicine).  Patients are able to view lab/test results, encounter notes, upcoming appointments, etc.  Non-urgent messages can be sent to your provider as well.   To learn more about what you can do with MyChart, go to  [email or web address].    Your next appointment:   6 month(s)  The format for your next appointment:   In Person  Provider:   Berniece Salines, DO     Other Instructions   Adopting a Healthy Lifestyle.  Know what a healthy weight is for you (roughly BMI <25) and aim to maintain this   Aim for 7+ servings of fruits and vegetables daily   65-80+ fluid ounces of water or unsweet tea for healthy kidneys   Limit to max 1 drink of alcohol per day; avoid smoking/tobacco   Limit animal fats in diet for cholesterol and heart health - choose grass fed whenever available   Avoid highly processed foods, and foods high in saturated/trans fats   Aim for low stress - take time to unwind and care for your mental health   Aim for 150 min of moderate intensity exercise weekly for heart health, and weights twice weekly for bone health   Aim for 7-9 hours of sleep daily   When it comes to diets, agreement about the perfect plan isnt easy to find, even among the experts. Experts at the [place] developed an idea known as the Healthy Eating Plate. Just imagine a plate divided into logical, healthy portions.   The emphasis is on diet quality:   Load up on vegetables and fruits - one-half of your plate: Aim for color and variety, and remember that potatoes dont count.   Go for whole grains - one-quarter of your plate: Whole wheat, barley, wheat berries, quinoa, oats, brown rice, and foods made with them. If you want pasta, go with whole wheat pasta.   Protein power - one-quarter of your plate: Fish, chicken, beans, and nuts are all healthy, versatile protein sources. Limit red meat.   The diet, however, does go beyond the plate, offering a few other suggestions.   Use healthy plant oils, such as olive, canola, soy, corn, sunflower and peanut. Check the labels, and avoid partially hydrogenated oil, which have unhealthy trans fats.   If youre thirsty, drink water. Coffee  and tea are  good in moderation, but skip sugary drinks and limit milk and dairy products to one or two daily servings.   The type of carbohydrate in the diet is more important than the amount. Some sources of carbohydrates, such as vegetables, fruits, whole grains, and beans-are healthier than others.   Finally, stay active  Signed, Berniece Salines, DO  05/31/2021 12:07 PM    [place]

## 2021-05-31 NOTE — Patient Instructions (Signed)
Medication Instructions:  Your physician recommends that you continue on your current medications as directed. Please refer to the Current Medication list given to you today.  *If you need a refill on your cardiac medications before your next appointment, please call your pharmacy*   Lab Work: None If you have labs (blood work) drawn today and your tests are completely normal, you will receive your results only by: [place] (if you have MyChart) OR A paper copy in the mail If you have any lab test that is abnormal or we need to change your treatment, we will call you to review the results.   Testing/Procedures: Your physician has requested that you have a lexiscan myoview. For further information please visit [email or web address]. Please follow instruction sheet, as given.   The test will take approximately 3 to 4 hours to complete; you may bring reading material.  If someone comes with you to your appointment, they will need to remain in the main lobby due to limited space in the testing area. **If you are pregnant or breastfeeding, please notify the nuclear lab prior to your appointment**  How to prepare for your Myocardial Perfusion Test: Do not eat or drink 3 hours prior to your test, except you may have water. Do not consume products containing caffeine (regular or decaffeinated) 12 hours prior to your test. (ex: coffee, chocolate, sodas, tea). Do bring a list of your current medications with you.  If not listed below, you may take your medications as normal. Do wear comfortable clothes (no dresses or overalls) and walking shoes, tennis shoes preferred (No heels or open toe shoes are allowed). Do NOT wear cologne, perfume, aftershave, or lotions (deodorant is allowed). If these instructions are not followed, your test will have to be rescheduled.    Follow-Up: At [facility], you and your health needs are our priority.  As part of our continuing mission to provide you with  exceptional heart care, we have created designated Provider Care Teams.  These Care Teams include your primary Cardiologist (physician) and Advanced Practice Providers (APPs -  Physician Assistants and Nurse Practitioners) who all work together to provide you with the care you need, when you need it.  We recommend signing up for the patient portal called "MyChart".  Sign up information is provided on this After Visit Summary.  MyChart is used to connect with patients for Virtual Visits (Telemedicine).  Patients are able to view lab/test results, encounter notes, upcoming appointments, etc.  Non-urgent messages can be sent to your provider as well.   To learn more about what you can do with MyChart, go to [email or web address].    Your next appointment:   6 month(s)  The format for your next appointment:   In Person  Provider:   Berniece Salines, DO     Other Instructions

## 2021-06-04 ENCOUNTER — Telehealth (HOSPITAL_COMMUNITY): Payer: Self-pay | Admitting: *Deleted

## 2021-06-04 NOTE — Telephone Encounter (Signed)
Close encounter 

## 2021-06-08 ENCOUNTER — Ambulatory Visit (HOSPITAL_COMMUNITY)
Admission: RE | Admit: 2021-06-08 | Discharge: 2021-06-08 | Disposition: A | Discharge status: Home / Self Care | Payer: Medicare Other | Source: Ambulatory Visit | Attending: Cardiovascular Disease | Admitting: Cardiovascular Disease

## 2021-06-08 ENCOUNTER — Other Ambulatory Visit: Payer: Self-pay

## 2021-06-08 DIAGNOSIS — R079 Chest pain, unspecified: Secondary | ICD-10-CM | POA: Insufficient documentation

## 2021-06-08 DIAGNOSIS — R0609 Other forms of dyspnea: Secondary | ICD-10-CM | POA: Diagnosis not present

## 2021-06-08 LAB — MYOCARDIAL PERFUSION IMAGING
Base ST Depression (mm): 0.5 mm
Peak HR: 144 {beats}/min
Rest HR: 99 {beats}/min
Rest Nuclear Isotope Dose: 9.6 mCi
SDS: 2
SRS: 0
SSS: 2
ST Depression (mm): 1 mm
Stress Nuclear Isotope Dose: 29.4 mCi
TID: 1.03

## 2021-06-08 MED ORDER — TECHNETIUM TC 99M TETROFOSMIN IV KIT
29.4000 | PACK | Freq: Once | INTRAVENOUS | Status: AC | PRN
  Administered 2021-06-08: 29.4 via INTRAVENOUS
  Filled 2021-06-08: qty 30

## 2021-06-08 MED ORDER — REGADENOSON 0.4 MG/5ML IV SOLN
0.4000 mg | Freq: Once | INTRAVENOUS | Status: AC
  Administered 2021-06-08: 0.4 mg via INTRAVENOUS

## 2021-06-08 MED ORDER — TECHNETIUM TC 99M TETROFOSMIN IV KIT
9.6000 | PACK | Freq: Once | INTRAVENOUS | Status: AC | PRN
  Administered 2021-06-08: 9.6 via INTRAVENOUS
  Filled 2021-06-08: qty 10

## 2021-06-16 ENCOUNTER — Other Ambulatory Visit: Payer: Self-pay | Admitting: Obstetrics & Gynecology

## 2021-06-16 DIAGNOSIS — R928 Other abnormal and inconclusive findings on diagnostic imaging of breast: Secondary | ICD-10-CM

## 2021-06-30 ENCOUNTER — Ambulatory Visit: Payer: Medicare Other | Admitting: Cardiology

## 2021-07-02 ENCOUNTER — Ambulatory Visit
Admission: RE | Admit: 2021-07-02 | Discharge: 2021-07-02 | Disposition: A | Discharge status: Home / Self Care | Payer: Medicare Other | Source: Ambulatory Visit | Attending: Obstetrics & Gynecology | Admitting: Obstetrics & Gynecology

## 2021-07-02 ENCOUNTER — Ambulatory Visit: Payer: Medicare Other

## 2021-07-02 ENCOUNTER — Other Ambulatory Visit: Payer: Self-pay

## 2021-07-02 DIAGNOSIS — R928 Other abnormal and inconclusive findings on diagnostic imaging of breast: Secondary | ICD-10-CM

## 2021-07-08 ENCOUNTER — Other Ambulatory Visit (HOSPITAL_COMMUNITY): Payer: Self-pay | Admitting: Physician Assistant

## 2021-07-18 NOTE — Progress Notes (Deleted)
Saw tobb on 1/23 ?Persistent AF on eliquis ?Symptomatic with fatigue and SOB ?Cardioverted 11/10 but had recurrent AF by appt on 03/30/2021 ? ? ?PVI + posterior wall ? ?

## 2021-07-20 ENCOUNTER — Other Ambulatory Visit: Payer: Self-pay

## 2021-07-20 ENCOUNTER — Encounter: Payer: Self-pay | Admitting: Cardiology

## 2021-07-20 ENCOUNTER — Ambulatory Visit (INDEPENDENT_AMBULATORY_CARE_PROVIDER_SITE_OTHER): Payer: Medicare Other | Admitting: Cardiology

## 2021-07-20 VITALS — BP 120/64 | HR 92 | Ht 66.0 in | Wt 199.2 lb

## 2021-07-20 DIAGNOSIS — I4819 Other persistent atrial fibrillation: Secondary | ICD-10-CM

## 2021-07-20 DIAGNOSIS — R0609 Other forms of dyspnea: Secondary | ICD-10-CM

## 2021-07-20 NOTE — Progress Notes (Signed)
?Electrophysiology Office Note:   ? ?Date:  07/20/2021  ? ?ID:  Bethany Frederick, DOB Jul 03, 1950, MRN [id number] ? ?PCP:  [name], MD  ?[facility] Cardiologist:  Berniece Salines, DO  ?[place] Electrophysiologist:  None  ? ?Referring MD: Berniece Salines, DO  ? ?Chief Complaint: Consult for ablation ? ?History of Present Illness:   ? ?Bethany Frederick is a 71 y.o. female who presents for an evaluation of ablation at the request of Dr. Harriet Masson. Their medical history includes paroxysmal atrial fibrillation, diabetes mellitus, arthritis, GERD, and cancer of sigmoid colon. ? ?She saw Dr. Harriet Masson on 05/31/2021. She reported significant fatigue and shortness of breath. Testing showed persistent atrial fibrillation. Given her symptoms and previous failed ablation she was referred to EP for consideration of rhythm control strategies. She was continued on Eliquis 5 mg twice daily, as well as her beta-blocker.  ? ?Overall, she felt better after her cardioversion 03/18/2021. Since then she reverted to atrial fibrillation.  ? ?Generally, she is unable to tell when she is in atrial fibrillation. Once in a while she feels a "flutter", but this is very rare since being on medical therapy. A year ago, her flutters were mostly noticeable at night when she was getting ready to sleep. They are much improved at this time. ? ?She becomes fatigued with long walks or working out in the yard. She is unsure if this is worse when in atrial fibrillation. ? ?Lately she has been under a lot of stress. Unfortunately she lost several family members recently. ? ?She denies any chest pain, or peripheral edema. No lightheadedness, headaches, syncope, snoring, orthopnea, or PND. ? ?Of note, she was infected with COVID in July 2022. ? ? ?  ?Past Medical History:  ?Diagnosis Date  ? Arthritis   ? "mainly in my back, feet, hips" (03/09/2017)  ? Cancer of sigmoid colon ([place]) 2017  ? Cataract, immature   ? Diabetes mellitus ([place])   ? GERD (gastroesophageal  reflux disease)   ? Heart palpitations   ? History of blood transfusion 2002  ? "related to endometrosis"  ? 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 Seasonal allergies   ? Wears partial dentures   ? upper front - 1 tooth  ? ? ?Past Surgical History:  ?Procedure Laterality Date  ? APPENDECTOMY    ? BREAST BIOPSY Left 2016  ? BREAST LUMPECTOMY Left 03/2015  ? BREAST LUMPECTOMY WITH RADIOACTIVE SEED LOCALIZATION Left 03/13/2015  ? Procedure: BREAST LUMPECTOMY WITH RADIOACTIVE SEED LOCALIZATION;  Surgeon: Autumn Messing III, MD;  Location: [place];  Service: General;  Laterality: Left;  ? CARDIOVERSION N/A 03/18/2021  ? Procedure: CARDIOVERSION;  Surgeon: Lelon Perla, MD;  Location: [facility];  Service: Cardiovascular;  Laterality: N/A;  ? COLECTOMY    ? COLONOSCOPY    ? ENDOMETRIAL FULGURATION  06/13/2000  ? HERNIA REPAIR    ? INGUINAL HERNIA REPAIR  03/05/2012  ? Procedure: HERNIA REPAIR INGUINAL INCARCERATED;  Surgeon: Ralene Ok, MD;  Location: [place];  Service: General;  Laterality: left;  ? INGUINAL HERNIA REPAIR Left   ? INSERTION OF MESH  03/05/2012  ? Procedure: INSERTION OF MESH;  Surgeon: Ralene Ok, MD;  Location: [place];  Service: General;  Laterality: Left;  ? INSERTION OF MESH N/A 03/09/2017  ? Procedure: INSERTION OF MESH;  Surgeon: Jovita Kussmaul, MD;  Location: [place];  Service: General;  Laterality: N/A;  ? LAPAROSCOPIC ASSISTED VENTRAL HERNIA REPAIR  03/09/2017  ?  w/mesh  ? LAPAROSCOPIC SIGMOID COLECTOMY Bilateral 01/28/2016  ? Procedure: LAPAROSCOPIC ASSISTED SIGMOID COLECTOMY;  Surgeon: Autumn Messing III, MD;  Location: [place];  Service: General;  Laterality: Bilateral;  ? MYOMECTOMY  06/13/2000  ? PORT-A-CATH REMOVAL Right 03/09/2017  ? PORT-A-CATH REMOVAL N/A 03/09/2017  ? Procedure: REMOVAL PORT-A-CATH RIGHT CHEST;  Surgeon: Jovita Kussmaul, MD;  Location: [place];  Service: General;  Laterality: N/A;  ? PORTACATH PLACEMENT N/A 02/18/2016  ? Procedure: INSERTION PORT-A-CATH;   Surgeon: Autumn Messing III, MD;  Location: [place];  Service: General;  Laterality: N/A;  ? SHOULDER ARTHROSCOPY W/ ROTATOR CUFF REPAIR Left 2013  ? VENTRAL HERNIA REPAIR N/A 03/09/2017  ? Procedure: LAPAROSCOPIC VENTRAL HERNIA REPAIR;  Surgeon: Jovita Kussmaul, MD;  Location: [place];  Service: General;  Laterality: N/A;  ? ? ?Current Medications: ?Current Meds  ?Medication Sig  ? acetaminophen (TYLENOL) 500 MG tablet Take 500 mg by mouth every 6 (six) hours as needed (pain).  ? clobetasol cream (TEMOVATE) 2.70 % Apply 1 application topically 2 (two) times daily as needed (skin irritation/rash).  ? clobetasol ointment (TEMOVATE) 0.05 % SMARTSIG:sparingly Topical Twice Daily  ? cycloSPORINE (RESTASIS) 0.05 % ophthalmic emulsion Place 1 drop into both eyes 2 (two) times daily.  ? ELIQUIS 5 MG TABS tablet TAKE 1 TABLET BY MOUTH TWICE A DAY  ? famotidine (PEPCID) 20 MG tablet Take 20 mg by mouth daily as needed for heartburn or indigestion.   ? fluocinonide (LIDEX) 0.05 % external solution Apply 1 application topically daily as needed (dermatitis/skin irritation.).  ? metFORMIN (GLUCOPHAGE-XR) 500 MG 24 hr tablet Take 500 mg by mouth 2 (two) times daily.  ? metoprolol succinate (TOPROL XL) 25 MG 24 hr tablet Take 1 tablet (25 mg total) by mouth in the morning and at bedtime.  ? Multiple Vitamins-Minerals (PRESERVISION AREDS 2+MULTI VIT PO) Take 1 tablet by mouth 3 (three) times a week.  ? Polyethyl Glycol-Propyl Glycol (SYSTANE OP) Apply 1 drop to eye 2 (two) times daily.  ? triamcinolone cream (KENALOG) 0.1 % Apply 1 application topically 2 (two) times daily as needed (skin irritation/rash).  ? Vitamin D, Ergocalciferol, (DRISDOL) 50000 units CAPS capsule Take 50,000 Units by mouth once a week.  ?  ? ?Allergies:   Patient has no known allergies.  ? ?Social History  ? ?Socioeconomic History  ? Marital status: Married  ?  Spouse name: Not on file  ? Number of children: Not on file  ? Years of education: Not on  file  ? Highest education level: Not on file  ?Occupational History  ? Not on file  ?Tobacco Use  ? Smoking status: Never  ? Smokeless tobacco: Never  ?Vaping Use  ? Vaping Use: Never used  ?Substance and Sexual Activity  ? Alcohol use: Not Currently  ?  Alcohol/week: 7.0 standard drinks  ?  Types: 7 Glasses of wine per week  ? Drug use: No  ? Sexual activity: Not Currently  ?Other Topics Concern  ? Not on file  ?Social History Narrative  ? Not on file  ? ?Social Determinants of Health  ? ?Financial Resource Strain: Not on file  ?Food Insecurity: Not on file  ?Transportation Needs: Not on file  ?Physical Activity: Not on file  ?Stress: Not on file  ?Social Connections: Not on file  ?  ? ?Family History: ?The patient's family history includes Cancer (age of onset: 75) in her paternal aunt; Cancer (age of onset: 28) in her  cousin; Cancer (age of onset: 29) in her brother. ? ?ROS:   ?Please see the history of present illness.    ?(+) Stress ?(+) Fatigue ?(+) Palpitations (Rare) ?All other systems reviewed and are negative. ? ?EKGs/Labs/Other Studies Reviewed:   ? ?The following studies were reviewed today: ? ?Lexiscan Myoview 06/08/2021: ?  The study is normal. The study is low risk. ?  1.0 mm of down sloping ST depression was noted. ?  LV perfusion is normal. ?  End diastolic cavity size is normal. ?  Prior study not available for comparison. ?  Not gated due to atrial fibrillation. ?  ?Low risk stress nuclear study with normal perfusion. Not gated due to atrial fibrillation.  ? ?Monitor 04/2021: ?Patch Wear Time:  13 days and 12 hours (2022-11-25T20:17:31-0500 to 2022-12-09T08:29:57-0500) ?  ?Atrial Fibrillation occurred continuously (100% burden), ranging from 46-189 bpm (avg of 100 bpm). Isolated VEs were rare (<1.0%, 1255), VE Couplets were rare (<1.0%, 1), and VE Triplets were rare (<1.0%, 1).  ?  ?Conclusion: Persistent atrial fibrillation. ? ?Echo 04/19/2021: ? 1. Left ventricular ejection fraction, by  estimation, is 45 to 50%. The  ?left ventricle has mildly decreased function. The left ventricle  ?demonstrates global hypokinesis. Diastolic function indeterminant due to AFib.  ? 2. Right ventricular systoli

## 2021-07-20 NOTE — Patient Instructions (Signed)
Medication Instructions:  ?Your physician recommends that you continue on your current medications as directed. Please refer to the Current Medication list given to you today. ? ?*If you need a refill on your cardiac medications before your next appointment, please call your pharmacy* ? ? ?Lab Work: ?None ordered. ? ?If you have labs (blood work) drawn today and your tests are completely normal, you will receive your results only by: ?MyChart Message (if you have MyChart) OR ?A paper copy in the mail ?If you have any lab test that is abnormal or we need to change your treatment, we will call you to review the results. ? ? ?Testing/Procedures: ?None ordered. ? ? ? ?Follow-Up: ?At [facility], you and your health needs are our priority.  As part of our continuing mission to provide you with exceptional heart care, we have created designated Provider Care Teams.  These Care Teams include your primary Cardiologist (physician) and Advanced Practice Providers (APPs -  Physician Assistants and Nurse Practitioners) who all work together to provide you with the care you need, when you need it. ? ?We recommend signing up for the patient portal called "MyChart".  Sign up information is provided on this After Visit Summary.  MyChart is used to connect with patients for Virtual Visits (Telemedicine).  Patients are able to view lab/test results, encounter notes, upcoming appointments, etc.  Non-urgent messages can be sent to your provider as well.   ?To learn more about what you can do with MyChart, go to [email or web address].   ? ?Your next appointment:   ?To be scheduled - Please let us know once you decide how you would like to proceed with the Afib Ablation. ?

## 2021-11-16 ENCOUNTER — Encounter: Payer: Self-pay | Admitting: Cardiology

## 2021-11-22 ENCOUNTER — Other Ambulatory Visit (HOSPITAL_COMMUNITY): Payer: Self-pay | Admitting: *Deleted

## 2021-11-22 MED ORDER — APIXABAN 5 MG PO TABS: 5.0000 mg | ORAL_TABLET | Freq: Two times a day (BID) | ORAL | 6 refills | Status: DC

## 2021-12-08 ENCOUNTER — Ambulatory Visit: Payer: Medicare Other | Admitting: Cardiology

## 2021-12-13 ENCOUNTER — Ambulatory Visit (INDEPENDENT_AMBULATORY_CARE_PROVIDER_SITE_OTHER): Payer: Medicare Other | Admitting: Cardiology

## 2021-12-13 ENCOUNTER — Encounter: Payer: Self-pay | Admitting: Cardiology

## 2021-12-13 VITALS — BP 124/80 | HR 87 | Ht 67.0 in | Wt 193.4 lb

## 2021-12-13 DIAGNOSIS — I10 Essential (primary) hypertension: Secondary | ICD-10-CM | POA: Insufficient documentation

## 2021-12-13 DIAGNOSIS — I361 Nonrheumatic tricuspid (valve) insufficiency: Secondary | ICD-10-CM | POA: Diagnosis not present

## 2021-12-13 DIAGNOSIS — I4819 Other persistent atrial fibrillation: Secondary | ICD-10-CM

## 2021-12-13 DIAGNOSIS — I340 Nonrheumatic mitral (valve) insufficiency: Secondary | ICD-10-CM | POA: Diagnosis not present

## 2021-12-13 NOTE — Patient Instructions (Addendum)
Medication Instructions:  Your physician recommends that you continue on your current medications as directed. Please refer to the Current Medication list given to you today.  *If you need a refill on your cardiac medications before your next appointment, please call your pharmacy*   Lab Work: None If you have labs (blood work) drawn today and your tests are completely normal, you will receive your results only by: [place] (if you have MyChart) OR A paper copy in the mail If you have any lab test that is abnormal or we need to change your treatment, we will call you to review the results.   Testing/Procedures: None   Follow-Up: At [facility], you and your health needs are our priority.  As part of our continuing mission to provide you with exceptional heart care, we have created designated Provider Care Teams.  These Care Teams include your primary Cardiologist (physician) and Advanced Practice Providers (APPs -  Physician Assistants and Nurse Practitioners) who all work together to provide you with the care you need, when you need it.  We recommend signing up for the patient portal called "MyChart".  Sign up information is provided on this After Visit Summary.  MyChart is used to connect with patients for Virtual Visits (Telemedicine).  Patients are able to view lab/test results, encounter notes, upcoming appointments, etc.  Non-urgent messages can be sent to your provider as well.   To learn more about what you can do with MyChart, go to [email or web address].    Your next appointment:   6 month(s)  The format for your next appointment:   In Person  Provider:   Berniece Salines, DO

## 2021-12-13 NOTE — Progress Notes (Signed)
Cardiology Office Note:    Date:  12/13/2021   ID:  Bethany Frederick, DOB [birth year], MRN [id number]  PCP:  [name], MD  Cardiologist:  Berniece Salines, DO  Electrophysiologist:  None   Referring MD: [name], MD   " I am doing   History of Present Illness:    Bethany Frederick is a 71 y.o. female with a hx of diagnosed paroxysmal atrial fibrillation on metoprolol succinate and Eliquis, diabetes mellitus, presents today to establish general cardiology care.  The patient tells me that she was diagnosed with atrial fibrillation few weeks ago at which time she was seen in our A. fib clinic she was started on Eliquis after completing 3 weeks of this she was scheduled for cardioversion.   I saw the patient on March 30, 2021 at that time she was still in atrial fibrillation.  Unfortunately she had stopped her Eliquis therefore cannot pursue rhythm control strategy.  I educated the patient about staying on the Eliquis.  We will place to monitor the patient in the meantime monitor showed continuous persistent atrial fibrillation.  I also repeated the echo.   Since I saw the patient she has follow-up with our EP partners.  During that discussion talked about ways for rhythm control.  She had agreed for ablation but has been having some questions if she really wants to move forward with this.  She reported that she has been experiencing some leg swelling.     Past Medical History:  Diagnosis Date   Arthritis    "mainly in my back, feet, hips" (03/09/2017)   Cancer of sigmoid colon ([place]) 2017   Cataract, immature    Diabetes mellitus ([place])    GERD (gastroesophageal reflux disease)    Heart palpitations    History of blood transfusion 2002   "related to endometrosis"   Sclerosing adenosis of left breast 02/2015   Seasonal allergies    Wears partial dentures    upper front - 1 tooth    Past Surgical History:  Procedure Laterality Date   APPENDECTOMY     BREAST BIOPSY Left 2016    BREAST LUMPECTOMY Left 03/2015   BREAST LUMPECTOMY WITH RADIOACTIVE SEED LOCALIZATION Left 03/13/2015   Procedure: BREAST LUMPECTOMY WITH RADIOACTIVE SEED LOCALIZATION;  Surgeon: Autumn Messing III, MD;  Location: [place];  Service: General;  Laterality: Left;   CARDIOVERSION N/A 03/18/2021   Procedure: CARDIOVERSION;  Surgeon: Lelon Perla, MD;  Location: [place];  Service: Cardiovascular;  Laterality: N/A;   COLECTOMY     COLONOSCOPY     ENDOMETRIAL FULGURATION  06/13/2000   HERNIA REPAIR     INGUINAL HERNIA REPAIR  03/05/2012   Procedure: HERNIA REPAIR INGUINAL INCARCERATED;  Surgeon: Ralene Ok, MD;  Location: [place];  Service: General;  Laterality: left;   INGUINAL HERNIA REPAIR Left    INSERTION OF MESH  03/05/2012   Procedure: INSERTION OF MESH;  Surgeon: Ralene Ok, MD;  Location: [place];  Service: General;  Laterality: Left;   INSERTION OF MESH N/A 03/09/2017   Procedure: INSERTION OF MESH;  Surgeon: Jovita Kussmaul, MD;  Location: [place];  Service: General;  Laterality: N/A;   LAPAROSCOPIC ASSISTED VENTRAL HERNIA REPAIR  03/09/2017   w/mesh   LAPAROSCOPIC SIGMOID COLECTOMY Bilateral 01/28/2016   Procedure: LAPAROSCOPIC ASSISTED SIGMOID COLECTOMY;  Surgeon: Autumn Messing III, MD;  Location: [place];  Service: General;  Laterality: Bilateral;   MYOMECTOMY  06/13/2000   PORT-A-CATH  REMOVAL Right 03/09/2017   PORT-A-CATH REMOVAL N/A 03/09/2017   Procedure: REMOVAL PORT-A-CATH RIGHT CHEST;  Surgeon: Jovita Kussmaul, MD;  Location: [place];  Service: General;  Laterality: N/A;   PORTACATH PLACEMENT N/A 02/18/2016   Procedure: INSERTION PORT-A-CATH;  Surgeon: Autumn Messing III, MD;  Location: [place];  Service: General;  Laterality: N/A;   SHOULDER ARTHROSCOPY W/ ROTATOR CUFF REPAIR Left 2013   VENTRAL HERNIA REPAIR N/A 03/09/2017   Procedure: LAPAROSCOPIC VENTRAL HERNIA REPAIR;  Surgeon: Autumn Messing III, MD;  Location: [place];  Service: General;  Laterality:  N/A;    Current Medications: Current Meds  Medication Sig   acetaminophen (TYLENOL) 500 MG tablet Take 500 mg by mouth every 6 (six) hours as needed (pain).   apixaban (ELIQUIS) 5 MG TABS tablet Take 1 tablet (5 mg total) by mouth 2 (two) times daily.   clobetasol cream (TEMOVATE) 5.03 % Apply 1 application topically 2 (two) times daily as needed (skin irritation/rash).   cycloSPORINE (RESTASIS) 0.05 % ophthalmic emulsion Place 1 drop into both eyes 2 (two) times daily.   famotidine (PEPCID) 20 MG tablet Take 20 mg by mouth daily as needed for heartburn or indigestion.    fluocinonide (LIDEX) 0.05 % external solution Apply 1 application topically daily as needed (dermatitis/skin irritation.).   metFORMIN (GLUCOPHAGE-XR) 500 MG 24 hr tablet Take 500 mg by mouth 2 (two) times daily.   metoprolol succinate (TOPROL XL) 25 MG 24 hr tablet Take 1 tablet (25 mg total) by mouth in the morning and at bedtime. (Patient taking differently: Take 50 mg by mouth in the morning and at bedtime. 2 tablets in the morning, 2 Tablets in the evening.)   Multiple Vitamins-Minerals (PRESERVISION AREDS 2+MULTI VIT PO) Take 1 tablet by mouth 3 (three) times a week.   Polyethyl Glycol-Propyl Glycol (SYSTANE OP) Apply 1 drop to eye as needed.   triamcinolone cream (KENALOG) 0.1 % Apply 1 application topically 2 (two) times daily as needed (skin irritation/rash).   Vitamin D, Ergocalciferol, (DRISDOL) 50000 units CAPS capsule Take 50,000 Units by mouth once a week.     Allergies:   Patient has no known allergies.   Social History   Socioeconomic History   Marital status: Married    Spouse name: Not on file   Number of children: Not on file   Years of education: Not on file   Highest education level: Not on file  Occupational History   Not on file  Tobacco Use   Smoking status: Never   Smokeless tobacco: Never  Vaping Use   Vaping Use: Never used  Substance and Sexual Activity   Alcohol use: Not Currently     Alcohol/week: 7.0 standard drinks of alcohol    Types: 7 Glasses of wine per week   Drug use: No   Sexual activity: Not Currently  Other Topics Concern   Not on file  Social History Narrative   Not on file   Social Determinants of Health   Financial Resource Strain: Not on file  Food Insecurity: Not on file  Transportation Needs: Not on file  Physical Activity: Not on file  Stress: Not on file  Social Connections: Not on file     Family History: The patient's family history includes Cancer (age of onset: 43) in her paternal aunt; Cancer (age of onset: 60) in her cousin; Cancer (age of onset: 30) in her brother.  ROS:   Review of Systems  Constitution: Negative for decreased  appetite, fever and weight gain.  HENT: Negative for congestion, ear discharge, hoarse voice and sore throat.   Eyes: Negative for discharge, redness, vision loss in right eye and visual halos.  Cardiovascular: Negative for chest pain, dyspnea on exertion, leg swelling, orthopnea and palpitations.  Respiratory: Negative for cough, hemoptysis, shortness of breath and snoring.   Endocrine: Negative for heat intolerance and polyphagia.  Hematologic/Lymphatic: Negative for bleeding problem. Does not bruise/bleed easily.  Skin: Negative for flushing, nail changes, rash and suspicious lesions.  Musculoskeletal: Negative for arthritis, joint pain, muscle cramps, myalgias, neck pain and stiffness.  Gastrointestinal: Negative for abdominal pain, bowel incontinence, diarrhea and excessive appetite.  Genitourinary: Negative for decreased libido, genital sores and incomplete emptying.  Neurological: Negative for brief paralysis, focal weakness, headaches and loss of balance.  Psychiatric/Behavioral: Negative for altered mental status, depression and suicidal ideas.  Allergic/Immunologic: Negative for HIV exposure and persistent infections.    EKGs/Labs/Other Studies Reviewed:    The following studies were  reviewed today:   EKG: None today  Nuclear stress test January 2023 The study is normal. The study is low risk.   1.0 mm of down sloping ST depression was noted.   LV perfusion is normal.   End diastolic cavity size is normal.   Prior study not available for comparison.   Not gated due to atrial fibrillation.   Low risk stress nuclear study with normal perfusion. Not gated due to atrial fibrillation.     ZIO monitor December 2022 Patch Wear Time:  13 days and 12 hours (2022-11-25T20:17:31-0500 to 2022-12-09T08:29:57-0500)   Atrial Fibrillation occurred continuously (100% burden), ranging from 46-189 bpm (avg of 100 bpm). Isolated VEs were rare (<1.0%, 1255), VE Couplets were rare (<1.0%, 1), and VE Triplets were rare (<1.0%, 1).    Conclusion: Persistent atrial fibrillation.  Transthoracic echocardiogram April 19, 2021 IMPRESSIONS     1. Left ventricular ejection fraction, by estimation, is 45 to 50%. The  left ventricle has mildly decreased function. The left ventricle  demonstrates global hypokinesis. Diastolic function indeterminant due to  AFib.   2. Right ventricular systolic function is normal. The right ventricular  size is normal.   3. Left atrial size was mildly dilated.   4. The mitral valve is degenerative. Mild mitral valve regurgitation.   5. Tricuspid valve regurgitation is mild to moderate.   6. The aortic valve is tricuspid. There is mild calcification of the  aortic valve. There is mild thickening of the aortic valve. Aortic valve  regurgitation is not visualized. Aortic valve sclerosis/calcification is  present, without any evidence of  aortic stenosis.   Comparison(s): Compared to prior TTE in 2019, the patient is now in AFib  and EF appears mildly reduced at 45-50% (previously 60-65%).   FINDINGS   Left Ventricle: Left ventricular ejection fraction, by estimation, is 45  to 50%. The left ventricle has mildly decreased function. The left   ventricle demonstrates global hypokinesis. The left ventricular internal  cavity size was normal in size. There is   borderline concentric left ventricular hypertrophy. Diastolic function  indeterminant due to AFib.   Right Ventricle: The right ventricular size is normal. Right vetricular  wall thickness was not well visualized. Right ventricular systolic  function is normal.   Left Atrium: Left atrial size was mildly dilated.   Right Atrium: Right atrial size was normal in size.   Pericardium: There is no evidence of pericardial effusion.   Mitral Valve: The mitral valve is degenerative in  appearance. There is  mild thickening of the mitral valve leaflet(s). There is mild  calcification of the mitral valve leaflet(s). Mild to moderate mitral  annular calcification. Mild mitral valve  regurgitation.   Tricuspid Valve: The tricuspid valve is normal in structure. Tricuspid  valve regurgitation is mild to moderate.   Aortic Valve: The aortic valve is tricuspid. There is mild calcification  of the aortic valve. There is mild thickening of the aortic valve. Aortic  valve regurgitation is not visualized. Aortic valve  sclerosis/calcification is present, without any  evidence of aortic stenosis.   Pulmonic Valve: The pulmonic valve was normal in structure. Pulmonic valve  regurgitation is trivial.   Aorta: The aortic root and ascending aorta are structurally normal, with  no evidence of dilitation.   IAS/Shunts: No atrial level shunt detected by color flow Doppler.       Recent Labs: 02/23/2021: ALT 22; Magnesium 1.9 03/18/2021: BUN 15; Creatinine, Ser 0.69; Hemoglobin 14.7; Platelets 203; Potassium 4.8; Sodium 139 03/30/2021: TSH 2.040  Recent Lipid Panel No results found for: "CHOL", "TRIG", "HDL", "CHOLHDL", "VLDL", "LDLCALC", "LDLDIRECT"  Physical Exam:    VS:  BP 124/80   Pulse 87   Ht '5\' 7"'$  (1.702 m)   Wt 193 lb 6.4 oz (87.7 kg)   SpO2 98%   BMI 30.29 kg/m      Wt Readings from Last 3 Encounters:  12/13/21 193 lb 6.4 oz (87.7 kg)  07/20/21 199 lb 3.2 oz (90.4 kg)  06/08/21 201 lb (91.2 kg)     GEN: Well nourished, well developed in no acute distress HEENT: Normal NECK: No JVD; No carotid bruits LYMPHATICS: No lymphadenopathy CARDIAC: S1S2 noted,RRR, no murmurs, rubs, gallops RESPIRATORY:  Clear to auscultation without rales, wheezing or rhonchi  ABDOMEN: Soft, non-tender, non-distended, +bowel sounds, no guarding. EXTREMITIES: Trace nonpitting edema, No cyanosis, no clubbing MUSCULOSKELETAL:  No deformity  SKIN: Warm and dry NEUROLOGIC:  Alert and oriented x 3, non-focal PSYCHIATRIC:  Normal affect, good insight  ASSESSMENT:    1. Persistent atrial fibrillation ([place])   2. Nonrheumatic tricuspid valve regurgitation   3. Nonrheumatic mitral valve regurgitation   4. Primary hypertension    PLAN:    Clinically she appears to be doing well from a cardiovascular standpoint.  There have been no medication changes.  Her metoprolol had prior been increased by her PCP she is taking 50 mg of metoprolol succinate twice a day.  Will keep her on this.  Continue her Eliquis. She still has not made a decision on whether she wants to go forward with the ablation or not.  She is deciding on this.  She had a few questions today I was able to answer for her.  Blood pressure is acceptable, continue with current antihypertensive regimen.  The patient is in agreement with the above plan. The patient left the office in stable condition.  The patient will follow up in 6 months and sooner if needed.   Medication Adjustments/Labs and Tests Ordered: Current medicines are reviewed at length with the patient today.  Concerns regarding medicines are outlined above.  No orders of the defined types were placed in this encounter.  No orders of the defined types were placed in this encounter.   Patient Instructions  Medication Instructions:  Your physician  recommends that you continue on your current medications as directed. Please refer to the Current Medication list given to you today.  *If you need a refill on your cardiac medications before your  next appointment, please call your pharmacy*   Lab Work: None If you have labs (blood work) drawn today and your tests are completely normal, you will receive your results only by: [place] (if you have MyChart) OR A paper copy in the mail If you have any lab test that is abnormal or we need to change your treatment, we will call you to review the results.   Testing/Procedures: None   Follow-Up: At [facility], you and your health needs are our priority.  As part of our continuing mission to provide you with exceptional heart care, we have created designated Provider Care Teams.  These Care Teams include your primary Cardiologist (physician) and Advanced Practice Providers (APPs -  Physician Assistants and Nurse Practitioners) who all work together to provide you with the care you need, when you need it.  We recommend signing up for the patient portal called "MyChart".  Sign up information is provided on this After Visit Summary.  MyChart is used to connect with patients for Virtual Visits (Telemedicine).  Patients are able to view lab/test results, encounter notes, upcoming appointments, etc.  Non-urgent messages can be sent to your provider as well.   To learn more about what you can do with MyChart, go to [email or web address].    Your next appointment:   6 month(s)  The format for your next appointment:   In Person  Provider:   Berniece Salines, DO       Adopting a Healthy Lifestyle.  Know what a healthy weight is for you (roughly BMI <25) and aim to maintain this   Aim for 7+ servings of fruits and vegetables daily   65-80+ fluid ounces of water or unsweet tea for healthy kidneys   Limit to max 1 drink of alcohol per day; avoid smoking/tobacco   Limit animal fats in  diet for cholesterol and heart health - choose grass fed whenever available   Avoid highly processed foods, and foods high in saturated/trans fats   Aim for low stress - take time to unwind and care for your mental health   Aim for 150 min of moderate intensity exercise weekly for heart health, and weights twice weekly for bone health   Aim for 7-9 hours of sleep daily   When it comes to diets, agreement about the perfect plan isnt easy to find, even among the experts. Experts at the [place] developed an idea known as the Healthy Eating Plate. Just imagine a plate divided into logical, healthy portions.   The emphasis is on diet quality:   Load up on vegetables and fruits - one-half of your plate: Aim for color and variety, and remember that potatoes dont count.   Go for whole grains - one-quarter of your plate: Whole wheat, barley, wheat berries, quinoa, oats, brown rice, and foods made with them. If you want pasta, go with whole wheat pasta.   Protein power - one-quarter of your plate: Fish, chicken, beans, and nuts are all healthy, versatile protein sources. Limit red meat.   The diet, however, does go beyond the plate, offering a few other suggestions.   Use healthy plant oils, such as olive, canola, soy, corn, sunflower and peanut. Check the labels, and avoid partially hydrogenated oil, which have unhealthy trans fats.   If youre thirsty, drink water. Coffee and tea are good in moderation, but skip sugary drinks and limit milk and dairy products to one or two daily servings.   The type  of carbohydrate in the diet is more important than the amount. Some sources of carbohydrates, such as vegetables, fruits, whole grains, and beans-are healthier than others.   Finally, stay active  Signed, Berniece Salines, DO  12/13/2021 8:38 AM    [place]

## 2022-05-21 ENCOUNTER — Encounter: Payer: Self-pay | Admitting: Hematology

## 2022-06-13 ENCOUNTER — Encounter: Payer: Self-pay | Admitting: Hematology

## 2022-06-15 ENCOUNTER — Other Ambulatory Visit (HOSPITAL_COMMUNITY): Payer: Self-pay | Admitting: Physician Assistant

## 2022-06-15 NOTE — Telephone Encounter (Signed)
Prescription refill request for Eliquis received. Indication: AF Last office visit:  12/13/21  K Tobb DO Scr: 0.69 on 03/18/21  [facility]  Has appt with Dr Harriet Masson on 06/20/22 Age: 73 Weight: 87.7kg  Based on above findings Eliquis '5mg'$  twice daily is the appropriate dose.  Requested CBC/BMP at appt with Dr Harriet Masson on 04/20/23.  Refill approved x 1.

## 2022-06-20 ENCOUNTER — Ambulatory Visit: Payer: Medicare Other | Attending: Cardiology | Admitting: Cardiology

## 2022-06-20 ENCOUNTER — Encounter: Payer: Self-pay | Admitting: Cardiology

## 2022-06-20 VITALS — BP 124/82 | HR 95 | Ht 66.0 in | Wt 191.0 lb

## 2022-06-20 DIAGNOSIS — I361 Nonrheumatic tricuspid (valve) insufficiency: Secondary | ICD-10-CM

## 2022-06-20 DIAGNOSIS — I4819 Other persistent atrial fibrillation: Secondary | ICD-10-CM

## 2022-06-20 DIAGNOSIS — I340 Nonrheumatic mitral (valve) insufficiency: Secondary | ICD-10-CM | POA: Diagnosis not present

## 2022-06-20 DIAGNOSIS — I10 Essential (primary) hypertension: Secondary | ICD-10-CM | POA: Diagnosis not present

## 2022-06-20 MED ORDER — METOPROLOL SUCCINATE ER 50 MG PO TB24: 50.0000 mg | ORAL_TABLET | Freq: Two times a day (BID) | ORAL | 3 refills | Status: DC

## 2022-06-20 MED ORDER — METOPROLOL SUCCINATE ER 50 MG PO TB24
50.0000 mg | ORAL_TABLET | Freq: Every day | ORAL | 3 refills | Status: DC
Start: 2022-06-20 — End: 2022-06-20

## 2022-06-20 NOTE — Progress Notes (Unsigned)
Cardiology Office Note:    Date:  06/23/2022   ID:  Bethany Frederick, DOB 1951-04-22, MRN [id number]  PCP:  [name], MD  Cardiologist:  Berniece Salines, DO  Electrophysiologist:  None   Referring MD: [name], MD   " I am doing   History of Present Illness:    Bethany Frederick is a 72 y.o. female with a hx of diagnosed paroxysmal atrial fibrillation on metoprolol succinate and Eliquis, diabetes mellitus, presents today to establish general cardiology care.  The patient tells me that she was diagnosed with atrial fibrillation few weeks ago at which time she was seen in our A. fib clinic she was started on Eliquis after completing 3 weeks of this she was scheduled for cardioversion.   I saw the patient on March 30, 2021 at that time she was still in atrial fibrillation.  Unfortunately she had stopped her Eliquis therefore cannot pursue rhythm control strategy.  I educated the patient about staying on the Eliquis.  We will place to monitor the patient in the meantime monitor showed continuous persistent atrial fibrillation.  I also repeated the echo.   At her visit on December 13, 2021 she was doing well from a cardiovascular standpoint.  During that visit there were no changes on her medication.  Will continue her metoprolol as well as Eliquis.  Since I saw the patient she is doing well.  Has not had any hospitalization.  No other complaints at this time.   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   Seasonal allergies    Wears partial dentures    upper front - 1 tooth    Past Surgical History:  Procedure Laterality Date   APPENDECTOMY     BREAST BIOPSY Left  2016   BREAST LUMPECTOMY Left 03/2015   BREAST LUMPECTOMY WITH RADIOACTIVE SEED LOCALIZATION Left 03/13/2015   Procedure: BREAST LUMPECTOMY WITH RADIOACTIVE SEED LOCALIZATION;  Surgeon: Autumn Messing III, MD;  Location: [place];  Service: General;  Laterality: Left;   CARDIOVERSION N/A 03/18/2021   Procedure: CARDIOVERSION;  Surgeon: Lelon Perla, MD;  Location: [place];  Service: Cardiovascular;  Laterality: N/A;   COLECTOMY     COLONOSCOPY     ENDOMETRIAL FULGURATION  06/13/2000   HERNIA REPAIR     INGUINAL HERNIA REPAIR  03/05/2012   Procedure: HERNIA REPAIR INGUINAL INCARCERATED;  Surgeon: Ralene Ok, MD;  Location: [place];  Service: General;  Laterality: left;   INGUINAL HERNIA REPAIR Left    INSERTION OF MESH  03/05/2012   Procedure: INSERTION OF MESH;  Surgeon: Ralene Ok, MD;  Location: [place];  Service: General;  Laterality: Left;   INSERTION OF MESH N/A 03/09/2017   Procedure: INSERTION OF MESH;  Surgeon: Jovita Kussmaul, MD;  Location: [place];  Service: General;  Laterality: N/A;   LAPAROSCOPIC ASSISTED VENTRAL HERNIA REPAIR  03/09/2017   w/mesh   LAPAROSCOPIC SIGMOID COLECTOMY Bilateral 01/28/2016   Procedure: LAPAROSCOPIC ASSISTED SIGMOID COLECTOMY;  Surgeon: Autumn Messing III, MD;  Location: [place];  Service: General;  Laterality: Bilateral;   MYOMECTOMY  06/13/2000  PORT-A-CATH REMOVAL Right 03/09/2017   PORT-A-CATH REMOVAL N/A 03/09/2017   Procedure: REMOVAL PORT-A-CATH RIGHT CHEST;  Surgeon: Jovita Kussmaul, MD;  Location: [place];  Service: General;  Laterality: N/A;   PORTACATH PLACEMENT N/A 02/18/2016   Procedure: INSERTION PORT-A-CATH;  Surgeon: Autumn Messing III, MD;  Location: [place];  Service: General;  Laterality: N/A;   SHOULDER ARTHROSCOPY W/ ROTATOR CUFF REPAIR Left 2013   VENTRAL HERNIA REPAIR N/A 03/09/2017   Procedure: LAPAROSCOPIC VENTRAL HERNIA REPAIR;  Surgeon: Autumn Messing III, MD;  Location: [place];  Service: General;   Laterality: N/A;    Current Medications: Current Meds  Medication Sig   acetaminophen (TYLENOL) 500 MG tablet Take 500 mg by mouth every 6 (six) hours as needed (pain).   apixaban (ELIQUIS) 5 MG TABS tablet TAKE 1 TABLET BY MOUTH TWICE A DAY   clobetasol cream (TEMOVATE) [id number] % Apply 1 application topically 2 (two) times daily as needed (skin irritation/rash).   cycloSPORINE (RESTASIS) 0.05 % ophthalmic emulsion Place 1 drop into both eyes 2 (two) times daily.   famotidine (PEPCID) 20 MG tablet Take 20 mg by mouth daily as needed for heartburn or indigestion.    fluocinonide (LIDEX) 0.05 % external solution Apply 1 application topically daily as needed (dermatitis/skin irritation.).   metFORMIN (GLUCOPHAGE-XR) 500 MG 24 hr tablet Take 500 mg by mouth 2 (two) times daily.   Multiple Vitamins-Minerals (PRESERVISION AREDS 2+MULTI VIT PO) Take 1 tablet by mouth 3 (three) times a week.   Polyethyl Glycol-Propyl Glycol (SYSTANE OP) Apply 1 drop to eye as needed.   triamcinolone cream (KENALOG) 0.1 % Apply 1 application topically 2 (two) times daily as needed (skin irritation/rash).   Vitamin D, Ergocalciferol, (DRISDOL) 50000 units CAPS capsule Take 50,000 Units by mouth once a week.   [DISCONTINUED] metoprolol succinate (TOPROL XL) 25 MG 24 hr tablet Take 1 tablet (25 mg total) by mouth in the morning and at bedtime. (Patient taking differently: Take 50 mg by mouth in the morning and at bedtime. 2 tablets in the morning, 2 Tablets in the evening.)   [DISCONTINUED] metoprolol succinate (TOPROL-XL) 50 MG 24 hr tablet Take 1 tablet (50 mg total) by mouth daily. Take with or immediately following a meal.     Allergies:   Patient has no known allergies.   Social History   Socioeconomic History   Marital status: Married    Spouse name: Not on file   Number of children: Not on file   Years of education: Not on file   Highest education level: Not on file  Occupational History   Not on file   Tobacco Use   Smoking status: Never   Smokeless tobacco: Never  Vaping Use   Vaping Use: Never used  Substance and Sexual Activity   Alcohol use: Not Currently    Alcohol/week: 7.0 standard drinks of alcohol    Types: 7 Glasses of wine per week   Drug use: No   Sexual activity: Not Currently  Other Topics Concern   Not on file  Social History Narrative   Not on file   Social Determinants of Health   Financial Resource Strain: Not on file  Food Insecurity: Not on file  Transportation Needs: Not on file  Physical Activity: Not on file  Stress: Not on file  Social Connections: Not on file     Family History: The patient's family history includes Cancer (age of onset: 52) in her paternal aunt; Cancer (age of  onset: 65) in her cousin; Cancer (age of onset: 70) in her brother.  ROS:   Review of Systems  Constitution: Negative for decreased appetite, fever and weight gain.  HENT: Negative for congestion, ear discharge, hoarse voice and sore throat.   Eyes: Negative for discharge, redness, vision loss in right eye and visual halos.  Cardiovascular: Negative for chest pain, dyspnea on exertion, leg swelling, orthopnea and palpitations.  Respiratory: Negative for cough, hemoptysis, shortness of breath and snoring.   Endocrine: Negative for heat intolerance and polyphagia.  Hematologic/Lymphatic: Negative for bleeding problem. Does not bruise/bleed easily.  Skin: Negative for flushing, nail changes, rash and suspicious lesions.  Musculoskeletal: Negative for arthritis, joint pain, muscle cramps, myalgias, neck pain and stiffness.  Gastrointestinal: Negative for abdominal pain, bowel incontinence, diarrhea and excessive appetite.  Genitourinary: Negative for decreased libido, genital sores and incomplete emptying.  Neurological: Negative for brief paralysis, focal weakness, headaches and loss of balance.  Psychiatric/Behavioral: Negative for altered mental status, depression and  suicidal ideas.  Allergic/Immunologic: Negative for HIV exposure and persistent infections.    EKGs/Labs/Other Studies Reviewed:    The following studies were reviewed today:   EKG: None today  Nuclear stress test January 2023 The study is normal. The study is low risk.   1.0 mm of down sloping ST depression was noted.   LV perfusion is normal.   End diastolic cavity size is normal.   Prior study not available for comparison.   Not gated due to atrial fibrillation.   Low risk stress nuclear study with normal perfusion. Not gated due to atrial fibrillation.     ZIO monitor December 2022 Patch Wear Time:  13 days and 12 hours (2022-11-25T20:17:31-0500 to 2022-12-09T08:29:57-0500)   Atrial Fibrillation occurred continuously (100% burden), ranging from 46-189 bpm (avg of 100 bpm). Isolated VEs were rare (<1.0%, 1255), VE Couplets were rare (<1.0%, 1), and VE Triplets were rare (<1.0%, 1).    Conclusion: Persistent atrial fibrillation.  Transthoracic echocardiogram April 19, 2021 IMPRESSIONS     1. Left ventricular ejection fraction, by estimation, is 45 to 50%. The  left ventricle has mildly decreased function. The left ventricle  demonstrates global hypokinesis. Diastolic function indeterminant due to  AFib.   2. Right ventricular systolic function is normal. The right ventricular  size is normal.   3. Left atrial size was mildly dilated.   4. The mitral valve is degenerative. Mild mitral valve regurgitation.   5. Tricuspid valve regurgitation is mild to moderate.   6. The aortic valve is tricuspid. There is mild calcification of the  aortic valve. There is mild thickening of the aortic valve. Aortic valve  regurgitation is not visualized. Aortic valve sclerosis/calcification is  present, without any evidence of  aortic stenosis.   Comparison(s): Compared to prior TTE in 2019, the patient is now in AFib  and EF appears mildly reduced at 45-50% (previously 60-65%).    FINDINGS   Left Ventricle: Left ventricular ejection fraction, by estimation, is 45  to 50%. The left ventricle has mildly decreased function. The left  ventricle demonstrates global hypokinesis. The left ventricular internal  cavity size was normal in size. There is   borderline concentric left ventricular hypertrophy. Diastolic function  indeterminant due to AFib.   Right Ventricle: The right ventricular size is normal. Right vetricular  wall thickness was not well visualized. Right ventricular systolic  function is normal.   Left Atrium: Left atrial size was mildly dilated.   Right Atrium: Right atrial  size was normal in size.   Pericardium: There is no evidence of pericardial effusion.   Mitral Valve: The mitral valve is degenerative in appearance. There is  mild thickening of the mitral valve leaflet(s). There is mild  calcification of the mitral valve leaflet(s). Mild to moderate mitral  annular calcification. Mild mitral valve  regurgitation.   Tricuspid Valve: The tricuspid valve is normal in structure. Tricuspid  valve regurgitation is mild to moderate.   Aortic Valve: The aortic valve is tricuspid. There is mild calcification  of the aortic valve. There is mild thickening of the aortic valve. Aortic  valve regurgitation is not visualized. Aortic valve  sclerosis/calcification is present, without any  evidence of aortic stenosis.   Pulmonic Valve: The pulmonic valve was normal in structure. Pulmonic valve  regurgitation is trivial.   Aorta: The aortic root and ascending aorta are structurally normal, with  no evidence of dilitation.   IAS/Shunts: No atrial level shunt detected by color flow Doppler.       Recent Labs: No results found for requested labs within last 365 days.  Recent Lipid Panel No results found for: "CHOL", "TRIG", "HDL", "CHOLHDL", "VLDL", "LDLCALC", "LDLDIRECT"  Physical Exam:    VS:  BP 124/82   Pulse 95   Ht 5' 6"$  (1.676 m)   Wt  86.6 kg   SpO2 99%   BMI 30.83 kg/m     Wt Readings from Last 3 Encounters:  06/20/22 86.6 kg  12/13/21 87.7 kg  07/20/21 90.4 kg     GEN: Well nourished, well developed in no acute distress HEENT: Normal NECK: No JVD; No carotid bruits LYMPHATICS: No lymphadenopathy CARDIAC: S1S2 noted,RRR, no murmurs, rubs, gallops RESPIRATORY:  Clear to auscultation without rales, wheezing or rhonchi  ABDOMEN: Soft, non-tender, non-distended, +bowel sounds, no guarding. EXTREMITIES: Trace nonpitting edema, No cyanosis, no clubbing MUSCULOSKELETAL:  No deformity  SKIN: Warm and dry NEUROLOGIC:  Alert and oriented x 3, non-focal PSYCHIATRIC:  Normal affect, good insight  ASSESSMENT:    1. Persistent atrial fibrillation ([place])   2. Nonrheumatic tricuspid valve regurgitation   3. Nonrheumatic mitral valve regurgitation   4. Primary hypertension     PLAN:    Clinically she appears to be doing well from a cardiovascular standpoint.  She is on metoprolol 50 mg twice daily, continue her Eliquis.  No changes will be made today.  Blood pressure is acceptable, continue with current antihypertensive regimen.  The patient is in agreement with the above plan. The patient left the office in stable condition.  The patient will follow up in 12 months and sooner if needed.   Medication Adjustments/Labs and Tests Ordered: Current medicines are reviewed at length with the patient today.  Concerns regarding medicines are outlined above.  No orders of the defined types were placed in this encounter.  Meds ordered this encounter  Medications   DISCONTD: metoprolol succinate (TOPROL-XL) 50 MG 24 hr tablet    Sig: Take 1 tablet (50 mg total) by mouth daily. Take with or immediately following a meal.    Dispense:  90 tablet    Refill:  3   metoprolol succinate (TOPROL-XL) 50 MG 24 hr tablet    Sig: Take 1 tablet (50 mg total) by mouth in the morning and at bedtime. Take with or immediately following a  meal.    Dispense:  180 tablet    Refill:  3    Patient Instructions  Medication Instructions:  Your physician recommends that you  continue on your current medications as directed. Please refer to the Current Medication list given to you today.  *If you need a refill on your cardiac medications before your next appointment, please call your pharmacy*   Lab Work: None If you have labs (blood work) drawn today and your tests are completely normal, you will receive your results only by: [place] (if you have MyChart) OR A paper copy in the mail If you have any lab test that is abnormal or we need to change your treatment, we will call you to review the results.   Testing/Procedures: None  Follow-Up: At [facility], you and your health needs are our priority.  As part of our continuing mission to provide you with exceptional heart care, we have created designated Provider Care Teams.  These Care Teams include your primary Cardiologist (physician) and Advanced Practice Providers (APPs -  Physician Assistants and Nurse Practitioners) who all work together to provide you with the care you need, when you need it.   Your next appointment:   1 year(s)  Provider:   Berniece Salines, DO    Adopting a Healthy Lifestyle.  Know what a healthy weight is for you (roughly BMI <25) and aim to maintain this   Aim for 7+ servings of fruits and vegetables daily   65-80+ fluid ounces of water or unsweet tea for healthy kidneys   Limit to max 1 drink of alcohol per day; avoid smoking/tobacco   Limit animal fats in diet for cholesterol and heart health - choose grass fed whenever available   Avoid highly processed foods, and foods high in saturated/trans fats   Aim for low stress - take time to unwind and care for your mental health   Aim for 150 min of moderate intensity exercise weekly for heart health, and weights twice weekly for bone health   Aim for 7-9 hours of sleep  daily   When it comes to diets, agreement about the perfect plan isnt easy to find, even among the experts. Experts at the [employer] developed an idea known as the Healthy Eating Plate. Just imagine a plate divided into logical, healthy portions.   The emphasis is on diet quality:   Load up on vegetables and fruits - one-half of your plate: Aim for color and variety, and remember that potatoes dont count.   Go for whole grains - one-quarter of your plate: Whole wheat, barley, wheat berries, quinoa, oats, brown rice, and foods made with them. If you want pasta, go with whole wheat pasta.   Protein power - one-quarter of your plate: Fish, chicken, beans, and nuts are all healthy, versatile protein sources. Limit red meat.   The diet, however, does go beyond the plate, offering a few other suggestions.   Use healthy plant oils, such as olive, canola, soy, corn, sunflower and peanut. Check the labels, and avoid partially hydrogenated oil, which have unhealthy trans fats.   If youre thirsty, drink water. Coffee and tea are good in moderation, but skip sugary drinks and limit milk and dairy products to one or two daily servings.   The type of carbohydrate in the diet is more important than the amount. Some sources of carbohydrates, such as vegetables, fruits, whole grains, and beans-are healthier than others.   Finally, stay active  Signed, Berniece Salines, DO  06/23/2022 7:42 AM    [place]

## 2022-06-20 NOTE — Patient Instructions (Signed)
Medication Instructions:  Your physician recommends that you continue on your current medications as directed. Please refer to the Current Medication list given to you today.  *If you need a refill on your cardiac medications before your next appointment, please call your pharmacy*   Lab Work: None If you have labs (blood work) drawn today and your tests are completely normal, you will receive your results only by: [place] (if you have MyChart) OR A paper copy in the mail If you have any lab test that is abnormal or we need to change your treatment, we will call you to review the results.   Testing/Procedures: None  Follow-Up: At [facility], you and your health needs are our priority.  As part of our continuing mission to provide you with exceptional heart care, we have created designated Provider Care Teams.  These Care Teams include your primary Cardiologist (physician) and Advanced Practice Providers (APPs -  Physician Assistants and Nurse Practitioners) who all work together to provide you with the care you need, when you need it.   Your next appointment:   1 year(s)  Provider:   Berniece Salines, DO

## 2022-12-15 ENCOUNTER — Other Ambulatory Visit (HOSPITAL_COMMUNITY): Payer: Self-pay | Admitting: Cardiology

## 2022-12-15 DIAGNOSIS — I4819 Other persistent atrial fibrillation: Secondary | ICD-10-CM

## 2022-12-16 NOTE — Telephone Encounter (Signed)
Prescription refill request for Eliquis received. Indication: Afib  Last office visit: 06/20/22 ([place])  Scr: 0.58 (01/07/22)  Age: 72 Weight: 86.6kg  Appropriate dose. Refill sent.

## 2022-12-22 ENCOUNTER — Other Ambulatory Visit: Payer: Self-pay | Admitting: Cardiology

## 2023-04-03 ENCOUNTER — Telehealth: Payer: Self-pay | Admitting: *Deleted

## 2023-04-03 DIAGNOSIS — I4819 Other persistent atrial fibrillation: Secondary | ICD-10-CM

## 2023-04-03 DIAGNOSIS — Z01818 Encounter for other preprocedural examination: Secondary | ICD-10-CM

## 2023-04-03 NOTE — Telephone Encounter (Signed)
   Pre-operative Risk Assessment    Patient Name: Bethany Frederick  DOB: 10-17-1950 MRN: [id number]   Last OV:  Dr. Servando Salina 06/20/2022 Upcoming OV: 06/27/2023  Request for Surgical Clearance    Procedure:   Colonoscopy  Date of Surgery:  Clearance 05/15/23                                 Surgeon:  Dr. Vida Rigger Surgeon's Group or Practice Name:  [facility] Phone number:  [phone] Fax number:  [phone]   Type of Clearance Requested:   - Medical  - Pharmacy:  Hold Apixaban (Eliquis) Not Indicated.   Type of Anesthesia:   Propofol   Additional requests/questions:    Signed, Emmit Pomfret   04/03/2023, 3:34 PM

## 2023-04-09 NOTE — Telephone Encounter (Signed)
Patient with diagnosis of A Fib on Eliquis for anticoagulation.    Procedure: colonoscopy Date of procedure: 05/15/23   CHA2DS2-VASc Score = 4  This indicates a 4.8% annual risk of stroke. The patient's score is based upon: CHF History: 0 HTN History: 1 Diabetes History: 1 Stroke History: 0 Vascular Disease History: 0 Age Score: 1 Gender Score: 1   CrCl overdue Platelet count overdue  Patient will need updated BMP and CBC before clearance can be given  **This guidance is not considered finalized until pre-operative APP has relayed final recommendations.**

## 2023-04-10 NOTE — Telephone Encounter (Signed)
   Name: Bethany Frederick  DOB: [birth year]  MRN: [id number]  Primary Cardiologist: Thomasene Ripple, DO   Preoperative team, please contact this patient and set up a phone call appointment for further preoperative risk assessment. Please obtain consent and complete medication review. Thank you for your help.  I confirm that guidance regarding antiplatelet and oral anticoagulation therapy has been completed and, if necessary, noted below.  Will need updated BMET and CBC prior to phone call visit for PharmD recommendations on Eliquis hold.   I also confirmed the patient resides in the state of [place]. As per [facility] Medical Board telemedicine laws, the patient must reside in the state in which the provider is licensed.   Denyce Robert, NP 04/10/2023, 11:21 AM [facility]

## 2023-04-11 ENCOUNTER — Telehealth: Payer: Self-pay | Admitting: *Deleted

## 2023-04-11 NOTE — Telephone Encounter (Signed)
I s/w the pt and she is agreeable to pre op labs. Pt will get labs before 04/28/23. Pt has been scheduled for tele pre op appt 05/05/23. Med rec and consent are done. Lab orders have been placed and release for labs to be done at the [facility] office. BMET/CBC.

## 2023-04-11 NOTE — Telephone Encounter (Signed)
I s/w the pt and she is agreeable to pre op labs. Pt will get labs before 04/28/23. Pt has been scheduled for tele pre op appt 05/05/23. Med rec and consent are done. Lab orders have been placed and release for labs to be done at the [facility] office. BMET/CBC.      Patient Consent for Virtual Visit        Bethany Frederick has provided verbal consent on 04/11/2023 for a virtual visit (video or telephone).   CONSENT FOR VIRTUAL VISIT FOR:  Bethany Frederick  By participating in this virtual visit I agree to the following:  I hereby voluntarily request, consent and authorize [facility] and its employed or contracted physicians, physician assistants, nurse practitioners or other licensed health care professionals (the Practitioner), to provide me with telemedicine health care services (the "Services") as deemed necessary by the treating Practitioner. I acknowledge and consent to receive the Services by the Practitioner via telemedicine. I understand that the telemedicine visit will involve communicating with the Practitioner through live audiovisual communication technology and the disclosure of certain medical information by electronic transmission. I acknowledge that I have been given the opportunity to request an in-person assessment or other available alternative prior to the telemedicine visit and am voluntarily participating in the telemedicine visit.  I understand that I have the right to withhold or withdraw my consent to the use of telemedicine in the course of my care at any time, without affecting my right to future care or treatment, and that the Practitioner or I may terminate the telemedicine visit at any time. I understand that I have the right to inspect all information obtained and/or recorded in the course of the telemedicine visit and may receive copies of available information for a reasonable fee.  I understand that some of the potential risks of receiving the Services via  telemedicine include:  Delay or interruption in medical evaluation due to technological equipment failure or disruption; Information transmitted may not be sufficient (e.g. poor resolution of images) to allow for appropriate medical decision making by the Practitioner; and/or  In rare instances, security protocols could fail, causing a breach of personal health information.  Furthermore, I acknowledge that it is my responsibility to provide information about my medical history, conditions and care that is complete and accurate to the best of my ability. I acknowledge that Practitioner's advice, recommendations, and/or decision may be based on factors not within their control, such as incomplete or inaccurate data provided by me or distortions of diagnostic images or specimens that may result from electronic transmissions. I understand that the practice of medicine is not an exact science and that Practitioner makes no warranties or guarantees regarding treatment outcomes. I acknowledge that a copy of this consent can be made available to me via my patient portal [facility] MyChart), or I can request a printed copy by calling the office of [facility].    I understand that my insurance will be billed for this visit.   I have read or had this consent read to me. I understand the contents of this consent, which adequately explains the benefits and risks of the Services being provided via telemedicine.  I have been provided ample opportunity to ask questions regarding this consent and the Services and have had my questions answered to my satisfaction. I give my informed consent for the services to be provided through the use of telemedicine in my medical care

## 2023-04-11 NOTE — Addendum Note (Signed)
Addended by: Tarri Fuller on: 04/11/2023 01:39 PM   Modules accepted: Orders

## 2023-04-24 ENCOUNTER — Telehealth: Payer: Self-pay | Admitting: Nurse Practitioner

## 2023-04-24 LAB — CBC

## 2023-04-24 NOTE — Telephone Encounter (Signed)
   [facility] called w/ critical lab results from earlier in the day.  K 5.9.  Med list reviewed.  I attempted to contact pt via home phone and cell phone, however, no answer after multiple attempts to each number.  Message left to call back.  Lab Results  Component Value Date   NA 142 04/24/2023   CL 104 04/24/2023   K 5.9 (HH) 04/24/2023   CO2 24 04/24/2023   BUN 14 04/24/2023   CREATININE 0.70 04/24/2023   EGFR 91 04/24/2023   CALCIUM 9.9 04/24/2023   ALBUMIN 3.9 02/23/2021   GLUCOSE 109 (H) 04/24/2023     Nicolasa Ducking, NP 04/24/2023, 8:17 PM

## 2023-04-25 ENCOUNTER — Telehealth: Payer: Self-pay | Admitting: Cardiology

## 2023-04-25 DIAGNOSIS — E875 Hyperkalemia: Secondary | ICD-10-CM

## 2023-04-25 LAB — BASIC METABOLIC PANEL
BUN/Creatinine Ratio: 20 (ref 12–28)
BUN: 14 mg/dL (ref 8–27)
CO2: 24 mmol/L (ref 20–29)
Calcium: 9.9 mg/dL (ref 8.7–10.3)
Chloride: 104 mmol/L (ref 96–106)
Creatinine, Ser: 0.7 mg/dL (ref 0.57–1.00)
Glucose: 109 mg/dL — ABNORMAL HIGH (ref 70–99)
Potassium: 5.9 mmol/L (ref 3.5–5.2)
Sodium: 142 mmol/L (ref 134–144)
eGFR: 91 mL/min/{1.73_m2} (ref >59)

## 2023-04-25 LAB — CBC
Hematocrit: 38.9 % (ref 34.0–46.6)
Hemoglobin: 13.7 g/dL (ref 11.1–15.9)
MCH: 32.4 pg (ref 26.6–33.0)
MCHC: 35.2 g/dL (ref 31.5–35.7)
MCV: 92 fL (ref 79–97)
Platelets: 203 10*3/uL (ref 150–450)
RBC: 4.23 x10E6/uL (ref 3.77–5.28)
RDW: 14 % (ref 11.7–15.4)
WBC: 6.4 10*3/uL (ref 3.4–10.8)

## 2023-04-25 NOTE — Telephone Encounter (Addendum)
Received call from [employer] [facility]  Critical Lab result: K 5.9   RN spoke to DOD Dr. Servando Salina  Provider recommends:   -Repeat labs next week   -decrease/limit potassium rich food in diet

## 2023-04-25 NOTE — Telephone Encounter (Signed)
Patient identification verified by 2 forms. Marilynn Rail, RN    Called and spoke to patient  Informed patient labs yesterday showed High Potassium  Patient states:   -has not started any new OTC medications or new prescriptions   -has occasional leg cramps at night, on going for 3-9months  Patient denies:   -Chest pain   -new/worsening palpitation   -numbness/tingling in hands & feet  Advised patient:   -Limit potassium rich foods (reviewed list)   -present to lab next week for repeat  Reviewed ED warning signs/precautions  Patient verbalized understanding, no further questions at this time

## 2023-04-25 NOTE — Telephone Encounter (Signed)
[facility] is calling to report a critical lab for patient

## 2023-04-27 ENCOUNTER — Encounter: Payer: Self-pay | Admitting: Hematology

## 2023-05-01 LAB — BASIC METABOLIC PANEL
BUN/Creatinine Ratio: 18 (ref 12–28)
BUN: 14 mg/dL (ref 8–27)
CO2: 20 mmol/L (ref 20–29)
Calcium: 10 mg/dL (ref 8.7–10.3)
Chloride: 105 mmol/L (ref 96–106)
Creatinine, Ser: 0.78 mg/dL (ref 0.57–1.00)
Glucose: 104 mg/dL — ABNORMAL HIGH (ref 70–99)
Potassium: 4.9 mmol/L (ref 3.5–5.2)
Sodium: 141 mmol/L (ref 134–144)
eGFR: 80 mL/min/{1.73_m2} (ref >59)

## 2023-05-01 NOTE — Telephone Encounter (Signed)
Patient with diagnosis of A Fib on Eliquis for anticoagulation.     Procedure: colonoscopy Date of procedure: 05/15/23     CHA2DS2-VASc Score = 4  This indicates a 4.8% annual risk of stroke. The patient's score is based upon: CHF History: 0 HTN History: 1 Diabetes History: 1 Stroke History: 0 Vascular Disease History: 0 Age Score: 1 Gender Score: 1   CrCl 98 Plt 203  Patient okay to hold Eliquis for 2 days prior to procedure  Will not need bridging with enoxaparin

## 2023-05-01 NOTE — Telephone Encounter (Signed)
   Name: Bethany Frederick  DOB: 1951/04/11  MRN: [id number]  Primary Cardiologist: Thomasene Ripple, DO   Preoperative team, please contact this patient and set up a phone call appointment for further preoperative risk assessment. Please obtain consent and complete medication review. Thank you for your help.  I confirm that guidance regarding antiplatelet and oral anticoagulation therapy has been completed and, if necessary, noted below.  Patient okay to hold Eliquis for 2 days prior to procedure.  I also confirmed the patient resides in the state of [place]. As per [facility] Medical Board telemedicine laws, the patient must reside in the state in which the provider is licensed.   Ronney Asters, NP 05/01/2023, 1:53 PM [facility]

## 2023-05-05 ENCOUNTER — Ambulatory Visit: Payer: Medicare Other | Attending: Cardiovascular Disease | Admitting: Cardiology

## 2023-05-05 DIAGNOSIS — Z01810 Encounter for preprocedural cardiovascular examination: Secondary | ICD-10-CM | POA: Diagnosis not present

## 2023-05-05 NOTE — Progress Notes (Signed)
Virtual Visit via Telephone Note   Because of Bethany Frederick's co-morbid illnesses, she is at least at moderate risk for complications without adequate follow up.  This format is felt to be most appropriate for this patient at this time.  The patient did not have access to video technology/had technical difficulties with video requiring transitioning to audio format only (telephone).  All issues noted in this document were discussed and addressed.  No physical exam could be performed with this format.  Please refer to the patient's chart for her consent to telehealth for [facility].  Evaluation Performed:  Preoperative cardiovascular risk assessment _____________   Date:  05/05/2023   Patient ID:  Bethany Frederick, DOB [birth year], MRN [id number] Patient Location:  Home Provider location:   Office  Primary Care Provider:  Dois Davenport, MD Primary Cardiologist:  Thomasene Ripple, DO  Chief Complaint / Patient Profile  72 y.o. y/o female with a h/o paroxysmal atrial fibrillation, hypertension and diabetes mellitus who is pending colonoscopy on 05/15/2023 with Dr. Vida Rigger And presents today for telephonic preoperative cardiovascular risk assessment. History of Present Illness    Bethany Frederick is a 72 y.o. female who presents via audio/video conferencing for a telehealth visit today.  Pt was last seen in cardiology clinic on 06/20/2022 by Dr. Servando Salina.  At that time Bethany Frederick was doing well.  The patient is now pending procedure as outlined above. Since her last visit, she has remained stable from a cardiac standpoint.  Today she denies chest pain, shortness of breath, lower extremity edema, fatigue, palpitations, melena, hematuria, hemoptysis, diaphoresis, weakness, presyncope, syncope, orthopnea, and PND.   Past Medical History    Past Medical History:  Diagnosis Date   Arthritis    "mainly in my back, feet, hips" (03/09/2017)   Cancer of sigmoid colon (HCC) 2017   Cataract,  immature    Diabetes mellitus (HCC)    GERD (gastroesophageal reflux disease)    Heart palpitations    History of blood transfusion 2002   "related to endometrosis"   Sclerosing adenosis of left breast 02/2015   Seasonal allergies    Wears partial dentures    upper front - 1 tooth   Past Surgical History:  Procedure Laterality Date   APPENDECTOMY     BREAST BIOPSY Left 2016   BREAST LUMPECTOMY Left 03/2015   BREAST LUMPECTOMY WITH RADIOACTIVE SEED LOCALIZATION Left 03/13/2015   Procedure: BREAST LUMPECTOMY WITH RADIOACTIVE SEED LOCALIZATION;  Surgeon: Chevis Pretty III, MD;  Location: [facility];  Service: General;  Laterality: Left;   CARDIOVERSION N/A 03/18/2021   Procedure: CARDIOVERSION;  Surgeon: Lewayne Bunting, MD;  Location[facility];  Service: Cardiovascular;  Laterality: N/A;   COLECTOMY     COLONOSCOPY     ENDOMETRIAL FULGURATION  06/13/2000   HERNIA REPAIR     INGUINAL HERNIA REPAIR  03/05/2012   Procedure: HERNIA REPAIR INGUINAL INCARCERATED;  Surgeon: Axel Filler, MD;  Location: [facility];  Service: General;  Laterality: left;   INGUINAL HERNIA REPAIR Left    INSERTION OF MESH  03/05/2012   Procedure: INSERTION OF MESH;  Surgeon: Axel Filler, MD;  Location: [facility] OR;  Service: General;  Laterality: Left;   INSERTION OF MESH N/A 03/09/2017   Procedure: INSERTION OF MESH;  Surgeon: Griselda Miner, MD;  Location: [facility] OR;  Service: General;  Laterality: N/A;   LAPAROSCOPIC ASSISTED VENTRAL HERNIA REPAIR  03/09/2017   w/mesh   LAPAROSCOPIC  SIGMOID COLECTOMY Bilateral 01/28/2016   Procedure: LAPAROSCOPIC ASSISTED SIGMOID COLECTOMY;  Surgeon: Chevis Pretty III, MD;  Location: [facility] OR;  Service: General;  Laterality: Bilateral;   MYOMECTOMY  06/13/2000   PORT-A-CATH REMOVAL Right 03/09/2017   PORT-A-CATH REMOVAL N/A 03/09/2017   Procedure: REMOVAL PORT-A-CATH RIGHT CHEST;  Surgeon: Griselda Miner, MD;  Location: [facility] OR;  Service: General;  Laterality: N/A;   PORTACATH  PLACEMENT N/A 02/18/2016   Procedure: INSERTION PORT-A-CATH;  Surgeon: Chevis Pretty III, MD;  Location: [facility];  Service: General;  Laterality: N/A;   SHOULDER ARTHROSCOPY W/ ROTATOR CUFF REPAIR Left 2013   VENTRAL HERNIA REPAIR N/A 03/09/2017   Procedure: LAPAROSCOPIC VENTRAL HERNIA REPAIR;  Surgeon: Griselda Miner, MD;  Location: [facility] OR;  Service: General;  Laterality: N/A;   Allergies  No Known Allergies  Home Medications    Prior to Admission medications   Medication Sig Start Date End Date Taking? Authorizing Provider  acetaminophen (TYLENOL) 500 MG tablet Take 500 mg by mouth every 6 (six) hours as needed (pain).    [provider]  clobetasol cream (TEMOVATE) 0.05 % Apply 1 application topically 2 (two) times daily as needed (skin irritation/rash). 12/31/15   [provider]  cycloSPORINE (RESTASIS) 0.05 % ophthalmic emulsion Place 1 drop into both eyes 2 (two) times daily.    [provider]  ELIQUIS 5 MG TABS tablet TAKE 1 TABLET BY MOUTH TWICE A DAY 12/16/22   Tobb, Kardie, DO  famotidine (PEPCID) 20 MG tablet Take 20 mg by mouth daily as needed for heartburn or indigestion.  11/13/15   [provider]  fluocinonide (LIDEX) 0.05 % external solution Apply 1 application topically daily as needed (dermatitis/skin irritation.). 12/09/20   [provider]  metFORMIN (GLUCOPHAGE-XR) 500 MG 24 hr tablet Take 500 mg by mouth 2 (two) times daily. 10/05/19   [provider]  metoprolol succinate (TOPROL-XL) 50 MG 24 hr tablet TAKE 1 TABLET BY MOUTH DAILY. TAKE WITH OR IMMEDIATELY FOLLOWING A MEAL. 12/23/22   Tobb, Kardie, DO  Multiple Vitamins-Minerals (PRESERVISION AREDS 2+MULTI VIT PO) Take 1 tablet by mouth 3 (three) times a week.    [provider]  Polyethyl Glycol-Propyl Glycol (SYSTANE OP) Apply 1 drop to eye as needed.    [provider]  triamcinolone cream (KENALOG) 0.1 % Apply 1 application topically 2  (two) times daily as needed (skin irritation/rash). 09/16/20   [provider]  Vitamin D, Ergocalciferol, (DRISDOL) 50000 units CAPS capsule Take 50,000 Units by mouth once a week. 08/25/17   [provider]    Physical Exam    Vital Signs:  LAMA BURCKHARD does not have vital signs available for review today.  Given telephonic nature of communication, physical exam is limited. AAOx3. NAD. Normal affect.  Speech and respirations are unlabored.  Accessory Clinical Findings    None  Assessment & Plan    1.  Preoperative Cardiovascular Risk Assessment: Colonoscopy with Dr. Ewing Schlein on 05/15/23  Ms. Kettering's perioperative risk of a major cardiac event is 0.4% according to the Revised Cardiac Risk Index (RCRI).  Therefore, she is at low risk for perioperative complications.  Her functional capacity is good at 7.59 METs according to the Duke Activity Status Index (DASI). Recommendations: According to ACC/AHA guidelines, no further cardiovascular testing needed.  The patient may proceed to surgery at acceptable risk.   Antiplatelet and/or Anticoagulation Recommendations: Eliquis (Apixaban) can be held for 2 days prior to surgery.  Please resume post op when felt to be safe.     The patient was advised that if she develops new symptoms prior to surgery to contact our office to arrange for a follow-up visit, and she verbalized understanding.  A copy of this note will be routed to requesting surgeon.  Time:   Today, I have spent 5 minutes with the patient with telehealth technology discussing medical history, symptoms, and management plan.     Rip Harbour, NP  05/05/2023, 9:04 AM

## 2023-06-15 ENCOUNTER — Other Ambulatory Visit (HOSPITAL_COMMUNITY): Payer: Self-pay | Admitting: Cardiology

## 2023-06-15 DIAGNOSIS — I4819 Other persistent atrial fibrillation: Secondary | ICD-10-CM

## 2023-06-15 NOTE — Telephone Encounter (Signed)
 Prescription refill request for Eliquis  received. Indication: Afib  Last office visit: 06/20/22 (Tobb)  Scr: 0.78 (05/01/23)  Age: 73 Weight: 86.6kg  Appropriate dose. Refill sent.

## 2023-06-27 ENCOUNTER — Ambulatory Visit: Payer: Medicare Other | Admitting: Cardiology

## 2023-07-02 ENCOUNTER — Other Ambulatory Visit: Payer: Self-pay | Admitting: Cardiology

## 2023-08-03 ENCOUNTER — Ambulatory Visit: Payer: Medicare Other | Attending: Cardiology | Admitting: Cardiology

## 2023-08-03 ENCOUNTER — Encounter: Payer: Self-pay | Admitting: Cardiology

## 2023-08-03 VITALS — BP 160/100 | HR 86 | Ht 66.0 in | Wt 191.4 lb

## 2023-08-03 DIAGNOSIS — E119 Type 2 diabetes mellitus without complications: Secondary | ICD-10-CM

## 2023-08-03 DIAGNOSIS — E559 Vitamin D deficiency, unspecified: Secondary | ICD-10-CM | POA: Diagnosis not present

## 2023-08-03 DIAGNOSIS — R5383 Other fatigue: Secondary | ICD-10-CM

## 2023-08-03 DIAGNOSIS — I10 Essential (primary) hypertension: Secondary | ICD-10-CM | POA: Diagnosis not present

## 2023-08-03 DIAGNOSIS — I480 Paroxysmal atrial fibrillation: Secondary | ICD-10-CM | POA: Diagnosis not present

## 2023-08-03 DIAGNOSIS — Z79899 Other long term (current) drug therapy: Secondary | ICD-10-CM

## 2023-08-03 MED ORDER — LOSARTAN POTASSIUM 50 MG PO TABS: 50.0000 mg | ORAL_TABLET | Freq: Every day | ORAL | 3 refills | Status: DC

## 2023-08-03 NOTE — Patient Instructions (Signed)
 Medication Instructions:  Your physician has recommended you make the following change in your medication:  INCREASE: Losartan 50 mg once daily *If you need a refill on your cardiac medications before your next appointment, please call your pharmacy*   Lab Work: CMET, Mag, TSH, Vit D If you have labs (blood work) drawn today and your tests are completely normal, you will receive your results only by: MyChart Message (if you have MyChart) OR A paper copy in the mail If you have any lab test that is abnormal or we need to change your treatment, we will call you to review the results.  Follow-Up: At [facility], you and your health needs are our priority.  As part of our continuing mission to provide you with exceptional heart care, we have created designated Provider Care Teams.  These Care Teams include your primary Cardiologist (physician) and Advanced Practice Providers (APPs -  Physician Assistants and Nurse Practitioners) who all work together to provide you with the care you need, when you need it.  Your next appointment:   16 week(s)  Provider:   Thomasene Ripple, DO     Other Instructions   1st Floor: - Lobby - Registration  - Pharmacy  - Lab - Cafe  2nd Floor: - PV Lab - Diagnostic Testing (echo, CT, nuclear med)  3rd Floor: - Vacant  4th Floor: - TCTS (cardiothoracic surgery) - AFib Clinic - Structural Heart Clinic - Vascular Surgery  - Vascular Ultrasound  5th Floor: - [facility]Care Cardiology (general and EP) - Clinical Pharmacy for coumadin, hypertension, lipid, weight-loss medications, and med management appointments    Valet parking services will be available as well.

## 2023-08-03 NOTE — Progress Notes (Signed)
 Cardiology Office Note:    Date:  08/06/2023   ID:  Bethany Frederick, DOB 1951-04-08, MRN [id number]  PCP:  Dois Davenport, MD  Cardiologist:  Thomasene Ripple, DO  Electrophysiologist:  None   Referring MD: Dois Davenport, MD   " I am doing   History of Present Illness:    Bethany Frederick is a 73 y.o. female with a hx of diagnosed paroxysmal atrial fibrillation on metoprolol succinate and Eliquis, diabetes mellitus, presents today to establish general cardiology care.  The patient tells me that she was diagnosed with atrial fibrillation few weeks ago at which time she was seen in our A. fib clinic she was started on Eliquis after completing 3 weeks of this she was scheduled for cardioversion.   I saw the patient on March 30, 2021 at that time she was still in atrial fibrillation.  Unfortunately she had stopped her Eliquis therefore cannot pursue rhythm control strategy.  I educated the patient about staying on the Eliquis.  We will place to monitor the patient in the meantime monitor showed continuous persistent atrial fibrillation.  I also repeated the echo.   At her visit on December 13, 2021 she was doing well from a cardiovascular standpoint.  During that visit there were no changes on her medication.  Will continue her metoprolol as well as Eliquis.  At her last visit in February 2024 she was doing well from a cardiovascular standpoint.  No medication changes were made.  Since I last saw her she reports being back in AFib and has been considering an ablation procedure, but expresses nervousness about the procedure. She has been experiencing high blood pressure, for which she was prescribed losartan. She also reports feeling tired and sleepy in the evenings, and has a history of mild sleep apnea that was not treated.   Past Medical History:  Diagnosis Date   Arthritis    "mainly in my back, feet, hips" (03/09/2017)   Cancer of sigmoid colon (HCC) 2017   Cataract, immature    Diabetes  mellitus (HCC)    GERD (gastroesophageal reflux disease)    Heart palpitations    History of blood transfusion 2002   "related to endometrosis"   Sclerosing adenosis of left breast 02/2015   Seasonal allergies    Wears partial dentures    upper front - 1 tooth    Past Surgical History:  Procedure Laterality Date   APPENDECTOMY     BREAST BIOPSY Left 2016   BREAST LUMPECTOMY Left 03/2015   BREAST LUMPECTOMY WITH RADIOACTIVE SEED LOCALIZATION Left 03/13/2015   Procedure: BREAST LUMPECTOMY WITH RADIOACTIVE SEED LOCALIZATION;  Surgeon: Chevis Pretty III, MD;  Location: [facility];  Service: General;  Laterality: Left;   CARDIOVERSION N/A 03/18/2021   Procedure: CARDIOVERSION;  Surgeon: Lewayne Bunting, MD;  Location: [facility] ENDOSCOPY;  Service: Cardiovascular;  Laterality: N/A;   COLECTOMY     COLONOSCOPY     ENDOMETRIAL FULGURATION  06/13/2000   HERNIA REPAIR     INGUINAL HERNIA REPAIR  03/05/2012   Procedure: HERNIA REPAIR INGUINAL INCARCERATED;  Surgeon: Axel Filler, MD;  Location: [facility] OR;  Service: General;  Laterality: left;   INGUINAL HERNIA REPAIR Left    INSERTION OF MESH  03/05/2012   Procedure: INSERTION OF MESH;  Surgeon: Axel Filler, MD;  Location: [facility] OR;  Service: General;  Laterality: Left;   INSERTION OF MESH N/A 03/09/2017   Procedure: INSERTION OF MESH;  Surgeon: Chevis Pretty III,  MD;  Location: [facility] OR;  Service: General;  Laterality: N/A;   LAPAROSCOPIC ASSISTED VENTRAL HERNIA REPAIR  03/09/2017   w/mesh   LAPAROSCOPIC SIGMOID COLECTOMY Bilateral 01/28/2016   Procedure: LAPAROSCOPIC ASSISTED SIGMOID COLECTOMY;  Surgeon: Chevis Pretty III, MD;  Location: [facility] OR;  Service: General;  Laterality: Bilateral;   MYOMECTOMY  06/13/2000   PORT-A-CATH REMOVAL Right 03/09/2017   PORT-A-CATH REMOVAL N/A 03/09/2017   Procedure: REMOVAL PORT-A-CATH RIGHT CHEST;  Surgeon: Griselda Miner, MD;  Location: [facility] OR;  Service: General;  Laterality: N/A;   PORTACATH PLACEMENT N/A  02/18/2016   Procedure: INSERTION PORT-A-CATH;  Surgeon: Chevis Pretty III, MD;  Location: [facility];  Service: General;  Laterality: N/A;   SHOULDER ARTHROSCOPY W/ ROTATOR CUFF REPAIR Left 2013   VENTRAL HERNIA REPAIR N/A 03/09/2017   Procedure: LAPAROSCOPIC VENTRAL HERNIA REPAIR;  Surgeon: Chevis Pretty III, MD;  Location: [facility] OR;  Service: General;  Laterality: N/A;    Current Medications: Current Meds  Medication Sig   acetaminophen (TYLENOL) 500 MG tablet Take 500 mg by mouth every 6 (six) hours as needed (pain).   atorvastatin (LIPITOR) 10 MG tablet Take 10 mg by mouth daily.   clobetasol cream (TEMOVATE) 0.05 % Apply 1 application topically 2 (two) times daily as needed (skin irritation/rash).   cycloSPORINE (RESTASIS) 0.05 % ophthalmic emulsion Place 1 drop into both eyes 2 (two) times daily.   ELIQUIS 5 MG TABS tablet TAKE 1 TABLET BY MOUTH TWICE A DAY   famotidine (PEPCID) 20 MG tablet Take 20 mg by mouth daily as needed for heartburn or indigestion.    fluocinonide (LIDEX) 0.05 % external solution Apply 1 application topically daily as needed (dermatitis/skin irritation.).   losartan (COZAAR) 50 MG tablet Take 1 tablet (50 mg total) by mouth daily.   metFORMIN (GLUCOPHAGE-XR) 500 MG 24 hr tablet Take 500 mg by mouth 2 (two) times daily.   metoprolol succinate (TOPROL-XL) 50 MG 24 hr tablet TAKE 1 TABLET BY MOUTH IN THE MORNING AND AT BEDTIME. TAKE WITH OR IMMEDIATELY FOLLOWING A MEAL.   Multiple Vitamins-Minerals (PRESERVISION AREDS 2+MULTI VIT PO) Take 1 tablet by mouth 3 (three) times a week.   Polyethyl Glycol-Propyl Glycol (SYSTANE OP) Apply 1 drop to eye as needed.   triamcinolone cream (KENALOG) 0.1 % Apply 1 application topically 2 (two) times daily as needed (skin irritation/rash).   Vitamin D, Ergocalciferol, (DRISDOL) 50000 units CAPS capsule Take 50,000 Units by mouth once a week.   [DISCONTINUED] losartan (COZAAR) 25 MG tablet Take 25 mg by mouth daily.      Allergies:   Patient has no known allergies.   Social History   Socioeconomic History   Marital status: Married    Spouse name: Not on file   Number of children: Not on file   Years of education: Not on file   Highest education level: Not on file  Occupational History   Not on file  Tobacco Use   Smoking status: Never   Smokeless tobacco: Never  Vaping Use   Vaping status: Never Used  Substance and Sexual Activity   Alcohol use: Not Currently    Alcohol/week: 7.0 standard drinks of alcohol    Types: 7 Glasses of wine per week   Drug use: No   Sexual activity: Not Currently  Other Topics Concern   Not on file  Social History Narrative   Not on file   Social Drivers of Health   Financial Resource Strain: Not on  file  Food Insecurity: Not on file  Transportation Needs: Not on file  Physical Activity: Not on file  Stress: Not on file  Social Connections: Not on file     Family History: The patient's family history includes Cancer (age of onset: 26) in her paternal aunt; Cancer (age of onset: 46) in her cousin; Cancer (age of onset: 62) in her brother.  ROS:   Review of Systems  Constitution: Negative for decreased appetite, fever and weight gain.  HENT: Negative for congestion, ear discharge, hoarse voice and sore throat.   Eyes: Negative for discharge, redness, vision loss in right eye and visual halos.  Cardiovascular: Negative for chest pain, dyspnea on exertion, leg swelling, orthopnea and palpitations.  Respiratory: Negative for cough, hemoptysis, shortness of breath and snoring.   Endocrine: Negative for heat intolerance and polyphagia.  Hematologic/Lymphatic: Negative for bleeding problem. Does not bruise/bleed easily.  Skin: Negative for flushing, nail changes, rash and suspicious lesions.  Musculoskeletal: Negative for arthritis, joint pain, muscle cramps, myalgias, neck pain and stiffness.  Gastrointestinal: Negative for abdominal pain, bowel  incontinence, diarrhea and excessive appetite.  Genitourinary: Negative for decreased libido, genital sores and incomplete emptying.  Neurological: Negative for brief paralysis, focal weakness, headaches and loss of balance.  Psychiatric/Behavioral: Negative for altered mental status, depression and suicidal ideas.  Allergic/Immunologic: Negative for HIV exposure and persistent infections.    EKGs/Labs/Other Studies Reviewed:    The following studies were reviewed today:   EKG: None today  Nuclear stress test January 2023 The study is normal. The study is low risk.   1.0 mm of down sloping ST depression was noted.   LV perfusion is normal.   End diastolic cavity size is normal.   Prior study not available for comparison.   Not gated due to atrial fibrillation.   Low risk stress nuclear study with normal perfusion. Not gated due to atrial fibrillation.     ZIO monitor December 2022 Patch Wear Time:  13 days and 12 hours (2022-11-25T20:17:31-0500 to 2022-12-09T08:29:57-0500)   Atrial Fibrillation occurred continuously (100% burden), ranging from 46-189 bpm (avg of 100 bpm). Isolated VEs were rare (<1.0%, 1255), VE Couplets were rare (<1.0%, 1), and VE Triplets were rare (<1.0%, 1).    Conclusion: Persistent atrial fibrillation.  Transthoracic echocardiogram April 19, 2021 IMPRESSIONS     1. Left ventricular ejection fraction, by estimation, is 45 to 50%. The  left ventricle has mildly decreased function. The left ventricle  demonstrates global hypokinesis. Diastolic function indeterminant due to  AFib.   2. Right ventricular systolic function is normal. The right ventricular  size is normal.   3. Left atrial size was mildly dilated.   4. The mitral valve is degenerative. Mild mitral valve regurgitation.   5. Tricuspid valve regurgitation is mild to moderate.   6. The aortic valve is tricuspid. There is mild calcification of the  aortic valve. There is mild thickening of  the aortic valve. Aortic valve  regurgitation is not visualized. Aortic valve sclerosis/calcification is  present, without any evidence of  aortic stenosis.   Comparison(s): Compared to prior TTE in 2019, the patient is now in AFib  and EF appears mildly reduced at 45-50% (previously 60-65%).   FINDINGS   Left Ventricle: Left ventricular ejection fraction, by estimation, is 45  to 50%. The left ventricle has mildly decreased function. The left  ventricle demonstrates global hypokinesis. The left ventricular internal  cavity size was normal in size. There is   borderline  concentric left ventricular hypertrophy. Diastolic function  indeterminant due to AFib.   Right Ventricle: The right ventricular size is normal. Right vetricular  wall thickness was not well visualized. Right ventricular systolic  function is normal.   Left Atrium: Left atrial size was mildly dilated.   Right Atrium: Right atrial size was normal in size.   Pericardium: There is no evidence of pericardial effusion.   Mitral Valve: The mitral valve is degenerative in appearance. There is  mild thickening of the mitral valve leaflet(s). There is mild  calcification of the mitral valve leaflet(s). Mild to moderate mitral  annular calcification. Mild mitral valve  regurgitation.   Tricuspid Valve: The tricuspid valve is normal in structure. Tricuspid  valve regurgitation is mild to moderate.   Aortic Valve: The aortic valve is tricuspid. There is mild calcification  of the aortic valve. There is mild thickening of the aortic valve. Aortic  valve regurgitation is not visualized. Aortic valve  sclerosis/calcification is present, without any  evidence of aortic stenosis.   Pulmonic Valve: The pulmonic valve was normal in structure. Pulmonic valve  regurgitation is trivial.   Aorta: The aortic root and ascending aorta are structurally normal, with  no evidence of dilitation.   IAS/Shunts: No atrial level shunt  detected by color flow Doppler.       Recent Labs: 04/24/2023: Hemoglobin 13.7; Platelets 203 08/03/2023: ALT 22; BUN 16; Creatinine, Ser 0.63; Magnesium 1.8; Potassium 5.3; Sodium 139; TSH 2.980  Recent Lipid Panel No results found for: "CHOL", "TRIG", "HDL", "CHOLHDL", "VLDL", "LDLCALC", "LDLDIRECT"  Physical Exam:    VS:  BP (!) 160/100   Pulse 86   Ht 5\' 6"  (1.676 m)   Wt 191 lb 6.4 oz (86.8 kg)   SpO2 97%   BMI 30.89 kg/m     Wt Readings from Last 3 Encounters:  08/03/23 191 lb 6.4 oz (86.8 kg)  06/20/22 191 lb (86.6 kg)  12/13/21 193 lb 6.4 oz (87.7 kg)     GEN: Well nourished, well developed in no acute distress HEENT: Normal NECK: No JVD; No carotid bruits LYMPHATICS: No lymphadenopathy CARDIAC: S1S2 noted,RRR, no murmurs, rubs, gallops RESPIRATORY:  Clear to auscultation without rales, wheezing or rhonchi  ABDOMEN: Soft, non-tender, non-distended, +bowel sounds, no guarding. EXTREMITIES: Trace nonpitting edema, No cyanosis, no clubbing MUSCULOSKELETAL:  No deformity  SKIN: Warm and dry NEUROLOGIC:  Alert and oriented x 3, non-focal PSYCHIATRIC:  Normal affect, good insight  ASSESSMENT:    1. Primary hypertension   2. PAF (paroxysmal atrial fibrillation) (HCC)   3. Vitamin D deficiency   4. Fatigue, unspecified type   5. Medication management   6. Non-insulin dependent type 2 diabetes mellitus (HCC)   7. Morbid obesity (HCC)     PLAN:    Atrial Fibrillation Recurrent atrial fibrillation with hesitancy towards ablation for rhythm strategy. Emphasized ablation safety and encouraged consideration . -She plans to discuss ablation with friends who have undergone the procedure.  Hypertension Blood pressure elevated at 160/100 mmHg.  - Increase losartan to 50 mg once daily. - Consider adding hydrochlorothiazide if blood pressure remains uncontrolled. - Schedule follow-up with pharmacist in six weeks for medication titration.  Sleep Apnea Mild sleep apnea  with excessive evening sleepiness. Discussed potential progression and need for retesting if symptoms persist post blood pressure control. - Monitor symptoms after blood pressure control. - Consider retesting for sleep apnea if symptoms persist in 16 weeks.   The patient is in agreement with the above  plan. The patient left the office in stable condition.  The patient will follow up in 12 months and sooner if needed.   Medication Adjustments/Labs and Tests Ordered: Current medicines are reviewed at length with the patient today.  Concerns regarding medicines are outlined above.  Orders Placed This Encounter  Procedures   Comprehensive Metabolic Panel (CMET)   Magnesium   TSH+T4F+T3Free   VITAMIN D 25 Hydroxy (Vit-D Deficiency, Fractures)   AMB Referral to [facility] Pharm-D   EKG 12-Lead   Meds ordered this encounter  Medications   losartan (COZAAR) 50 MG tablet    Sig: Take 1 tablet (50 mg total) by mouth daily.    Dispense:  90 tablet    Refill:  3    Patient Instructions  Medication Instructions:  Your physician has recommended you make the following change in your medication:  INCREASE: Losartan 50 mg once daily *If you need a refill on your cardiac medications before your next appointment, please call your pharmacy*   Lab Work: CMET, Mag, TSH, Vit D If you have labs (blood work) drawn today and your tests are completely normal, you will receive your results only by: MyChart Message (if you have MyChart) OR A paper copy in the mail If you have any lab test that is abnormal or we need to change your treatment, we will call you to review the results.  Follow-Up: At [facility], you and your health needs are our priority.  As part of our continuing mission to provide you with exceptional heart care, we have created designated Provider Care Teams.  These Care Teams include your primary Cardiologist (physician) and Advanced Practice Providers (APPs -  Physician  Assistants and Nurse Practitioners) who all work together to provide you with the care you need, when you need it.  Your next appointment:   16 week(s)  Provider:   Thomasene Ripple, DO     Other Instructions   1st Floor: - Lobby - Registration  - Pharmacy  - Lab - Cafe  2nd Floor: - PV Lab - Diagnostic Testing (echo, CT, nuclear med)  3rd Floor: - Vacant  4th Floor: - TCTS (cardiothoracic surgery) - AFib Clinic - Structural Heart Clinic - Vascular Surgery  - Vascular Ultrasound  5th Floor: - [facility]Care Cardiology (general and EP) - Clinical Pharmacy for coumadin, hypertension, lipid, weight-loss medications, and med management appointments    Valet parking services will be available as well.         Adopting a Healthy Lifestyle.  Know what a healthy weight is for you (roughly BMI <25) and aim to maintain this   Aim for 7+ servings of fruits and vegetables daily   65-80+ fluid ounces of water or unsweet tea for healthy kidneys   Limit to max 1 drink of alcohol per day; avoid smoking/tobacco   Limit animal fats in diet for cholesterol and heart health - choose grass fed whenever available   Avoid highly processed foods, and foods high in saturated/trans fats   Aim for low stress - take time to unwind and care for your mental health   Aim for 150 min of moderate intensity exercise weekly for heart health, and weights twice weekly for bone health   Aim for 7-9 hours of sleep daily   When it comes to diets, agreement about the perfect plan isnt easy to find, even among the experts. Experts at the [facility] of [employer] developed an idea known as the Healthy Eating Plate.  Just imagine a plate divided into logical, healthy portions.   The emphasis is on diet quality:   Load up on vegetables and fruits - one-half of your plate: Aim for color and variety, and remember that potatoes dont count.   Go for whole grains - one-quarter of your plate: Whole  wheat, barley, wheat berries, quinoa, oats, brown rice, and foods made with them. If you want pasta, go with whole wheat pasta.   Protein power - one-quarter of your plate: Fish, chicken, beans, and nuts are all healthy, versatile protein sources. Limit red meat.   The diet, however, does go beyond the plate, offering a few other suggestions.   Use healthy plant oils, such as olive, canola, soy, corn, sunflower and peanut. Check the labels, and avoid partially hydrogenated oil, which have unhealthy trans fats.   If youre thirsty, drink water. Coffee and tea are good in moderation, but skip sugary drinks and limit milk and dairy products to one or two daily servings.   The type of carbohydrate in the diet is more important than the amount. Some sources of carbohydrates, such as vegetables, fruits, whole grains, and beans-are healthier than others.   Finally, stay active  Signed, Thomasene Ripple, DO  08/06/2023 4:29 AM     [facility]

## 2023-08-04 LAB — COMPREHENSIVE METABOLIC PANEL WITH GFR
ALT: 22 IU/L (ref 0–32)
AST: 22 IU/L (ref 0–40)
Albumin: 4.6 g/dL (ref 3.8–4.8)
Alkaline Phosphatase: 177 IU/L — ABNORMAL HIGH (ref 44–121)
BUN/Creatinine Ratio: 25 (ref 12–28)
BUN: 16 mg/dL (ref 8–27)
Bilirubin Total: 0.5 mg/dL (ref 0.0–1.2)
CO2: 21 mmol/L (ref 20–29)
Calcium: 9.7 mg/dL (ref 8.7–10.3)
Chloride: 103 mmol/L (ref 96–106)
Creatinine, Ser: 0.63 mg/dL (ref 0.57–1.00)
Globulin, Total: 1.9 g/dL (ref 1.5–4.5)
Glucose: 104 mg/dL — ABNORMAL HIGH (ref 70–99)
Potassium: 5.3 mmol/L — ABNORMAL HIGH (ref 3.5–5.2)
Sodium: 139 mmol/L (ref 134–144)
Total Protein: 6.5 g/dL (ref 6.0–8.5)
eGFR: 94 mL/min/{1.73_m2} (ref >59)

## 2023-08-04 LAB — MAGNESIUM: Magnesium: 1.8 mg/dL (ref 1.6–2.3)

## 2023-08-04 LAB — TSH+T4F+T3FREE
Free T4: 1.34 ng/dL (ref 0.82–1.77)
T3, Free: 2.8 pg/mL (ref 2.0–4.4)
TSH: 2.98 u[IU]/mL (ref 0.450–4.500)

## 2023-08-04 LAB — VITAMIN D 25 HYDROXY (VIT D DEFICIENCY, FRACTURES): Vit D, 25-Hydroxy: 77.5 ng/mL (ref 30.0–100.0)

## 2023-08-10 ENCOUNTER — Encounter: Payer: Self-pay | Admitting: Cardiology

## 2023-08-23 ENCOUNTER — Other Ambulatory Visit: Payer: Self-pay

## 2023-08-23 DIAGNOSIS — Z79899 Other long term (current) drug therapy: Secondary | ICD-10-CM

## 2023-08-23 NOTE — Progress Notes (Signed)
Orders for repeat labs placed.

## 2023-10-04 ENCOUNTER — Encounter: Payer: Self-pay | Admitting: Pharmacist Clinician (PhC)/ Clinical Pharmacy Specialist

## 2023-10-04 ENCOUNTER — Ambulatory Visit: Attending: Cardiovascular Disease | Admitting: Pharmacist Clinician (PhC)/ Clinical Pharmacy Specialist

## 2023-10-04 VITALS — BP 138/80 | HR 76

## 2023-10-04 DIAGNOSIS — I10 Essential (primary) hypertension: Secondary | ICD-10-CM

## 2023-10-04 MED ORDER — OLMESARTAN MEDOXOMIL 20 MG PO TABS: 20.0000 mg | ORAL_TABLET | Freq: Every day | ORAL | 3 refills | Status: AC

## 2023-10-04 NOTE — Progress Notes (Signed)
 Office Visit    Patient Name: Bethany Frederick Date of Encounter: 10/04/2023  Primary Care Provider:  Allene Ivan, MD Primary Cardiologist:  Kardie Tobb, DO  Chief Complaint    Hypertension  Significant Past Medical History   AF CHADS2-VASC = on Eliquis , metoprolol   DM2 No labs available, followed by PCP, on metformin 500 mg bid  OSA Mild, no CPAP at this time          No Known Allergies  History of Present Illness    Bethany Frederick is a 74 y.o. female patient of Dr Emmette Harms, in the office today for hypertension evaluation.  Bethany Frederick was last seen by Dr. Emmette Harms in March, at which time her pressure was uncontrolled at 160/100.  She was on losartan  50 mg daily and metoprolol  succ 50 mg twice daily at the time and she was asked to monitor home readings.  Today she returns for follow up.  Notes her home readings are mostly in the 140/80 range.  Her only medication concern today is that she has developed odorous flatulence and her PCP wants to take her off the atorvastatin for a week or two to see if this might be the cause.  Assured her that this would be fine to do.  Suspect more likely due to the metformin, or even a combination of the two.    Blood Pressure Goal:  130/80  Current Medications:  losartan  50 mg every day, metoprolol  succ 50 mg bid   Previously tried:  nkda  Family Hx: both parents had hypertension; father had more heart issues, died at 72 and 63    Social Hx:      Tobacco: no  Alcohol: wine daily  Caffeine: 2 coffee in the morning, 1 glass of tea (un-sweet)  Diet:  mix - meat and vegetables (mostly chicken; veggies frozen; snacks regularly, admits to more than should, mostly sweets    Exercise: starting PT on feet/ankles; on the go most of the day  Home BP readings: checking regularly 140/80's (nothing < 130 or > 150) has two cuffs, one arm and one wrist; notes they seem to read very closely with each other.    Accessory Clinical Findings    Lab Results   Component Value Date   CREATININE 0.63 08/03/2023   BUN 16 08/03/2023   NA 139 08/03/2023   K 5.3 (H) 08/03/2023   CL 103 08/03/2023   CO2 21 08/03/2023   Lab Results  Component Value Date   ALT 22 08/03/2023   AST 22 08/03/2023   GGT 163 (H) 03/15/2016   ALKPHOS 177 (H) 08/03/2023   BILITOT 0.5 08/03/2023   No results found for: "HGBA1C"  Home Medications    Current Outpatient Medications  Medication Sig Dispense Refill   olmesartan  (BENICAR ) 20 MG tablet Take 1 tablet (20 mg total) by mouth daily. 90 tablet 3   acetaminophen  (TYLENOL ) 500 MG tablet Take 500 mg by mouth every 6 (six) hours as needed (pain).     atorvastatin (LIPITOR) 10 MG tablet Take 10 mg by mouth daily.     clobetasol cream (TEMOVATE) 0.05 % Apply 1 application topically 2 (two) times daily as needed (skin irritation/rash).     cycloSPORINE (RESTASIS) 0.05 % ophthalmic emulsion Place 1 drop into both eyes 2 (two) times daily.     ELIQUIS  5 MG TABS tablet TAKE 1 TABLET BY MOUTH TWICE A DAY 60 tablet 5   famotidine  (PEPCID ) 20 MG tablet Take  20 mg by mouth daily as needed for heartburn or indigestion.   0   fluocinonide (LIDEX) 0.05 % external solution Apply 1 application topically daily as needed (dermatitis/skin irritation.).     metFORMIN (GLUCOPHAGE-XR) 500 MG 24 hr tablet Take 500 mg by mouth 2 (two) times daily.     metoprolol  succinate (TOPROL -XL) 50 MG 24 hr tablet TAKE 1 TABLET BY MOUTH IN THE MORNING AND AT BEDTIME. TAKE WITH OR IMMEDIATELY FOLLOWING A MEAL. 180 tablet 3   Multiple Vitamins-Minerals (PRESERVISION AREDS 2+MULTI VIT PO) Take 1 tablet by mouth 3 (three) times a week.     Polyethyl Glycol-Propyl Glycol (SYSTANE OP) Apply 1 drop to eye as needed.     triamcinolone cream (KENALOG) 0.1 % Apply 1 application topically 2 (two) times daily as needed (skin irritation/rash).     Vitamin D , Ergocalciferol , (DRISDOL) 50000 units CAPS capsule Take 50,000 Units by mouth once a week.     No current  facility-administered medications for this visit.   Facility-Administered Medications Ordered in Other Visits  Medication Dose Route Frequency Provider Last Rate Last Admin   sodium chloride  flush (NS) 0.9 % injection 10 mL  10 mL Intravenous PRN Sonja Allardt, MD   10 mL at 05/31/16 1303         Assessment & Plan    Primary hypertension Assessment: BP is uncontrolled in office BP 138/80 mmHg;  above the goal (<130/80). Home readings similar to that in the office today Tolerates losartan  and metoprolol  well, without any side effects Denies SOB, palpitation, chest pain, headaches,or swelling Reiterated the importance of regular exercise and low salt diet   Plan:  Stop taking losartan   Start taking olmesartan 20 mg once daily Continue taking metoprolol  50 mg bid Patient to keep record of BP readings with heart rate and report to us  at the next visit If not at goal, consider increasing olmesartan or switching metoprolol  to carvedilol; to avoid increase in pill burden. Patient to follow up with Dr. Emmette Harms in July  Labs ordered today: none    Donivan Furry PharmD CPP [facility]  [address] [place], [place] [address] [phone]

## 2023-10-04 NOTE — Assessment & Plan Note (Signed)
 Assessment: BP is uncontrolled in office BP 138/80 mmHg;  above the goal (<130/80). Home readings similar to that in the office today Tolerates losartan  and metoprolol  well, without any side effects Denies SOB, palpitation, chest pain, headaches,or swelling Reiterated the importance of regular exercise and low salt diet   Plan:  Stop taking losartan   Start taking olmesartan  20 mg once daily Continue taking metoprolol  50 mg bid Patient to keep record of BP readings with heart rate and report to us  at the next visit If not at goal, consider increasing olmesartan  or switching metoprolol  to carvedilol; to avoid increase in pill burden. Patient to follow up with Dr. Emmette Harms in July  Labs ordered today: none

## 2023-10-04 NOTE — Patient Instructions (Signed)
 Follow up appointment: With Dr. Emmette Harms in July  Take your BP meds as follows:  STOP LOSARTAN   START OLMESARTAN 20 MG ONCE DAILY  Check your blood pressure at home daily (if able) and keep record of the readings.  Your blood pressure goal is < 130/80  To check your pressure at home you will need to:  1. Sit up in a chair, with feet flat on the floor and back supported. Do not cross your ankles or legs. 2. Rest your left arm so that the cuff is about heart level. If the cuff goes on your upper arm,  then just relax the arm on the table, arm of the chair or your lap. If you have a wrist cuff, we  suggest relaxing your wrist against your chest (think of it as Pledging the Flag with the  wrong arm).  3. Place the cuff snugly around your arm, about 1 inch above the crook of your elbow. The  cords should be inside the groove of your elbow.  4. Sit quietly, with the cuff in place, for about 5 minutes. After that 5 minutes press the power  button to start a reading. 5. Do not talk or move while the reading is taking place.  6. Record your readings on a sheet of paper. Although most cuffs have a memory, it is often  easier to see a pattern developing when the numbers are all in front of you.  7. You can repeat the reading after 1-3 minutes if it is recommended  Make sure your bladder is empty and you have not had caffeine or tobacco within the last 30 min  Always bring your blood pressure log with you to your appointments. If you have not brought your monitor in to be double checked for accuracy, please bring it to your next appointment.  You can find a list of quality blood pressure cuffs at [email or web address]  Important lifestyle changes to control high blood pressure  Intervention  Effect on the BP  Lose extra pounds and watch your waistline Weight loss is one of the most effective lifestyle changes for controlling blood pressure. If you're overweight or obese, losing even a small amount of  weight can help reduce blood pressure. Blood pressure might go down by about 1 millimeter of mercury (mm Hg) with each kilogram (about 2.2 pounds) of weight lost.  Exercise regularly As a general goal, aim for at least 30 minutes of moderate physical activity every day. Regular physical activity can lower high blood pressure by about 5 to 8 mm Hg.  Eat a healthy diet Eating a diet rich in whole grains, fruits, vegetables, and low-fat dairy products and low in saturated fat and cholesterol. A healthy diet can lower high blood pressure by up to 11 mm Hg.  Reduce salt (sodium) in your diet Even a small reduction of sodium in the diet can improve heart health and reduce high blood pressure by about 5 to 6 mm Hg.  Limit alcohol One drink equals 12 ounces of beer, 5 ounces of wine, or 1.5 ounces of 80-proof liquor.  Limiting alcohol to less than one drink a day for women or two drinks a day for men can help lower blood pressure by about 4 mm Hg.   If you have any questions or concerns please use My Chart to send questions or call the office at [phone]

## 2023-11-23 ENCOUNTER — Encounter: Payer: Self-pay | Admitting: *Deleted

## 2023-11-24 ENCOUNTER — Ambulatory Visit: Attending: Cardiology | Admitting: Cardiology

## 2023-11-24 ENCOUNTER — Encounter: Payer: Self-pay | Admitting: Cardiology

## 2023-11-24 VITALS — BP 126/78 | HR 90 | Ht 66.0 in | Wt 186.8 lb

## 2023-11-24 DIAGNOSIS — G4733 Obstructive sleep apnea (adult) (pediatric): Secondary | ICD-10-CM | POA: Diagnosis not present

## 2023-11-24 DIAGNOSIS — I4819 Other persistent atrial fibrillation: Secondary | ICD-10-CM | POA: Diagnosis not present

## 2023-11-24 DIAGNOSIS — I361 Nonrheumatic tricuspid (valve) insufficiency: Secondary | ICD-10-CM

## 2023-11-24 DIAGNOSIS — I10 Essential (primary) hypertension: Secondary | ICD-10-CM

## 2023-11-24 DIAGNOSIS — R931 Abnormal findings on diagnostic imaging of heart and coronary circulation: Secondary | ICD-10-CM | POA: Diagnosis not present

## 2023-11-24 NOTE — Patient Instructions (Signed)
 Medication Instructions:  Your physician recommends that you continue on your current medications as directed. Please refer to the Current Medication list given to you today.  *If you need a refill on your cardiac medications before your next appointment, please call your pharmacy*  Testing/Procedures: Your physician has requested that you have an echocardiogram. Echocardiography is a painless test that uses sound waves to create images of your heart. It provides your doctor with information about the size and shape of your heart and how well your heart's chambers and valves are working. This procedure takes approximately one hour. There are no restrictions for this procedure. Please do NOT wear cologne, perfume, aftershave, or lotions (deodorant is allowed). Please arrive 15 minutes prior to your appointment time.  Please note: We ask at that you not bring children with you during ultrasound (echo/ vascular) testing. Due to room size and safety concerns, children are not allowed in the ultrasound rooms during exams. Our front office staff cannot provide observation of children in our lobby area while testing is being conducted. An adult accompanying a patient to their appointment will only be allowed in the ultrasound room at the discretion of the ultrasound technician under special circumstances. We apologize for any inconvenience.   Follow-Up: At [facility] you and your health needs are our priority.  As part of our continuing mission to provide you with exceptional heart care, our providers are all part of one team.  This team includes your primary Cardiologist (physician) and Advanced Practice Providers or APPs (Physician Assistants and Nurse Practitioners) who all work together to provide you with the care you need, when you need it.  Your next appointment:   9 month(s)  Provider:   Kardie Tobb, DO

## 2023-11-26 NOTE — Progress Notes (Signed)
 Cardiology Office Note:    Date:  11/26/2023   ID:  Bethany Frederick, DOB Jun 01, 1950, MRN [id number]  PCP:  Burney Darice CROME, MD  Cardiologist:  Dub Huntsman, DO  Electrophysiologist:  None   Referring MD: Burney Darice CROME, MD    I am doing   History of Present Illness:    Bethany Frederick is a 73 y.o. female with a hx of diagnosed paroxysmal atrial fibrillation on metoprolol  succinate and Eliquis , diabetes mellitus, presents today to establish general cardiology care.  The patient tells me that she was diagnosed with atrial fibrillation few weeks ago at which time she was seen in our A. fib clinic she was started on Eliquis  after completing 3 weeks of this she was scheduled for cardioversion.   I saw the patient on March 30, 2021 at that time she was still in atrial fibrillation.  Unfortunately she had stopped her Eliquis  therefore cannot pursue rhythm control strategy.  I educated the patient about staying on the Eliquis .  We will place to monitor the patient in the meantime monitor showed continuous persistent atrial fibrillation.  I also repeated the echo.   At her visit on December 13, 2021 she was doing well from a cardiovascular standpoint.  During that visit there were no changes on her medication.  Will continue her metoprolol  as well as Eliquis .  At her visit in February 2024 she was doing well from a cardiovascular standpoint.  No medication changes were made.  At her visit on August 03, 2023 at that time we talked about rate control versus rhythm control.  I did share with the patient that she will benefit from rhythm control but she was still hesitant about this.  Her blood pressure during that visit was elevated I increased her losartan  to 50 mg daily.  Since her visit with me she was seen in our pharmacist hypertension clinic losartan  was stopped she was transition to olmesartan  20 mg once a day and she continued on her Toprol -XL 50 mg twice daily.  Today she has questions about  the ablation again.  She mentions that she has had some mild discomfort which happen on 1 occasion since I saw her.  No shortness of breath.  She also had questions about her EKG I did explain to the patient about the EKG findings and explained the comparison with her.  Past Medical History:  Diagnosis Date   Arthritis    mainly in my back, feet, hips (03/09/2017)   Cancer of sigmoid colon (HCC) 2017   Cataract, immature    Diabetes mellitus (HCC)    GERD (gastroesophageal reflux disease)    Heart palpitations    History of blood transfusion 2002   related to endometrosis   Sclerosing adenosis of left breast 02/2015   Seasonal allergies    Wears partial dentures    upper front - 1 tooth    Past Surgical History:  Procedure Laterality Date   APPENDECTOMY     BREAST BIOPSY Left 2016   BREAST LUMPECTOMY Left 03/2015   BREAST LUMPECTOMY WITH RADIOACTIVE SEED LOCALIZATION Left 03/13/2015   Procedure: BREAST LUMPECTOMY WITH RADIOACTIVE SEED LOCALIZATION;  Surgeon: Deward Null III, MD;  Location: [facility];  Service: General;  Laterality: Left;   CARDIOVERSION N/A 03/18/2021   Procedure: CARDIOVERSION;  Surgeon: Pietro Redell GORMAN, MD;  Location: [facility] ENDOSCOPY;  Service: Cardiovascular;  Laterality: N/A;   COLECTOMY     COLONOSCOPY     ENDOMETRIAL FULGURATION  06/13/2000   HERNIA REPAIR     INGUINAL HERNIA REPAIR  03/05/2012   Procedure: HERNIA REPAIR INGUINAL INCARCERATED;  Surgeon: Lynda Leos, MD;  Location: [facility] OR;  Service: General;  Laterality: left;   INGUINAL HERNIA REPAIR Left    INSERTION OF MESH  03/05/2012   Procedure: INSERTION OF MESH;  Surgeon: Lynda Leos, MD;  Location: [facility] OR;  Service: General;  Laterality: Left;   INSERTION OF MESH N/A 03/09/2017   Procedure: INSERTION OF MESH;  Surgeon: Curvin Deward MOULD, MD;  Location: [facility] OR;  Service: General;  Laterality: N/A;   LAPAROSCOPIC ASSISTED VENTRAL HERNIA REPAIR  03/09/2017   w/mesh   LAPAROSCOPIC  SIGMOID COLECTOMY Bilateral 01/28/2016   Procedure: LAPAROSCOPIC ASSISTED SIGMOID COLECTOMY;  Surgeon: Deward Curvin III, MD;  Location: [facility] OR;  Service: General;  Laterality: Bilateral;   MYOMECTOMY  06/13/2000   PORT-A-CATH REMOVAL Right 03/09/2017   PORT-A-CATH REMOVAL N/A 03/09/2017   Procedure: REMOVAL PORT-A-CATH RIGHT CHEST;  Surgeon: Curvin Deward MOULD, MD;  Location: [facility] OR;  Service: General;  Laterality: N/A;   PORTACATH PLACEMENT N/A 02/18/2016   Procedure: INSERTION PORT-A-CATH;  Surgeon: Deward Curvin III, MD;  Location: [facility];  Service: General;  Laterality: N/A;   SHOULDER ARTHROSCOPY W/ ROTATOR CUFF REPAIR Left 2013   VENTRAL HERNIA REPAIR N/A 03/09/2017   Procedure: LAPAROSCOPIC VENTRAL HERNIA REPAIR;  Surgeon: Curvin Deward III, MD;  Location: [facility] OR;  Service: General;  Laterality: N/A;    Current Medications: Current Meds  Medication Sig   acetaminophen  (TYLENOL ) 500 MG tablet Take 500 mg by mouth every 6 (six) hours as needed (pain).   albuterol (VENTOLIN HFA) 108 (90 Base) MCG/ACT inhaler Inhale 1-2 puffs into the lungs every 6 (six) hours as needed for wheezing or shortness of breath.   atorvastatin (LIPITOR) 10 MG tablet Take 10 mg by mouth daily.   clobetasol cream (TEMOVATE) 0.05 % Apply 1 application topically 2 (two) times daily as needed (skin irritation/rash).   cycloSPORINE (RESTASIS) 0.05 % ophthalmic emulsion Place 1 drop into both eyes 2 (two) times daily.   ELIQUIS  5 MG TABS tablet TAKE 1 TABLET BY MOUTH TWICE A DAY   famotidine  (PEPCID ) 20 MG tablet Take 20 mg by mouth daily as needed for heartburn or indigestion.    fluocinonide (LIDEX) 0.05 % external solution Apply 1 application topically daily as needed (dermatitis/skin irritation.).   metFORMIN (GLUCOPHAGE-XR) 500 MG 24 hr tablet Take 500 mg by mouth 2 (two) times daily.   metoprolol  succinate (TOPROL -XL) 50 MG 24 hr tablet TAKE 1 TABLET BY MOUTH IN THE MORNING AND AT BEDTIME. TAKE WITH OR IMMEDIATELY  FOLLOWING A MEAL.   montelukast (SINGULAIR) 10 MG tablet Take 10 mg by mouth at bedtime.   Multiple Vitamins-Minerals (PRESERVISION AREDS 2+MULTI VIT PO) Take 1 tablet by mouth 3 (three) times a week.   olmesartan  (BENICAR ) 20 MG tablet Take 1 tablet (20 mg total) by mouth daily.   omeprazole (PRILOSEC) 40 MG capsule Take 40 mg by mouth daily.   Polyethyl Glycol-Propyl Glycol (SYSTANE OP) Apply 1 drop to eye as needed.   triamcinolone cream (KENALOG) 0.1 % Apply 1 application topically 2 (two) times daily as needed (skin irritation/rash).   Vitamin D , Ergocalciferol , (DRISDOL) 50000 units CAPS capsule Take 50,000 Units by mouth once a week.   WIXELA INHUB 250-50 MCG/ACT AEPB Inhale 1 puff into the lungs in the morning and at bedtime.     Allergies:   Patient has no  known allergies.   Social History   Socioeconomic History   Marital status: Married    Spouse name: Not on file   Number of children: Not on file   Years of education: Not on file   Highest education level: Not on file  Occupational History   Not on file  Tobacco Use   Smoking status: Never   Smokeless tobacco: Never  Vaping Use   Vaping status: Never Used  Substance and Sexual Activity   Alcohol use: Not Currently    Alcohol/week: 7.0 standard drinks of alcohol    Types: 7 Glasses of wine per week   Drug use: No   Sexual activity: Not Currently  Other Topics Concern   Not on file  Social History Narrative   Not on file   Social Drivers of Health   Financial Resource Strain: Not on file  Food Insecurity: Not on file  Transportation Needs: Not on file  Physical Activity: Not on file  Stress: Not on file  Social Connections: Not on file     Family History: The patient's family history includes Cancer (age of onset: 6) in her paternal aunt; Cancer (age of onset: 52) in her cousin; Cancer (age of onset: 57) in her brother.  ROS:   Review of Systems  Constitution: Negative for decreased appetite, fever and  weight gain.  HENT: Negative for congestion, ear discharge, hoarse voice and sore throat.   Eyes: Negative for discharge, redness, vision loss in right eye and visual halos.  Cardiovascular: Negative for chest pain, dyspnea on exertion, leg swelling, orthopnea and palpitations.  Respiratory: Negative for cough, hemoptysis, shortness of breath and snoring.   Endocrine: Negative for heat intolerance and polyphagia.  Hematologic/Lymphatic: Negative for bleeding problem. Does not bruise/bleed easily.  Skin: Negative for flushing, nail changes, rash and suspicious lesions.  Musculoskeletal: Negative for arthritis, joint pain, muscle cramps, myalgias, neck pain and stiffness.  Gastrointestinal: Negative for abdominal pain, bowel incontinence, diarrhea and excessive appetite.  Genitourinary: Negative for decreased libido, genital sores and incomplete emptying.  Neurological: Negative for brief paralysis, focal weakness, headaches and loss of balance.  Psychiatric/Behavioral: Negative for altered mental status, depression and suicidal ideas.  Allergic/Immunologic: Negative for HIV exposure and persistent infections.    EKGs/Labs/Other Studies Reviewed:    The following studies were reviewed today:   EKG: None today  Nuclear stress test January 2023 The study is normal. The study is low risk.   1.0 mm of down sloping ST depression was noted.   LV perfusion is normal.   End diastolic cavity size is normal.   Prior study not available for comparison.   Not gated due to atrial fibrillation.   Low risk stress nuclear study with normal perfusion. Not gated due to atrial fibrillation.     ZIO monitor December 2022 Patch Wear Time:  13 days and 12 hours (2022-11-25T20:17:31-0500 to 2022-12-09T08:29:57-0500)   Atrial Fibrillation occurred continuously (100% burden), ranging from 46-189 bpm (avg of 100 bpm). Isolated VEs were rare (<1.0%, 1255), VE Couplets were rare (<1.0%, 1), and VE Triplets  were rare (<1.0%, 1).    Conclusion: Persistent atrial fibrillation.  Transthoracic echocardiogram April 19, 2021 IMPRESSIONS     1. Left ventricular ejection fraction, by estimation, is 45 to 50%. The  left ventricle has mildly decreased function. The left ventricle  demonstrates global hypokinesis. Diastolic function indeterminant due to  AFib.   2. Right ventricular systolic function is normal. The right ventricular  size is normal.  3. Left atrial size was mildly dilated.   4. The mitral valve is degenerative. Mild mitral valve regurgitation.   5. Tricuspid valve regurgitation is mild to moderate.   6. The aortic valve is tricuspid. There is mild calcification of the  aortic valve. There is mild thickening of the aortic valve. Aortic valve  regurgitation is not visualized. Aortic valve sclerosis/calcification is  present, without any evidence of  aortic stenosis.   Comparison(s): Compared to prior TTE in 2019, the patient is now in AFib  and EF appears mildly reduced at 45-50% (previously 60-65%).   FINDINGS   Left Ventricle: Left ventricular ejection fraction, by estimation, is 45  to 50%. The left ventricle has mildly decreased function. The left  ventricle demonstrates global hypokinesis. The left ventricular internal  cavity size was normal in size. There is   borderline concentric left ventricular hypertrophy. Diastolic function  indeterminant due to AFib.   Right Ventricle: The right ventricular size is normal. Right vetricular  wall thickness was not well visualized. Right ventricular systolic  function is normal.   Left Atrium: Left atrial size was mildly dilated.   Right Atrium: Right atrial size was normal in size.   Pericardium: There is no evidence of pericardial effusion.   Mitral Valve: The mitral valve is degenerative in appearance. There is  mild thickening of the mitral valve leaflet(s). There is mild  calcification of the mitral valve  leaflet(s). Mild to moderate mitral  annular calcification. Mild mitral valve  regurgitation.   Tricuspid Valve: The tricuspid valve is normal in structure. Tricuspid  valve regurgitation is mild to moderate.   Aortic Valve: The aortic valve is tricuspid. There is mild calcification  of the aortic valve. There is mild thickening of the aortic valve. Aortic  valve regurgitation is not visualized. Aortic valve  sclerosis/calcification is present, without any  evidence of aortic stenosis.   Pulmonic Valve: The pulmonic valve was normal in structure. Pulmonic valve  regurgitation is trivial.   Aorta: The aortic root and ascending aorta are structurally normal, with  no evidence of dilitation.   IAS/Shunts: No atrial level shunt detected by color flow Doppler.       Recent Labs: 04/24/2023: Hemoglobin 13.7; Platelets 203 08/03/2023: ALT 22; BUN 16; Creatinine, Ser 0.63; Magnesium 1.8; Potassium 5.3; Sodium 139; TSH 2.980  Recent Lipid Panel No results found for: CHOL, TRIG, HDL, CHOLHDL, VLDL, LDLCALC, LDLDIRECT  Physical Exam:    VS:  BP 126/78 (BP Location: Left Arm, Patient Position: Sitting, Cuff Size: Normal)   Pulse 90   Ht 5' 6 (1.676 m)   Wt 186 lb 12.8 oz (84.7 kg)   SpO2 96%   BMI 30.15 kg/m     Wt Readings from Last 3 Encounters:  11/24/23 186 lb 12.8 oz (84.7 kg)  08/03/23 191 lb 6.4 oz (86.8 kg)  06/20/22 191 lb (86.6 kg)     GEN: Well nourished, well developed in no acute distress HEENT: Normal NECK: No JVD; No carotid bruits LYMPHATICS: No lymphadenopathy CARDIAC: S1S2 noted,RRR, no murmurs, rubs, gallops RESPIRATORY:  Clear to auscultation without rales, wheezing or rhonchi  ABDOMEN: Soft, non-tender, non-distended, +bowel sounds, no guarding. EXTREMITIES: Trace nonpitting edema, No cyanosis, no clubbing MUSCULOSKELETAL:  No deformity  SKIN: Warm and dry NEUROLOGIC:  Alert and oriented x 3, non-focal PSYCHIATRIC:  Normal affect, good  insight  ASSESSMENT:    1. Depressed left ventricular ejection fraction   2. Persistent atrial fibrillation (HCC)   3. Primary  hypertension   4. Obstructive sleep apnea syndrome   5. Nonrheumatic tricuspid valve regurgitation     PLAN:    Atrial Fibrillation Recurrent atrial fibrillation with hesitancy towards ablation for rhythm strategy. Emphasized ablation safety and encouraged consideration . I shared with patient again information answered her questions about what the ablation is and what it would do to help her to stay out of atrial fibrillation.  Hypertension Her blood pressure is at target we will continue her olmesartan  20 mg daily.  She is also on Toprol -XL 50 mg twice a day.  Mildly depressed ejection fraction on echocardiogram in 2022.  Continue Toprol  XL as well as her olmesartan .  Will repeat her echocardiogram to assess LVEF as well as the tricuspid regurgitation which was mild to moderate at that time  Sleep Apnea Mild sleep apnea with excessive evening sleepiness.   The patient understands the need to lose weight with diet and exercise. We have discussed specific strategies for this.   The patient is in agreement with the above plan. The patient left the office in stable condition.  The patient will follow up in 12 months and sooner if needed.   Medication Adjustments/Labs and Tests Ordered: Current medicines are reviewed at length with the patient today.  Concerns regarding medicines are outlined above.  Orders Placed This Encounter  Procedures   ECHOCARDIOGRAM COMPLETE   No orders of the defined types were placed in this encounter.   Patient Instructions  Medication Instructions:  Your physician recommends that you continue on your current medications as directed. Please refer to the Current Medication list given to you today.  *If you need a refill on your cardiac medications before your next appointment, please call your  pharmacy*  Testing/Procedures: Your physician has requested that you have an echocardiogram. Echocardiography is a painless test that uses sound waves to create images of your heart. It provides your doctor with information about the size and shape of your heart and how well your heart's chambers and valves are working. This procedure takes approximately one hour. There are no restrictions for this procedure. Please do NOT wear cologne, perfume, aftershave, or lotions (deodorant is allowed). Please arrive 15 minutes prior to your appointment time.  Please note: We ask at that you not bring children with you during ultrasound (echo/ vascular) testing. Due to room size and safety concerns, children are not allowed in the ultrasound rooms during exams. Our front office staff cannot provide observation of children in our lobby area while testing is being conducted. An adult accompanying a patient to their appointment will only be allowed in the ultrasound room at the discretion of the ultrasound technician under special circumstances. We apologize for any inconvenience.   Follow-Up: At [facility], you and your health needs are our priority.  As part of our continuing mission to provide you with exceptional heart care, our providers are all part of one team.  This team includes your primary Cardiologist (physician) and Advanced Practice Providers or APPs (Physician Assistants and Nurse Practitioners) who all work together to provide you with the care you need, when you need it.  Your next appointment:   9 month(s)  Provider:   Veleka Djordjevic, DO       Adopting a Healthy Lifestyle.  Know what a healthy weight is for you (roughly BMI <25) and aim to maintain this   Aim for 7+ servings of fruits and vegetables daily   65-80+ fluid ounces of water  or unsweet  tea for healthy kidneys   Limit to max 1 drink of alcohol per day; avoid smoking/tobacco   Limit animal fats in diet for  cholesterol and heart health - choose grass fed whenever available   Avoid highly processed foods, and foods high in saturated/trans fats   Aim for low stress - take time to unwind and care for your mental health   Aim for 150 min of moderate intensity exercise weekly for heart health, and weights twice weekly for bone health   Aim for 7-9 hours of sleep daily   When it comes to diets, agreement about the perfect plan isnt easy to find, even among the experts. Experts at the [facility] [employer] developed an idea known as the Healthy Eating Plate. Just imagine a plate divided into logical, healthy portions.   The emphasis is on diet quality:   Load up on vegetables and fruits - one-half of your plate: Aim for color and variety, and remember that potatoes dont count.   Go for whole grains - one-quarter of your plate: Whole wheat, barley, wheat berries, quinoa, oats, brown rice, and foods made with them. If you want pasta, go with whole wheat pasta.   Protein power - one-quarter of your plate: Fish, chicken, beans, and nuts are all healthy, versatile protein sources. Limit red meat.   The diet, however, does go beyond the plate, offering a few other suggestions.   Use healthy plant oils, such as olive, canola, soy, corn, sunflower and peanut. Check the labels, and avoid partially hydrogenated oil, which have unhealthy trans fats.   If youre thirsty, drink water . Coffee and tea are good in moderation, but skip sugary drinks and limit milk and dairy products to one or two daily servings.   The type of carbohydrate in the diet is more important than the amount. Some sources of carbohydrates, such as vegetables, fruits, whole grains, and beans-are healthier than others.   Finally, stay active  Signed, Dub Huntsman, DO  11/26/2023 3:03 PM    [facility]

## 2023-11-29 ENCOUNTER — Ambulatory Visit (INDEPENDENT_AMBULATORY_CARE_PROVIDER_SITE_OTHER): Admitting: Otolaryngology

## 2023-11-29 ENCOUNTER — Encounter (INDEPENDENT_AMBULATORY_CARE_PROVIDER_SITE_OTHER): Payer: Self-pay | Admitting: Otolaryngology

## 2023-11-29 VITALS — BP 117/80 | HR 80

## 2023-11-29 DIAGNOSIS — J310 Chronic rhinitis: Secondary | ICD-10-CM

## 2023-11-29 DIAGNOSIS — J342 Deviated nasal septum: Secondary | ICD-10-CM

## 2023-11-29 DIAGNOSIS — H6993 Unspecified Eustachian tube disorder, bilateral: Secondary | ICD-10-CM

## 2023-11-29 DIAGNOSIS — J343 Hypertrophy of nasal turbinates: Secondary | ICD-10-CM

## 2023-11-29 DIAGNOSIS — R0982 Postnasal drip: Secondary | ICD-10-CM

## 2023-11-29 DIAGNOSIS — R0981 Nasal congestion: Secondary | ICD-10-CM

## 2023-11-29 MED ORDER — FLUTICASONE PROPIONATE 50 MCG/ACT NA SUSP: 2.0000 | Freq: Every day | NASAL | 10 refills | Status: AC

## 2023-11-29 MED ORDER — IPRATROPIUM BROMIDE 0.06 % NA SOLN
2.0000 | Freq: Two times a day (BID) | NASAL | 12 refills | Status: AC | PRN
Start: 2023-11-29 — End: 2023-12-29

## 2023-11-29 NOTE — Progress Notes (Unsigned)
 Patient ID: Bethany Frederick, female   DOB: 05-29-1950, 73 y.o.   MRN: [id number]  Follow-up: Chronic nasal congestion, nasal drainage  HPI: The patient is a 73 year old female who returns today for follow-up evaluation.  She was previously seen for chronic nasal congestion and chronic nasal drainage.  She was noted to have nasal septal deviation and bilateral inferior turbinate hypertrophy.  She was treated with Flonase  and Atrovent  nasal sprays.  The patient returns today complaining of occasional nasal drainage and popping sensation in her ears.  Her nasal breathing has improved.  She denies any recent sinusitis.  Currently she denies any facial pain, fever, or visual change.  Exam: General: Communicates without difficulty, well nourished, no acute distress. Head: Normocephalic, no evidence injury, no tenderness, facial buttresses intact without stepoff. Face/sinus: No tenderness to palpation and percussion. Facial movement is normal and symmetric. Eyes: PERRL, EOMI. No scleral icterus, conjunctivae clear. Neuro: CN II exam reveals vision grossly intact.  No nystagmus at any point of gaze. Ears: Auricles well formed without lesions.  Ear canals are intact without mass or lesion.  No erythema or edema is appreciated.  The TMs are intact without fluid. Nose: External evaluation reveals normal support and skin without lesions.  Dorsum is intact.  Anterior rhinoscopy reveals congested mucosa over anterior aspect of inferior turbinates and intact septum.  No purulence noted. Oral:  Oral cavity and oropharynx are intact, symmetric, without erythema or edema.  Mucosa is moist without lesions. Neck: Full range of motion without pain.  There is no significant lymphadenopathy.  No masses palpable.  Thyroid  bed within normal limits to palpation.  Parotid glands and submandibular glands equal bilaterally without mass.  Trachea is midline. Neuro:  CN 2-12 grossly intact.   Assessment: 1.  Chronic rhinitis with nasal  mucosal congestion, nasal septal deviation, and bilateral inferior turbinate hypertrophy. 2.  Chronic postnasal drainage. 3.  Bilateral eustachian tube dysfunction.  Plan: 1.  The physical exam findings are reviewed with the patient. 2.  The patient is reassured that no acute infection is noted today. 3.  Continue with Flonase  and Atrovent  nasal sprays.  Refills are provided to the patient. 4.  Valsalva exercise multiple times a day. 5.  The patient is encouraged to call with any questions or concerns.

## 2023-11-30 DIAGNOSIS — R0982 Postnasal drip: Secondary | ICD-10-CM | POA: Insufficient documentation

## 2023-11-30 DIAGNOSIS — J310 Chronic rhinitis: Secondary | ICD-10-CM | POA: Insufficient documentation

## 2023-11-30 DIAGNOSIS — J343 Hypertrophy of nasal turbinates: Secondary | ICD-10-CM | POA: Insufficient documentation

## 2023-11-30 DIAGNOSIS — J342 Deviated nasal septum: Secondary | ICD-10-CM | POA: Insufficient documentation

## 2023-12-09 ENCOUNTER — Other Ambulatory Visit (HOSPITAL_COMMUNITY): Payer: Self-pay | Admitting: Cardiology

## 2023-12-09 DIAGNOSIS — I4819 Other persistent atrial fibrillation: Secondary | ICD-10-CM

## 2023-12-18 ENCOUNTER — Telehealth: Payer: Self-pay | Admitting: Cardiology

## 2023-12-18 DIAGNOSIS — I4819 Other persistent atrial fibrillation: Secondary | ICD-10-CM

## 2023-12-18 MED ORDER — APIXABAN 5 MG PO TABS: 5.0000 mg | ORAL_TABLET | Freq: Two times a day (BID) | ORAL | 0 refills | Status: DC

## 2023-12-18 NOTE — Telephone Encounter (Signed)
 Prescription refill request for Eliquis  received. Indication: Afib  Last office visit: 11/24/23 ([place])  Scr: 0.63 (08/03/23)  Age: 73 Weight: 84.7kg  Appropriate dose. Refill sent.

## 2023-12-18 NOTE — Telephone Encounter (Signed)
*  STAT* If patient is at the pharmacy, call can be transferred to refill team.   1. Which medications need to be refilled? (please list name of each medication and dose if known)  ELIQUIS  5 MG TABS tablet   2. Which pharmacy/location (including street and city if local pharmacy) is medication to be sent to? [facility] - [address], [place], [place] [address]  3. Do they need a 30 day or 90 day supply?  Patient is out of town and out of medication. She is requesting a 1 week supply to last her until she returns home.

## 2023-12-27 ENCOUNTER — Telehealth: Payer: Self-pay | Admitting: Cardiology

## 2023-12-27 DIAGNOSIS — I4819 Other persistent atrial fibrillation: Secondary | ICD-10-CM

## 2023-12-27 MED ORDER — APIXABAN 5 MG PO TABS
5.0000 mg | ORAL_TABLET | Freq: Two times a day (BID) | ORAL | 1 refills | Status: AC
Start: 2023-12-27 — End: ?

## 2023-12-27 NOTE — Telephone Encounter (Signed)
*  STAT* If patient is at the pharmacy, call can be transferred to refill team.   1. Which medications need to be refilled? (please list name of each medication and dose if known)  apixaban  (ELIQUIS ) 5 MG TABS tablet    2. Would you like to learn more about the convenience, safety, & potential cost savings by using the [facility]?    3. Are you open to using the [facility] (Type [facility].  ).   4. Which pharmacy/location (including street and city if local pharmacy) is medication to be sent to? [id number] [place], [place] - [address] [place] [facility]    5. Do they need a 30 day or 90 day supply? 90 day

## 2023-12-27 NOTE — Telephone Encounter (Signed)
 Prescription refill request for Eliquis  received. Indication: Afib  Last office visit: 11/24/23 ([place])  Scr: 0.63 (08/03/23)  Age: 73 Weight: 84.7kg  Appropriate dose. Refill sent.

## 2024-01-02 ENCOUNTER — Ambulatory Visit (HOSPITAL_COMMUNITY)
Admission: RE | Admit: 2024-01-02 | Discharge: 2024-01-02 | Disposition: A | Discharge status: Home / Self Care | Source: Ambulatory Visit | Attending: Cardiovascular Disease | Admitting: Cardiovascular Disease

## 2024-01-02 DIAGNOSIS — I340 Nonrheumatic mitral (valve) insufficiency: Secondary | ICD-10-CM | POA: Insufficient documentation

## 2024-01-02 DIAGNOSIS — R931 Abnormal findings on diagnostic imaging of heart and coronary circulation: Secondary | ICD-10-CM | POA: Diagnosis not present

## 2024-01-02 DIAGNOSIS — I361 Nonrheumatic tricuspid (valve) insufficiency: Secondary | ICD-10-CM | POA: Insufficient documentation

## 2024-01-02 LAB — ECHOCARDIOGRAM COMPLETE
Area-P 1/2: 3.5 cm2
MV M vel: 5.54 m/s
MV Peak grad: 122.8 mmHg
Radius: 0.55 cm
S' Lateral: 2.8 cm

## 2024-01-23 ENCOUNTER — Ambulatory Visit: Payer: Self-pay | Admitting: Cardiology

## 2024-02-20 ENCOUNTER — Other Ambulatory Visit: Payer: Self-pay

## 2024-02-20 MED ORDER — FUROSEMIDE 40 MG PO TABS: ORAL_TABLET | ORAL | 3 refills | Status: AC

## 2024-02-20 MED ORDER — POTASSIUM CHLORIDE CRYS ER 20 MEQ PO TBCR: EXTENDED_RELEASE_TABLET | ORAL | 3 refills | Status: AC

## 2024-02-20 NOTE — Progress Notes (Signed)
 Lasix 40 mg, Potassium 20 mEq sent to preferred pharmacy on file.

## 2024-03-11 ENCOUNTER — Telehealth: Payer: Self-pay | Admitting: Cardiology

## 2024-03-11 NOTE — Telephone Encounter (Signed)
 Spoke with pt who reports she has noticed on the days she take Furosemide she typically feels more tired and notes that her BP is below 100/60 in the afternoon/evenings.  She denies CP, SOB or dizziness.  Pt states she did drink some extra water  and ate a snack and her BP returned to normal shortly after.. Pt advised she does need to make sure she hydrates with water  on the days she takes Furosemide and KCL.  She may have a small salty snack to also help with increasing BP.  She is scheduled to have labs on 03/26/2024 with her PCP office. Encouraged to continue monitoring BP 2 hours after morning medications and keep a BP log.  Will forward to Dr Sheena for further review and recommendation.  Pt verbalizes understanding and agrees with current plan.

## 2024-03-11 NOTE — Telephone Encounter (Signed)
 Pt c/o BP issue: STAT if pt c/o blurred vision, one-sided weakness or slurred speech.  STAT if BP is GREATER than 180/120 TODAY.  STAT if BP is LESS than 90/60 and SYMPTOMATIC TODAY  1. What is your BP concern? Pt concerned her bp was low yesterday   2. Have you taken any BP medication today?  Yes   3. What are your last 5 BP readings?   122/63 hr 83 (today)  117/68  88/44 (last night after dinner)    4. Are you having any other symptoms (ex. Dizziness, headache, blurred vision, passed out)? No

## 2024-03-14 NOTE — Telephone Encounter (Signed)
 Spoke with patient and shared message from Dr. Sheena:  Please have her hold the lasix for now   Patient verbalized understanding and will continue to monitor BP.

## 2024-03-18 ENCOUNTER — Telehealth: Payer: Self-pay | Admitting: Cardiology

## 2024-03-18 NOTE — Telephone Encounter (Signed)
 Bethany Frederick with pt's pcp office [facility]) called in asking if pts echo from 01/02/24 can be faxed over. Please advise.   [phone] attn: Bethany Frederick
